# Patient Record
Sex: Female | Born: 1935 | Race: White | Hispanic: No | Smoking: Former smoker
Health system: Southern US, Community
[De-identification: ages and names within clinical notes are randomized; demographics above are authoritative.]

## PROBLEM LIST (undated history)

## (undated) DIAGNOSIS — E559 Vitamin D deficiency, unspecified: Secondary | ICD-10-CM

## (undated) DIAGNOSIS — R5383 Other fatigue: Secondary | ICD-10-CM

## (undated) DIAGNOSIS — R5381 Other malaise: Secondary | ICD-10-CM

## (undated) DIAGNOSIS — F22 Delusional disorders: Principal | ICD-10-CM

## (undated) DIAGNOSIS — M810 Age-related osteoporosis without current pathological fracture: Secondary | ICD-10-CM

## (undated) DIAGNOSIS — K5900 Constipation, unspecified: Secondary | ICD-10-CM

## (undated) DIAGNOSIS — E785 Hyperlipidemia, unspecified: Secondary | ICD-10-CM

## (undated) DIAGNOSIS — R413 Other amnesia: Secondary | ICD-10-CM

## (undated) DIAGNOSIS — M542 Cervicalgia: Secondary | ICD-10-CM

## (undated) DIAGNOSIS — S42209A Unspecified fracture of upper end of unspecified humerus, initial encounter for closed fracture: Secondary | ICD-10-CM

## (undated) DIAGNOSIS — R21 Rash and other nonspecific skin eruption: Secondary | ICD-10-CM

## (undated) DIAGNOSIS — R0602 Shortness of breath: Secondary | ICD-10-CM

## (undated) DIAGNOSIS — E039 Hypothyroidism, unspecified: Secondary | ICD-10-CM

## (undated) DIAGNOSIS — M549 Dorsalgia, unspecified: Secondary | ICD-10-CM

## (undated) DIAGNOSIS — I10 Essential (primary) hypertension: Secondary | ICD-10-CM

## (undated) DIAGNOSIS — H9190 Unspecified hearing loss, unspecified ear: Secondary | ICD-10-CM

## (undated) DIAGNOSIS — B0229 Other postherpetic nervous system involvement: Secondary | ICD-10-CM

## (undated) HISTORY — DX: Delusional disorders: F22

## (undated) HISTORY — DX: Dorsalgia, unspecified: M54.9

## (undated) HISTORY — DX: Constipation, unspecified: K59.00

## (undated) HISTORY — DX: Vitamin D deficiency, unspecified: E55.9

## (undated) HISTORY — DX: Other postherpetic nervous system involvement: B02.29

## (undated) HISTORY — DX: Other amnesia: R41.3

## (undated) HISTORY — PX: CATARACT EXTRACTION, BILATERAL: SHX1313

## (undated) HISTORY — DX: Essential (primary) hypertension: I10

## (undated) HISTORY — DX: Unspecified fracture of upper end of unspecified humerus, initial encounter for closed fracture: S42.209A

## (undated) HISTORY — DX: Other malaise: R53.81

## (undated) HISTORY — DX: Hyperlipidemia, unspecified: E78.5

## (undated) HISTORY — DX: Rash and other nonspecific skin eruption: R21

## (undated) HISTORY — DX: Age-related osteoporosis without current pathological fracture: M81.0

## (undated) HISTORY — DX: Hypothyroidism, unspecified: E03.9

## (undated) HISTORY — DX: Shortness of breath: R06.02

## (undated) HISTORY — DX: Other fatigue: R53.83

## (undated) HISTORY — DX: Cervicalgia: M54.2

## (undated) HISTORY — DX: Unspecified hearing loss, unspecified ear: H91.90

---

## 1957-10-14 HISTORY — PX: KIDNEY STONE SURGERY: SHX686

## 1989-10-14 HISTORY — PX: SPINE SURGERY: SHX786

## 1994-10-14 HISTORY — PX: SPINE SURGERY: SHX786

## 1998-01-12 ENCOUNTER — Encounter: Admission: RE | Admit: 1998-01-12 | Discharge: 1998-04-12 | Payer: Self-pay | Admitting: Orthopaedic Surgery

## 1998-10-14 HISTORY — PX: TOTAL KNEE ARTHROPLASTY: SHX125

## 1999-01-23 ENCOUNTER — Other Ambulatory Visit: Admission: RE | Admit: 1999-01-23 | Discharge: 1999-01-23 | Payer: Self-pay | Admitting: Internal Medicine

## 2000-02-22 ENCOUNTER — Other Ambulatory Visit: Admission: RE | Admit: 2000-02-22 | Discharge: 2000-02-22 | Payer: Self-pay | Admitting: Internal Medicine

## 2000-03-21 ENCOUNTER — Encounter: Payer: Self-pay | Admitting: Internal Medicine

## 2000-03-21 ENCOUNTER — Encounter: Admission: RE | Admit: 2000-03-21 | Discharge: 2000-03-21 | Payer: Self-pay | Admitting: Internal Medicine

## 2001-03-23 ENCOUNTER — Encounter: Admission: RE | Admit: 2001-03-23 | Discharge: 2001-03-23 | Payer: Self-pay | Admitting: Internal Medicine

## 2001-03-23 ENCOUNTER — Encounter: Payer: Self-pay | Admitting: Internal Medicine

## 2001-09-24 ENCOUNTER — Encounter: Admission: RE | Admit: 2001-09-24 | Discharge: 2001-11-13 | Payer: Self-pay | Admitting: Internal Medicine

## 2002-03-24 ENCOUNTER — Encounter: Payer: Self-pay | Admitting: Internal Medicine

## 2002-03-24 ENCOUNTER — Encounter: Admission: RE | Admit: 2002-03-24 | Discharge: 2002-03-24 | Payer: Self-pay | Admitting: Internal Medicine

## 2002-09-08 ENCOUNTER — Encounter: Payer: Self-pay | Admitting: Orthopedic Surgery

## 2002-09-15 ENCOUNTER — Inpatient Hospital Stay (HOSPITAL_COMMUNITY): Admission: RE | Admit: 2002-09-15 | Discharge: 2002-09-20 | Payer: Self-pay | Admitting: Orthopedic Surgery

## 2002-09-15 HISTORY — PX: TOTAL KNEE ARTHROPLASTY: SHX125

## 2003-02-28 ENCOUNTER — Other Ambulatory Visit: Admission: RE | Admit: 2003-02-28 | Discharge: 2003-02-28 | Payer: Self-pay | Admitting: Internal Medicine

## 2003-03-28 ENCOUNTER — Encounter: Admission: RE | Admit: 2003-03-28 | Discharge: 2003-03-28 | Payer: Self-pay | Admitting: Internal Medicine

## 2003-03-28 ENCOUNTER — Encounter: Payer: Self-pay | Admitting: Internal Medicine

## 2004-03-05 ENCOUNTER — Other Ambulatory Visit: Admission: RE | Admit: 2004-03-05 | Discharge: 2004-03-05 | Payer: Self-pay | Admitting: Internal Medicine

## 2004-03-28 ENCOUNTER — Encounter: Admission: RE | Admit: 2004-03-28 | Discharge: 2004-03-28 | Payer: Self-pay | Admitting: Internal Medicine

## 2004-06-02 ENCOUNTER — Emergency Department (HOSPITAL_COMMUNITY): Admission: EM | Admit: 2004-06-02 | Discharge: 2004-06-02 | Payer: Self-pay | Admitting: Emergency Medicine

## 2004-06-21 ENCOUNTER — Encounter: Admission: RE | Admit: 2004-06-21 | Discharge: 2004-06-21 | Payer: Self-pay | Admitting: Specialist

## 2004-10-14 HISTORY — PX: BACK SURGERY: SHX140

## 2005-03-20 ENCOUNTER — Inpatient Hospital Stay (HOSPITAL_COMMUNITY): Admission: RE | Admit: 2005-03-20 | Discharge: 2005-03-26 | Payer: Self-pay | Admitting: Specialist

## 2005-03-20 ENCOUNTER — Ambulatory Visit: Payer: Self-pay | Admitting: Physical Medicine & Rehabilitation

## 2005-03-20 HISTORY — PX: LAMINECTOMY: SHX219

## 2005-06-05 DIAGNOSIS — E039 Hypothyroidism, unspecified: Secondary | ICD-10-CM

## 2005-06-05 HISTORY — DX: Hypothyroidism, unspecified: E03.9

## 2005-06-27 ENCOUNTER — Encounter: Admission: RE | Admit: 2005-06-27 | Discharge: 2005-06-27 | Payer: Self-pay | Admitting: *Deleted

## 2005-09-12 DIAGNOSIS — M542 Cervicalgia: Secondary | ICD-10-CM

## 2005-09-12 HISTORY — DX: Cervicalgia: M54.2

## 2006-07-03 ENCOUNTER — Encounter: Admission: RE | Admit: 2006-07-03 | Discharge: 2006-07-03 | Payer: Self-pay | Admitting: *Deleted

## 2006-07-09 DIAGNOSIS — M810 Age-related osteoporosis without current pathological fracture: Secondary | ICD-10-CM

## 2006-07-09 HISTORY — DX: Age-related osteoporosis without current pathological fracture: M81.0

## 2007-07-07 ENCOUNTER — Encounter: Admission: RE | Admit: 2007-07-07 | Discharge: 2007-07-07 | Payer: Self-pay | Admitting: *Deleted

## 2007-11-02 DIAGNOSIS — R21 Rash and other nonspecific skin eruption: Secondary | ICD-10-CM

## 2007-11-02 DIAGNOSIS — I10 Essential (primary) hypertension: Secondary | ICD-10-CM | POA: Insufficient documentation

## 2007-11-02 DIAGNOSIS — R0602 Shortness of breath: Secondary | ICD-10-CM

## 2007-11-02 HISTORY — DX: Essential (primary) hypertension: I10

## 2007-11-02 HISTORY — DX: Shortness of breath: R06.02

## 2007-11-02 HISTORY — DX: Rash and other nonspecific skin eruption: R21

## 2008-07-07 ENCOUNTER — Encounter: Admission: RE | Admit: 2008-07-07 | Discharge: 2008-07-07 | Payer: Self-pay | Admitting: Internal Medicine

## 2008-08-29 ENCOUNTER — Emergency Department (HOSPITAL_COMMUNITY): Admission: EM | Admit: 2008-08-29 | Discharge: 2008-08-29 | Payer: Self-pay | Admitting: Emergency Medicine

## 2008-09-12 DIAGNOSIS — B0229 Other postherpetic nervous system involvement: Secondary | ICD-10-CM

## 2008-09-12 HISTORY — DX: Other postherpetic nervous system involvement: B02.29

## 2008-12-05 DIAGNOSIS — H9190 Unspecified hearing loss, unspecified ear: Secondary | ICD-10-CM

## 2008-12-05 HISTORY — DX: Unspecified hearing loss, unspecified ear: H91.90

## 2009-04-10 DIAGNOSIS — M549 Dorsalgia, unspecified: Secondary | ICD-10-CM

## 2009-04-10 HISTORY — DX: Dorsalgia, unspecified: M54.9

## 2009-07-10 DIAGNOSIS — E785 Hyperlipidemia, unspecified: Secondary | ICD-10-CM | POA: Insufficient documentation

## 2009-07-10 HISTORY — DX: Hyperlipidemia, unspecified: E78.5

## 2009-10-09 DIAGNOSIS — R5381 Other malaise: Secondary | ICD-10-CM

## 2009-10-09 HISTORY — DX: Other malaise: R53.81

## 2010-07-17 ENCOUNTER — Encounter: Admission: RE | Admit: 2010-07-17 | Discharge: 2010-07-17 | Payer: Self-pay | Admitting: Internal Medicine

## 2010-11-04 ENCOUNTER — Encounter: Payer: Self-pay | Admitting: Specialist

## 2011-03-01 NOTE — H&P (Signed)
NAME:  [name], [name] NO.:  [id number]   MEDICAL RECORD NO.:  [id number]                   PATIENT TYPE:  INP   LOCATION:  0455                                 FACILITY:  [facility]   PHYSICIAN:  Ollen Gross, M.D.                 DATE OF BIRTH:  1927-07-23   DATE OF ADMISSION:  09/15/2002  DATE OF DISCHARGE:                                HISTORY & PHYSICAL   CHIEF COMPLAINT:  Left knee pain.   HISTORY OF PRESENT ILLNESS:  The patient is a 77 year old female who has  been seen by Dr. Lequita Halt for severe left knee pain.  It has been ongoing for  four years now but progressively worse over the past two to three.  She has  undergone Synvisc a couple of times.  It worked during the first trial;  however, the second trial did not help at all.  She has reached a point  where her knee is hurting all the time and starting to interfere with her  daily activities.  It is felt she would benefit from undergoing a total knee  replacement.  The risks and benefits were discussed.  She was subsequently  admitted to the hospital.   ALLERGIES:  Multiple allergies.  She said MANY NARCOTICS cause  disorientation and combativeness.  Also, allergies include PENICILLIN,  MORPHINE, FOSAMAX, TOBRADEX, MIACALCIN, HIGH-DOSE PERCOCET.   CURRENT MEDICATIONS:  1. Levoxyl 88 mcg daily.  2. Actonel 35 mg weekly.  3. Calcium 600 + D daily.  4. Vision formula supplement with lutein and zinc and antioxidants.  5. Vitamin A.  6. Vioxx 1/8 of a tablet daily.   PAST MEDICAL HISTORY:  1. Pneumonia in 1991.  2. Reflux disease.  3. Renal calculi.  4. Hypothyroidism.  5. Osteoporosis.  6. History of metatarsal fractures.   PAST SURGICAL HISTORY:  1. Kidney stone excision in 1959.  2. Spinal fusion September 1990.  3. Wire removal from her fusion September 1991.  4. Spinal fusion in 1997.  5. Right knee replacement in 1999.   SOCIAL HISTORY:  Married.  Nonsmoker.  Two glasses of  wine with dinner.  Three children.  She has a two-story home with 15 steps.   FAMILY HISTORY:  Father and three brothers with heart disease.  Two brothers  who have had open-heart surgery.  Mother and one brother with arthritis.  She has a sister with lung cancer.   REVIEW OF SYSTEMS:  GENERAL:  No fevers, chills, or night sweats.  NEUROLOGIC:  No seizures, syncope, or paralysis.  RESPIRATORY:  No shortness  of breath, productive cough, or hemoptysis.  CARDIOVASCULAR:  No chest pain,  angina, or orthopnea.  GASTROINTESTINAL:  She does have some reflux disease.  No nausea, vomiting, diarrhea, or constipation.  GENITOURINARY:  She has had  some renal calculi, one removed back in 1959.  No dysuria, hematuria, or  discharge.  MUSCULOSKELETAL:  Pertinent to  the left knee found in the  history of present illness.   PHYSICAL EXAMINATION:  VITAL SIGNS:  Pulse 62, respirations 14, blood  pressure 138/78.  GENERAL:  The patient is a 77 year old female.  Well-nourished, well-  developed.  Appears to be in no acute distress.  She is alert and oriented  and cooperative.  HEENT:  Normocephalic, atraumatic.  Pupils are round and reactive.  EOMs are  intact.  NECK:  Supple.  CHEST:  Clear to auscultation anterior and posterior chest wall.  HEART:  Regular rate and rhythm.  No murmurs.  ABDOMEN:  Soft and nontender.  Bowel sounds present.  RECTAL, BREASTS, GENITOURINARY:  Not done.  Not pertinent to present  illness.  EXTREMITIES:  Left lower extremity, left knee range of motion of 10-120  degrees.  Slight varus alignment.  Moderate crepitus.  Positive effusion.   IMPRESSION:  1. Osteoarthritis left knee.  2. History of pneumonia in 1991.  3. Reflux disease.  4. History of renal calculi.  5. Osteoporosis.  6. Hypothyroidism.   PLAN:  The patient will be admitted to [facility] to undergo a  left total knee replacement arthroplasty.  Surgery will be performed by Dr.  Ollen Gross.     Alexzandrew L. Julien Girt, P.A.              Ollen Gross, M.D.    ALP/MEDQ  D:  09/19/2002  T:  09/19/2002  Job:  [id number]

## 2011-03-01 NOTE — Discharge Summary (Signed)
NAME:  [name], [name] NO.:  [id number]   MEDICAL RECORD NO.:  [id number]                   PATIENT TYPE:  INP   LOCATION:  0455                                 FACILITY:  [facility]   PHYSICIAN:  Ollen Gross, M.D.                 DATE OF BIRTH:  21-Dec-1926   DATE OF ADMISSION:  09/15/2002  DATE OF DISCHARGE:  09/20/2002                                 DISCHARGE SUMMARY   ADMISSION DIAGNOSES:  1. Osteoarthritis, left knee.  2. History of pneumonia in 1991.  3. Reflux disease.  4. History of renal calculi.  5. Osteoporosis.  6. Hypothyroidism.   DISCHARGE DIAGNOSES:  1. Osteoarthritis, left knee status post left total knee replacement     arthroplasty.  2. History of pneumonia in 1991.  3. Reflux disease.  4. History of renal calculi.  5. Osteoporosis.  6. Hypothyroidism.   PROCEDURE:  The patient was taken to the OR on September 15, 2002 and  underwent a left total knee replacement arthroplasty. Surgeon, Dr. Homero Fellers  Aluisio. Assistant, Avel Peace, P.A.-C. Surgery under general anesthesia  with minimal blood loss. Hemovac drain x1. Tourniquet time 45 minutes at 300  mmHg.   CONSULTATIONS:  Rehabilitation services.   BRIEF HISTORY:  The patient is a 77 year old female seen by Dr. Lequita Halt for  severe left knee pain that has been ongoing for approximately four years  now; however, it has been the worst over the past 2-3 years. She has  undergone Synvisc trials in the past. First she got a good response with the  first trial, her second Synvisc trial did not help. She has reached a point  now that her knee is hurting all the time and it started interfering with  her daily activities. She is seen in the office and found to have severe  osteoarthritis and it is felt that she would benefit from undergoing knee  replacement. The risks and benefits were discussed and she was subsequently  admitted to the hospital.   LABORATORY DATA:  CBC on admission  showed a hemoglobin of 14.2, hematocrit  of 41.8, white cell count of 6.5, red cell count 4.82. Postop H&H 10.6 and  30.9. Last noted H&H 10.7 and 31.6. Differential on the admission CBC all  within normal limits. PT and PTT on admission were 13.0 and 30 respectively  with an INR of 0.9. Serial pro times followed per Coumadin protocol. Last  noted PT and INR 20.9 and 2.0. Chem panel all within normal limits on  admission. Follow-up BMET showed an increasing glucose from 94 to 139 and  the last BMET within normal limits. Last BMET showed a mild drop in calcium  from 9.6 to 8.3. Urinalysis on admission only showed trace ketones,  otherwise, negative. Blood group type O positive.   EKG dated September 08, 2002, sinus bradycardia, otherwise, normal confirmed  by Dr. Viann Fish. Preop chest x-ray mild bronchitic changes.  HOSPITAL COURSE:  The patient was admitted to [name], taken to  the OR and underwent the above stated procedure without complication. The  patient tolerated the procedure well, later transferred to the recovery room  and then to the orthopedic floor for continued postop care. Placed  weightbearing as tolerated. Initially placed on PCA Dilaudid for pain  control. Also given 24 hours of postop IV antibiotics and placed on Coumadin  for DVT prophylaxis. Hemovac drain placed at the time of surgery and was  pulled on postoperative day one. She also had an inflow lidocaine pain pump  placed at the time of surgery. That inflow catheter was left in until  postoperative day two. She did have some mild positive fluid balance and  underwent some mild diuresis.  I&O's improved following diuresis. Day two  she had the inflow pain pump drain pulled, dressing was changed, incision  was healing well. PCA and Foley was discontinued at that time. PT and OT was  consulted postop to assist with total knee protocol gait training and  ambulation. She did very well with physical  therapy up ambulating greater  than 100 feet by postoperative day two and increased even up to 175 feet by  postoperative day three. She continued to progress very well, she was weaned  over to p.o. medications. By day five, she was doing quite well. She had  been seen in rounds by Dr. Lequita Halt on September 20, 2002, tolerating her  medications and was discharged home.   DISCHARGE PLAN:  1. The patient was discharged home on September 20, 2002.  2. For discharge diagnoses please see above.  3. Discharge medications: Coumadin, Percocet and Robaxin.  4. Diet as tolerated.  5. Activity is weightbearing as tolerated.  6. Home health PT and home health nursing.  7. Follow-up, the patient is to follow-up on Thursdays, September 30, 2002.   DISPOSITION:  Home.   CONDITION ON DISCHARGE:  Improved.     Alexzandrew L. Julien Girt, P.A.              Ollen Gross, M.D.    ALP/MEDQ  D:  10/26/2002  T:  10/26/2002  Job:  [id number]

## 2011-03-01 NOTE — Op Note (Signed)
NAME:  [name], [name] NO.:  [id number]   MEDICAL RECORD NO.:  [id number]          PATIENT TYPE:  INP   LOCATION:  5035                         FACILITY:  [facility]   PHYSICIAN:  Kerrin Champagne, M.D.   DATE OF BIRTH:  1927-07-29   DATE OF PROCEDURE:  03/20/2005  DATE OF DISCHARGE:                                 OPERATIVE REPORT   PREOPERATIVE DIAGNOSES:  1.  Lumbar spinal stenosis, L3-4, with bilateral lateral recess stenosis and      foraminal entrapment involving the L3 and L4 nerve roots with a grade 1      degenerative spondylolisthesis above a previous L4-5 posterolateral      fusion with a Isola rods and pedicle screw instrumentation.  2.  Probable left L5-S1 herniated nucleus pulposus.   POSTOPERATIVE DIAGNOSES:  1.  Lumbar spinal stenosis, L3-4, with bilateral lateral recess stenosis and      foraminal entrapment involving the L3 and L4 nerve roots with a grade 1      degenerative spondylolisthesis above a previous L4-5 posterolateral      fusion with a Isola rods and pedicle screw instrumentation.  2.  The patient was found to have a left-sided L5-S1 herniated nucleus      pulposus causing left S1 nerve root compression.   PROCEDURE:  1.  Central laminectomy at L3-4 with bilateral L3 and L4 nerve root      decompression.  2.  Removal of Isola rods and pedicle screws at the L4-5 level.  3.  Left L3-4 transverse lumbar interbody fusion using a DePuy 8-mm Leopard      cage with local and Symphony bone graft.  4.  Posterolateral fusion, L3 to L4, with combination of local and Symphony      bone graft material.  5.  Removal of Isola rods and pedicle screws at the L4-5 level, then      instrumentation posteriorly segmentally from L3 to L5 using Monarch      pedicle screws and rods.  6.  Left-sided L5-S1 microdiskectomy.   SURGEON:  Kerrin Champagne, M.D.   ASSISTANT:  Maud Deed, P.A.-C.   ANESTHESIA:  GOT.   ANESTHESIOLOGIST:  Burna Forts,  M.D.   ESTIMATED BLOOD LOSS:  250 mL; Cell Saver was used, but no blood was  returned.   DRAINS:  Foley to straight drain; Hemovac, right lower lumbar.   BRIEF CLINICAL HISTORY:  This patient is a 77 year old female who I have  treated previously for a lumbar spondylolisthesis at the L4-5 level with  decompression and fusion without instrumentation, later revised the fusion  with instrumentation.  She did well for many years and now has returned with  increasing back pain with radiation to the left leg, pain with both standing  and walking, pain with sitting, bending or stooping, sciatic tension signs  as well as claudication signs.  Her x-rays have demonstrated a  spondylolisthesis above the previous fusion site at the L3-4 level, above  the previous L4-5 level fusion site.  Her studies have shown a finding of  entrapment of the L3 nerve roots secondary to  spondylolisthesis as well as  lateral recess stenosis, though severe at the L3-4 level above the previous  fusion site, L4-5.  On reviewing her myelogram/post myelogram CT scan, it is  apparent that she may very well have a disk herniation at the L5-S1 level on  the left side.  Her pain pattern is an S1 distribution when she is sitting  or when her leg is raised on the left side.  She has diminished left ankle  jerk, but then neurogenic claudication with standing and ambulating,  indicating that she also has problems relative to the spondylolisthesis  above her previous fusion site.   INTRAOPERATIVE FINDINGS:  Left L5-S1 HNP with left S1 nerve root  compression.  Spondylolisthesis with bilateral L3 nerve root entrapment of  bilateral L4 nerve root entrapment at the L3-4 level.  Solid fusion, L4-5.   DESCRIPTION OF PROCEDURE:  After adequate general anesthesia, the patient in  a prone position, a Jackson frame was used.  Standard preoperative  antibiotics, all pressure points well-padded, chest rolls were used and  Foley catheter  was placed prior to the patient being returned to a prone  position.  TED hose to prevent DVT.  The patient with previous total knee  arthroplasties and for this reason, also to restore lordosis of the lumbar  spine, the Andrews frame was not used.  Cell Saver was used during the  procedure; the patient had sterile withdrawal of blood, 60 mL, for the  Symphony bone graft procedure by the anesthesiologist.  Standard prep with  DuraPrep solution from lower dorsal spine to the mid-sacral levels, draped  in the usual manner; iodine Vi Drape was used.  Incision made ellipsing the  old incision scar through the skin and subcutaneous layers down to the L2-L3  spinous processes.  Also, incision made down to the spinous process of L5  and S1.  Cobbs were used to elevate the paralumbar muscles off of the  posterior aspect the elements of L3 and L2, and care was taken not to enter  the midline defect at the L4-5 level, finding the lateral aspects of the  posterior elements at the L4-5 level.  Identifying the hardware at the L4-5  level laterally of the Isola pedicle screws and rods.   Attention then turned to removal of hardware initially and this was done by  first loosening the cap screws on each of the Isola closed screw system;  this was done by loosening the screws on the head of each of these closed  screws.  Once this was completed, then the rods were then split through the  opening, leaving the remaining screws.  Each of the screws were then removed  and measured for their size and length.  At the L4 level, the screw length  was 45 mm on both sides with a size of the screw at 6.0 mm.  On the left  side at the L5 level, a 5.5 screw was used and on the right side at the L5  level, a 6.0 screw was used, again, the depth of 40 mm at both levels at the  5 level was measured.  Each of these screws was increased by an additional size for fixation purposes; 6.25 screws were used at the L4 level   bilaterally, 6.25 on the right side and on the left side, a 6.25 screw also  used.  These screws were then replaced with Monarch pedicle screws that had  an open head with variable axis  fastener.  Once these were then placed,  attention was turned to performing a central decompression at the L3-4 level  where the spinous process of L3 was resected, then bone removed and  preserved for later use for the TLIF as well as for the posterolateral  fusion.  The central portions the lamina were then resected using osteotomes  and curettes, again preserving the bone or using it later for local bone  graft as well as for incorporation in Symphony bone graft material.  The  patient had the bilateral lateral recess decompression using osteotomes to  resect the medial portion of the facet at the L3-4 level over about 25%;  this decompressed quite nicely the L4 nerve roots on both sides, a 3-mm  Kerrison used to carefully debride the lateral recess and resect portions of  the pars that were residual as well as lateral recess over the L4 nerve  roots on both sides such that a hockey-stick neural probe could be passed  out the neural foramen of L4, both sides at the L4-5 level and noting its  decompression.  The superior articular process of L4 at the L3-4 level was  resected, decompressing the bilateral foramen and the left side facet was  completely excised in order to allow for a left-sided TLIF to be performed.  Bilateral L3 nerve roots were well-decompressed at the end of the procedure.  The laminotomy was carried up to the L2-3 level where the ligamentum flavum  was debrided and lateral recess was decompressed such that no further  compression was evident, either L2-3 or L3-4.  Attention was then turned to  the L5-S1 level on the left side, where a small portion of the inferior  aspect of the lamina of L5 was resected and a 3-mm Kerrison used to debride  again a further small amount of the inferior  aspect of the left L5 lamina.  The ligament flavum was then resected on the left side and medial facet of  L5-S1 was resected over about 5%, foraminotomy performed over the S1 nerve  root, the operating room microscope draped and brought into the field.  Under direct visualization then, the S1 nerve root identified.  Small  bleeders were cauterized and small veins that were tethering the S1 nerve  root to the protruded disk ventral to the nerve root were carefully freed up  and the nerve root and thecal sac able to be retracted medially.  Disk  protrusion was noted immediately to be present, a 15 blade scalpel used to  incise the posterior longitudinal ligament and posterior annulus on the left  side at L5-S1 and large amount of disk herniation material was then removed  from the subligamentous region and from the disk space posteriorly.  This  was impressing on the ventral aspect of the thecal sac on the left side and the S1 nerve root.  Due to tethering veins, the S1 nerve root was fairly  well-tethered over this disk protrusion, preventing it from moving away.  Once the disk protrusion was resected, irrigation was performed.  The nerve  root appeared to exit without any further root compression.  Posterior  aspect of the disk space demonstrated no further impression on the thecal  sac.  The L5 nerve root appeared to be exiting without difficulty when  probed with a hockey-stick neural probe.  Gelfoam, thrombin-soaked, was  placed in lateral recess here and posteriorly, and attention then returned  to the L3-4 level.  Here, distraction of  the L3-4 disk space was obtained by  initially incising the disk, protecting the thecal sac at the L3-4 level on  the left side as well as the L3 nerve root, incising the disk with a 15  blade scalpel, then using a Penfield 4 to identify the disk space, pituitary  used to debride and light disk material present, the disk space then dilated  with an 8-mm  and 7-mm dilator.  A Kocher clamp placed on the spinous process  of L2 was able to be used to help distract the disk space further.  Because  the patient had a history of osteoporosis, we did not desire to use any type  of the distraction device on pedicle screws or even to use a laminar  spreader.  Lamina spreader could not be used as the lamina were not intact.  With this then, we were able to dilate the disk space and then excise disk  from the disk space using pituitaries, upbiting, straight pituitaries as  well as pituitaries provide on the DePuy Monarch pedicle screws and Leopard  set.  Curettage was performed of the endplates, removing any cartilaginous  endplates with the ring curettes.  This completed and the patient had a  trial implant placed within the TLIF area, first a 7-mm which provided good  fit, but it was felt that an 8-mm, only a single millimeter more, could also  be placed within the disk space as determined by the C-arm fluoro.  The C-  arm fluoro was used throughout the case and indeed, it was used with the  insertion of the new pedicle screws, but also it was used with the placement  of the TLIF.  The 8-mm TLIF trial was performed and this was done without  difficulty and provided better distraction of the disk space and better fit  within the disk space.  With this, it was determined the use an 8-mm Leopard  cage.  Bone graft material that was a combination of Symphony and local bone  graft material was placed within the intervertebral disk space and this was  impacted using the kicker impactors used normally to turn the Leopard cage,  but also the trial 7-mm Leopard cage was used to impact the bone graft  within the disk space.  This tended t o open the disk space further to allow  for the 8-mm cage to be placed with less difficulty and injury.  Again,  using the Kocher clamp at the spinous process of L2, retracting the thecal sac and the L3 nerve root, the Leopard  cage filled with local bone graft  material was then impacted into place; it was subset beneath the posterior  aspect of the disk space about 3-4 mm and carefully rotated using the kicker  impactors into a transverse position.  This completed placement of the  Leopard cage and C-arm fluoro was used to ascertain its position and  alignment and was felt to be in good position and alignment.  Carefully, the  transverse processes were exposed at the L3 level bilaterally and soft  tissue removed off this, the area of the intersection of the superior  articular process of L3, and the pedicle of the transverse process was  carefully identified.  An awl was used to make an entry port in the  midportion of the transverse process at its intersection with the lateral  aspect of pedicle and the superior articular process of L3 and then a  gearshift pedicle finder  probe was then inserted; using C-arm fluoro, this  was carefully guided down the pedicle on the left side.  A ball-tipped probe  used to probe the pedicle and probed freely without any evidence of  penetration of the pedicle, medial or lateral.  A screw measuring 5.5 was  chosen, as the patient had a quite small pedicle on both CT scan and MRI.   With this in mind then, tapping was performed using a 4.75 tap and then a  5.5 screw by 45 mm in length inserted and screwed into place; this was done  following decortication of the transverse process and application of  Symphony bone graft material along the transverse process.  Decortication of  the pars region of the L3 vertebral as well as the bone graft bed over the  superior aspect of the L4-5 posterolateral fusion.  This was done using a  high-speed bur.  Bone graft was then inserted and the screw placed without  difficulty.  Attention turned to the right side, where similarly the L3  pedicle screw was placed, again exposing the transverse process,  decorticating, then using an awl to enter  into the internal aspect of the  pedicle at the intersection of the midportion of the transverse process with  the superior articular process of L3.  Then using a pedicle probe to probe  the pedicle, probed to 45 mm.  Again a 5.5 screw was to be used with 45-mm  length.  Tapping with the 4.75 tap and then placing the screw following  decortication of both the lateral aspect of the pars region, the superior  portion of the previous bone graft bed at the L4-5 level posterolaterally  and superiorly, and over the transverse process, and Symphony bone graft was  then placed out here and the screw placed into the pedicle.  This completed  placement of the screws at the L3, L4 and L5 levels.  These were measured  for soft tissue resistance using the intraoperative monitoring system, which  was used throughout the case for EMG purposes as well as for testing the  pedicle screw insertion.  With this, the pedicle screws were tested at the L3 level and they tested 47 on the right, 49 on left, at the L4 level on the  left side, 27, and on the right side, a 39, and at the L5 level on the left  side, it tested at 39 and on the right side, 21.  All the screws were felt  to be in excellent position and alignment, based on their soft tissue  resistance readings as well as intraoperative C-arm fluoro.  Eighty-five-  millimeter curved rods were then placed into the fasteners of the screw head  after they were first loosened using the fastener breakers.  These rods were  then carefully placed into the fasteners and on the left side, the fastener  caps were easily placed freehand.  On the right side, the central cap  required the use of the persuader in order to apply the pressure to allow  for the cap to be placed and the rod to seat within the fastener the screw.  This completed then, the lowest screws at L5 and L4 were then tightened to  80 foot pounds.  At the L3 level, because the anti-torque handle  prevented  compression across the fastener heads, as they were so close together, the  anti-torque handle was not used, as the patient had 2 screws inferiorly that  I  had tightened and would prevent any torsional changes in the configuration  of the rod.  With this then, compression was obtained between the fastener  head of the screw at L4 and that of L3 on the left side first and then the  fastener cap tightened to 80 foot pounds.  Similarly, this was done on the  right side.  Intraoperative C-arm fluoro demonstrated the screws in  excellent position and alignment with the TLIF at the L3-4 level.  Restoration of the lordotic curve of the lumbar spine was complete.  Gelfoam  was removed at the L5-S1 level on the left side.  No further Gelfoam was  placed.  Inspection of the nerve roots at L3 and L4 with a hockey-stick  neural probe demonstrated that they were freely exiting the neural foramen  at the L3 level and at the L4 level following the compression posteriorly at  this segment and the use of the TLIF.  This completed the surgical  procedure. Irrigation was performed.  Note that with removal of the pedicle  screw that was an Isola screw on the right side at L5, this required  subcutaneous dissection superficial to the fascial layer of the lower lumbar  area on the right side.  A stab incision was made with a 10 blade scalpel  and with finger dissection, the area probed.  Because of the degree of  convergence of L5 pedicle screw on the right, this required the use of the  removal device to be inserted through this percutaneous root.  Additionally,  the Monarch pedicle screw was inserted also through this percutaneous-type  approach in the subcutaneous plane.  A Hemovac drain was placed in the depth  of the incision, exiting over the right lower lumbar area where she had had  a bone graft from previous surgeries.  The patient then had Gelfoam placed  over the laminotomy defect at the  posterior aspect of L3-4.  The patient's paralumbar muscles were carefully approximated in the midline with  interrupted #1 Vicryl sutures after application of platelet-poor plasma to  the incision to obtain further hemostasis.  The lumbodorsal fascia then was  reattached to the spinous processes of the sacrum and the upper lumbar area  with interrupted #1 Vicryl sutures and also reattached to itself in the  midline with interrupted #1 Vicryl sutures, deep subcu layers approximated  with interrupted 0 and 2-0 Vicryl sutures and the skin closed with a running  subcu stitch of 4-0 Vicryl, tincture of Benzoin and Steri-Strips applied, 4  x 4's and ABD pad affixed to the skin with Hypafix tape.  The patient was  then returned to a supine position, reactivated, extubated and returned to  the recovery room in satisfactory condition.  All instrument and sponge  counts were correct.       JEN/MEDQ  D:  03/22/2005  T:  03/23/2005  Job:  [id number]

## 2011-03-01 NOTE — Discharge Summary (Signed)
NAME:  [name], [name] NO.:  [id number]   MEDICAL RECORD NO.:  [id number]          PATIENT TYPE:  INP   LOCATION:  5035                         FACILITY:  [facility]   PHYSICIAN:  Kerrin Champagne, M.D.   DATE OF BIRTH:  Aug 11, 1927   DATE OF ADMISSION:  03/20/2005  DATE OF DISCHARGE:  03/26/2005                                 DISCHARGE SUMMARY   ADMISSION DIAGNOSIS:  1.  Lumbar spinal stenosis L3-4 with bilateral lateral recess stenosis and      foraminal entrapment involving the L3 and L4 nerve roots with a grade 1      degenerative spondylolisthesis above a previous L4-5 posterolateral      fusion with an Isola rod and pedicle screw instrumentation.  2.  Left L5-S1 herniated nucleus pulposus causing left S1 nerve root      compression.  3.  Hypothyroidism.  4.  Gastroesophageal reflux disease.  5.  Chronic constipation.  6.  Osteoporosis.  7.  Status post bilateral total knee arthroplasties.  8.  History of nephrolithiasis.   DISCHARGE DIAGNOSIS:  1.  Lumbar spinal stenosis L3-4 with bilateral lateral recess stenosis and      foraminal entrapment involving the L3 and L4 nerve roots with a grade 1      degenerative spondylolisthesis above a previous L4-5 posterolateral      fusion with an Isola rod and pedicle screw instrumentation.  2.  Left L5-S1 herniated nucleus pulposus causing left S1 nerve root      compression.  3.  Hypothyroidism.  4.  Gastroesophageal reflux disease.  5.  Chronic constipation.  6.  Osteoporosis.  7.  Status post bilateral total knee arthroplasties.  8.  History of nephrolithiasis.  9.  Posthemorrhagic anemia.  10. Hypokalemia treated with supplementation.   PROCEDURE:  On March 20, 2005 the patient underwent:  1.  Central laminectomy L3-4 with bilateral L3 and L4 nerve root      decompression.  2.  Removal of Isola rods and pedicle screws at the L4-5 level.  3.  Left L3-4 transverse lumbar interbody fusion using a Dupuy 8 mm  Leopard      cage with local and Symphony bone graft.  4.  Posterolateral fusion L3-L4 with combination of local and Symphony bone      graft material.  5.  Instrumentation posterolaterally segmentally from L3-L5 using Monarch      pedicle screws and rods.  6.  Left-sided L5-S1 microdiskectomy performed by Dr. Otelia Sergeant, assisted by      Maud Deed PA-C under general anesthesia.   CONSULTATIONS:  [facility] Dr. Thomasena Edis.   BRIEF HISTORY:  The patient 77 year old female with previous lumbar  surgeries including decompression and fusion without instrumentation for L4-  5 spondylolisthesis. She has done well for several years but now has  complaints of pain with standing and walking as well as sitting, bending and  stooping. She has signs of claudication and positive sciatic tension signs  on examination. X-rays have demonstrated spondylolisthesis above the  previous fusion site of the L3-4 level. Other studies have shown signs of  entrapment of the L3 nerve root secondary to the spondylolisthesis at the L3-  4 level. Myelogram and post myelogram CT also are suggestive of an L5-S1  herniated nucleus pulposus. On examination she does have S1 distribution  symptoms on the left. It was felt she would require surgical intervention  and was admitted for the procedure as stated above.   BRIEF HOSPITAL COURSE:  The patient tolerated the procedure under general  anesthesia without complications. Postoperatively the patient had  neurovascular motor function of the lower extremities intact but continued  to complain of left leg pain in S1 distribution. Physical therapy was  initiated for ambulation and gait training. She did have difficulty with  initiating physical therapy secondary to pain and complaints of dizziness. A  rehab consult was obtained as she was felt to possibly need extensive  inpatient rehabilitation. She was monitored throughout the remainder of  the  hospital stay and eventually did better with her activity level. She  eventually was not deemed a suitable candidate as she was walking in the  hallway and tolerating physical therapy much better. Her Hemovac drain was  discontinued on the first postoperative day. Dressing changes were done  daily thereafter without signs of infection noted at the wound. The patient  had abdominal distension and a probable early ileus. She was known to have  significant chronic constipation. Multiple agents were used and the patient  eventually was able to have a bowel movement after use of magnesium citrate  and placed back on her usual regimen with stool softeners. The patient had  difficulty with fitting of her brace. [employer] was consulted to assist with  her brace. She complained of stiffness of her neck and physical therapy  assisted by helping her with stretching exercises. Heat packs and ice packs  were also utilized. The patient had mild hypokalemia treated with oral  supplementation. Eventually her Foley catheter was discontinued and she was  able to void without difficulty. She also was able to have regular bowel  movements prior to discharge. The patient received 2 units of packed red  blood cells postoperatively for her posthemorrhagic anemia.   LABORATORY DATA:  Pertinent laboratory values include admission CBC with WBC  10.5, hemoglobin 12.6 and hematocrit 38.0. The patient's hemoglobin dropped  to 9.5 with hematocrit 28.0. She was symptomatic with mild tachycardia and  decreased blood pressure as well as some subjective complaints of dizziness.  Transfusion was indicated. Post transfusion the patient's hemoglobin  stabilized to 11.1 with hematocrit 33.2. Coagulation studies on admission  were within normal limits. Chemistry studies on admission were all within  normal limits. BMETs were monitored daily and potassium dropped to 3.3. Treatment with supplementation brought it back to  normal value. Urinalysis  on admission was negative for urinary tract infection.   The EKG on admission with sinus bradycardia, otherwise normal EKG. No  significant change since last tracing confirmed by Dr. Peter [place].   Chest x-ray on March 20, 2005 showed right central line tip is directed  laterally at the level of lower superior vena cava, cardiomegaly and  congestive heart failure. No repeat studies noted in this chart. Of note is  that the patient did not have any difficulty with her central line and it  was removed postoperatively without difficulty.   PLAN:  The patient was discharged to home. Arrangements were made for her to  have home health physical therapy as well as occupational therapy. This was  provided by [place]. Durable  medical equipment made available as needed. She  was instructed to use ice to her back and to avoid bending or twisting. No  lifting over 2 pounds. The patient was instructed on changing her dressing  daily. She will be allowed to shower as long as there is no drainage from  her wound.   INSTRUCTIONS:  For bracing when walking or sitting was given to the patient.  She was advised she was able to remove the brace when she was lying down.  Donning and doffing the brace seated at bedside was felt to be appropriate.   DISCHARGE MEDICATIONS:  1.  The patient was given prescriptions for Vicodin one to two every 4 to 6      hours as needed for pain  2.  Robaxin 500 milligrams one every 8 hours as needed for spasm.  3.  She was instructed to resume all home medications and use over-the-      counter stool softener at least twice daily and laxatives as needed.   The patient was given specific instructions to avoid any ibuprofen or  aspirin products. She will follow up with Dr. Otelia Sergeant 2 weeks from the date of  her surgery and was given instructions to call the office to arrange the  time.   CONDITION ON DISCHARGE:  Stable      Wende Neighbors,  P.A.      Kerrin Champagne, M.D.  Electronically Signed    SMV/MEDQ  D:  06/05/2005  T:  06/05/2005  Job:  [id number]

## 2011-03-01 NOTE — Op Note (Signed)
NAME:  [name] NO.:  [id number]   MEDICAL RECORD NO.:  [id number]                   PATIENT TYPE:  INP   LOCATION:  0003                                 FACILITY:  [facility]   PHYSICIAN:  Ollen Gross, M.D.                 DATE OF BIRTH:  [birth year]   DATE OF PROCEDURE:  09/15/2002  DATE OF DISCHARGE:                                 OPERATIVE REPORT   PREOPERATIVE DIAGNOSIS:  Osteoarthritis, left knee.   POSTOPERATIVE DIAGNOSIS:  Osteoarthritis, left knee.   PROCEDURE:  Left total knee arthroplasty.   SURGEON:  Ollen Gross, M.D.   ASSISTANT:  Avel Peace, P.A.-C.   ANESTHESIA:  General.   ESTIMATED BLOOD LOSS:  Minimal.   DRAINS:  Hemovac x1.   COMPLICATIONS:  None.   TOURNIQUET TIME:  45 minutes at 300 mmHg.   CONDITION:  Stable to recovery.   BRIEF CLINICAL NOTE:  [name] is a 77 year old female who has severe  osteoarthritis of the left knee with pain refractory to nonoperative  management. She presents now for left total knee arthroplasty.   DESCRIPTION OF PROCEDURE:  After successful administration of general  anesthetic, a tourniquet was placed high on the left thigh and left lower  extremity prepped and draped in the usual sterile fashion. Extremity was  wrapped in Esmarch, knee flexed, tourniquet inflated to 300 mmHg. A midline  incision was made with a 10 blade through subcutaneous tissue to the level  of the extensor mechanism. A fresh blade was used to make a medial  parapatellar arthrotomy and the soft tissue over the proximal medial tibia  subperiosteally elevated to the joint line with a knife and into the  semimembranosus bursa with a curved osteotome. The soft tissue over the  proximal lateral tibia was also elevated with attention being paid to  avoiding the patellar tendon on the tibial tubercle. The patella was then  everted, knee flexed to 90 degrees and ACL and PCL removed.   A drill was used to  create a starting hole in the distal femur and the canal  was irrigated. A 5 degree left valgus alignment guide placed and referencing  off the posterior condyles, rotations marked and block pinned to remove 9 mm  off the distal femur. Distal femoral resection is made with an oscillating  saw. The sizing block is placed and she is in between size 3 and 4. From a  medial lateral standpoint, she was a much better three but from an AP  standpoint she was closer to a 4. We decided to mark the block with the size  4 holes. The rotations marked with the epicondylar axis. We then used a size  3 cutting block to make the anterior and posterior cuts. Those cuts are  subsequently made. The tibia has been subluxed forward and the menisci are  removed. The extramedullary tibial alignment guide is placed referencing  proximally at  the medial aspect of the tibial tubercle and distally along  the second metatarsal axis and tibial crest. Block is pinned to remove 10 mm  off the nondeficient lateral side. I put less posterior slope than usual  given that we had to take more posterior condyle given with the size 3  instead of size 4 on the femoral side. The tibia resection was made with an  oscillating saw and the size 3 trial placed. We had excellent fit. We went  ahead and prepared the proximal tibia with the modular drill and keel punch.  The flexion extension gaps measured equally after the tibial cut. The trial  was then placed. On the femoral side, we then placed the block for the  chamfer and intercondylar cuts. Those cuts subsequently made and the size 3  posterior stabilized femoral trials placed. A size 3 mobile bearing tibial  tray trial with a 10 mm posterior stabilized rotating platform insert. Full  extension was achieved with excellent varus and valgus balance throughout.  The patella was then everted, thickness measured to be 22 mm, free hand  resection taken to 12 mm, 35 template placed. The  lug holes were drilled,  trial patella placed and tracks normally.   The osteophytes were then removed off the posterior femur with the trial in  place. All trials were then removed and the cut bone surfaces prepared with  pulsatile lavage. Cement was mixed and once ready for implantation, the size  3 mobile bearing tibial tray, the size 3 posterior stabilized femoral  component and 35 patella were all cemented into place and patella was held  with a clamp. Trial 10 mm insert was placed and knee held in full extension,  all extruded cement removed. Once the cement was hardened, the permanent 10  mm posterior stabilized rotating platform insert is placed into the tibial  tray. She had excellent varus and valgus stability throughout full range of  motion. The wound was copiously irrigated and the extensor mechanism closed  over a Hemovac drain with interrupted #1 PDS. Flexion against gravity was  135 degrees. The tourniquet was then released with a total time of 45  minutes and minor bleeding stopped with cautery. The subcu was closed with  interrupted 2-0 Vicryl. The catheter for the Stryker pain pump is then  placed into the subcutaneous tissues. We then did a subcuticular closure  with a running 4-0 Monocryl. The incision was cleaned and dried and Steri-  Strips and bulky sterile dressing applied. The pump was set up to deliver  the 0.25% Marcaine per the pump parameters. The bulky sterile dressing was  applied and then the knee immobilizer placed. The patient awakened and  transported to recovery in stable condition.                                                Ollen Gross, M.D.    FA/MEDQ  D:  09/15/2002  T:  09/15/2002  Job:  [id number]

## 2011-06-10 ENCOUNTER — Other Ambulatory Visit: Payer: Self-pay | Admitting: Internal Medicine

## 2011-06-10 DIAGNOSIS — Z1231 Encounter for screening mammogram for malignant neoplasm of breast: Secondary | ICD-10-CM

## 2011-07-16 LAB — POCT CARDIAC MARKERS
CKMB, poc: 1
Myoglobin, poc: 61
Troponin i, poc: <0.05

## 2011-07-16 LAB — SEDIMENTATION RATE: Sed Rate: 18

## 2011-07-23 ENCOUNTER — Ambulatory Visit
Admission: RE | Admit: 2011-07-23 | Discharge: 2011-07-23 | Disposition: A | Discharge status: Home / Self Care | Payer: Medicare Other | Source: Ambulatory Visit | Attending: Internal Medicine | Admitting: Internal Medicine

## 2011-07-23 DIAGNOSIS — Z1231 Encounter for screening mammogram for malignant neoplasm of breast: Secondary | ICD-10-CM

## 2011-09-09 ENCOUNTER — Emergency Department (HOSPITAL_COMMUNITY): Payer: Medicare Other

## 2011-09-09 ENCOUNTER — Emergency Department (HOSPITAL_COMMUNITY)
Admission: EM | Admit: 2011-09-09 | Discharge: 2011-09-10 | Disposition: A | Discharge status: Home / Self Care | Payer: Medicare Other | Attending: Emergency Medicine | Admitting: Emergency Medicine

## 2011-09-09 DIAGNOSIS — S62309A Unspecified fracture of unspecified metacarpal bone, initial encounter for closed fracture: Secondary | ICD-10-CM | POA: Insufficient documentation

## 2011-09-09 DIAGNOSIS — S0003XA Contusion of scalp, initial encounter: Secondary | ICD-10-CM | POA: Insufficient documentation

## 2011-09-09 DIAGNOSIS — Y9210 Unspecified residential institution as the place of occurrence of the external cause: Secondary | ICD-10-CM | POA: Insufficient documentation

## 2011-09-09 DIAGNOSIS — M25569 Pain in unspecified knee: Secondary | ICD-10-CM | POA: Insufficient documentation

## 2011-09-09 DIAGNOSIS — S62307A Unspecified fracture of fifth metacarpal bone, left hand, initial encounter for closed fracture: Secondary | ICD-10-CM

## 2011-09-09 DIAGNOSIS — S060X9A Concussion with loss of consciousness of unspecified duration, initial encounter: Secondary | ICD-10-CM | POA: Insufficient documentation

## 2011-09-09 DIAGNOSIS — W19XXXA Unspecified fall, initial encounter: Secondary | ICD-10-CM

## 2011-09-09 DIAGNOSIS — S0083XA Contusion of other part of head, initial encounter: Secondary | ICD-10-CM | POA: Insufficient documentation

## 2011-09-09 DIAGNOSIS — R51 Headache: Secondary | ICD-10-CM | POA: Insufficient documentation

## 2011-09-09 DIAGNOSIS — W108XXA Fall (on) (from) other stairs and steps, initial encounter: Secondary | ICD-10-CM | POA: Insufficient documentation

## 2011-09-09 DIAGNOSIS — S060XAA Concussion with loss of consciousness status unknown, initial encounter: Secondary | ICD-10-CM | POA: Insufficient documentation

## 2011-09-09 NOTE — ED Notes (Signed)
Pt fell down concrete steps and injured her left hand and right knee, she also bumped to top of her head on the step, no bleeding

## 2011-09-10 NOTE — ED Provider Notes (Signed)
History     CSN: [id number] Arrival date & time: 09/09/2011  8:30 PM   First MD Initiated Contact with Patient 09/10/11 0136      Chief Complaint  Patient presents with  . Fall  . Knee Pain  . Hand Pain    HPI  History provided by the patient and her daughter. She reports being at her assisted-living home and coming down the main entrance concrete steps during the evening time. She states it was dark outside and the lites have not come on yet. She missed a step and this caused her to fall down several other stairs. Patient denies having a loss of consciousness. She did hit the top of her head and has some soreness and swelling. Patient also reports pain in left hand. Patient denies other symptoms. Pain is worse with palpation and movement of hand. She denies chest pain, shortness of breath, neck pain, abdominal pain, hip pain. She has been ambulatory. Patient has significant history of prior back surgeries.  Past Medical History  Diagnosis Date  . Thyroid disease     Past Surgical History  Procedure Date  . Back surgery   . Joint replacement     History reviewed. No pertinent family history.  History  Substance Use Topics  . Smoking status: Not on file  . Smokeless tobacco: Not on file  . Alcohol Use: No    OB History    Grav Para Term Preterm Abortions TAB SAB Ect Mult Living                  Review of Systems  Respiratory: Negative for shortness of breath.   Cardiovascular: Negative for chest pain.  Gastrointestinal: Negative for abdominal pain.  Neurological: Negative for dizziness, speech difficulty, weakness, numbness and headaches.  All other systems reviewed and are negative.    Allergies  Codeine; Darvocet; Penicillins; Percocet; Sulfa antibiotics; Sulfites; and Tobradex  Home Medications   Current Outpatient Rx  Name Route Sig Dispense Refill  . LEVOTHYROXINE SODIUM 88 MCG PO TABS Oral Take 88 mcg by mouth daily.        There were no vitals  taken for this visit.  Physical Exam  Nursing note and vitals reviewed. Constitutional: She is oriented to person, place, and time. She appears well-developed and well-nourished.  HENT:  Head: Normocephalic.  Right Ear: External ear normal.  Left Ear: External ear normal.       No battle sign or raccoon eyes.  Small hematoma to the top of the head. No abrasion or bleeding. No step-offs  Eyes: Conjunctivae and EOM are normal. Pupils are equal, round, and reactive to light.  Neck: Normal range of motion. Neck supple. No tracheal deviation present.  Cardiovascular: Normal rate and regular rhythm.   Pulmonary/Chest: Effort normal and breath sounds normal. No stridor. She has no wheezes. She has no rales. She exhibits no tenderness.  Abdominal: Soft. Bowel sounds are normal. She exhibits no distension. There is no tenderness. There is no rebound and no guarding.  Musculoskeletal: Normal range of motion.       Cervical back: Normal. She exhibits no bony tenderness.       Thoracic back: Normal. She exhibits no bony tenderness.       Lumbar back: Normal. She exhibits no bony tenderness.       Tenderness to palpation over left fifth metacarpal with mild swelling and ecchymoses. Normal sensation and cap refill in fingers. Range of motion of fingers and wrists.  Neurological: She is alert and oriented to person, place, and time. No cranial nerve deficit.       Normal gait  Skin: Skin is warm. No rash noted.  Psychiatric: She has a normal mood and affect. Her behavior is normal.    ED Course  Procedures (including critical care time)  Labs Reviewed - No data to display Ct Head Wo Contrast  09/09/2011  *RADIOLOGY REPORT*  Clinical Data: Status post fall; head pain.  CT HEAD WITHOUT CONTRAST  Technique:  Contiguous axial images were obtained from the base of the skull through the vertex without contrast.  Comparison: None.  Findings: There is no evidence of acute infarction, mass lesion, or intra-  or extra-axial hemorrhage on CT.  Prominence of the ventricles and sulci reflects mild cortical volume loss.  Mild cerebellar atrophy is noted.  Slight asymmetric prominence of the left lateral ventricle likely remains within normal limits.  The brainstem and fourth ventricle are within normal limits.  The basal ganglia are unremarkable in appearance.  The cerebral hemispheres are symmetric in appearance, with normal gray-white differentiation.  No mass effect or midline shift is seen.  There is no evidence of fracture; visualized osseous structures are unremarkable in appearance.  The orbits are within normal limits. The paranasal sinuses and mastoid air cells are well-aerated.  No significant soft tissue abnormalities are seen.  IMPRESSION:  1.  No evidence of traumatic intracranial injury or fracture. 2.  Mild cortical volume loss noted.  Original Report Authenticated By: Tonia Ghent, M.D.   Dg Knee Complete 4 Views Right  09/09/2011  *RADIOLOGY REPORT*  Clinical Data: Fall.  RIGHT KNEE - COMPLETE 4+ VIEW  Comparison: None.  Findings: A right knee arthroplasty shows anatomic alignment. There is no evidence of fracture or dislocation.  No evidence of significant joint effusion.  IMPRESSION: No acute fracture.  Normal alignment of right knee arthroplasty.  Original Report Authenticated By: Reola Calkins, M.D.   Dg Hand Complete Left  09/09/2011  *RADIOLOGY REPORT*  Clinical Data: Fall with left hand injury.  LEFT HAND - COMPLETE 3+ VIEW  Comparison: None.  Findings: There is a nondisplaced fracture involving the distal fifth metacarpal.  No other acute injuries identified.  Diffuse advanced osteoarthritis noted throughout the hand and wrist.  Soft tissues are unremarkable.  IMPRESSION: Nondisplaced fracture of the distal fifth metacarpal.  Original Report Authenticated By: Reola Calkins, M.D.     1. Fall   2. Hematoma of scalp   3. Concussion   4. Fracture of fifth metacarpal bone of left  hand       MDM  1:30 AM patient seen and evaluated. Patient in no acute distress.        Phill Mutter [place], [place] 09/11/11 [phone]

## 2011-09-11 NOTE — ED Provider Notes (Signed)
Medical screening examination/treatment/procedure(s) were performed by non-physician practitioner and as supervising physician I was immediately available for consultation/collaboration.   Lyanne Co, MD 09/11/11 [phone]

## 2012-02-01 IMAGING — CR DG KNEE COMPLETE 4+V*R*
4 series · 4 of 4 positions shown · non-contrast
Comparison: None.

CLINICAL DATA: Fall.

RIGHT KNEE - COMPLETE 4+ VIEW

[t knee ap right]
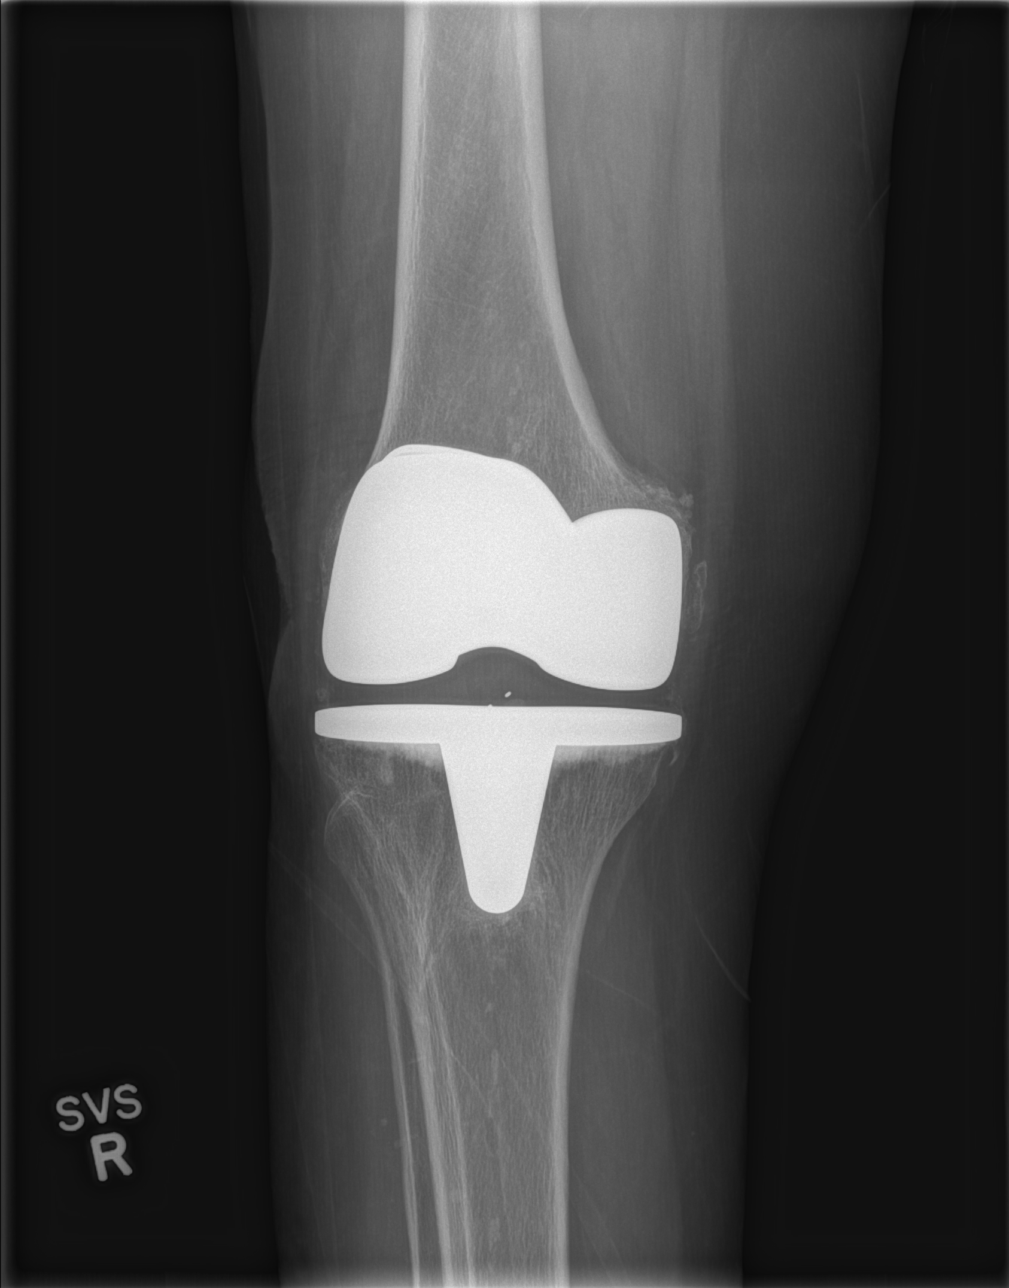

[t knee obl right (1 of 2)]
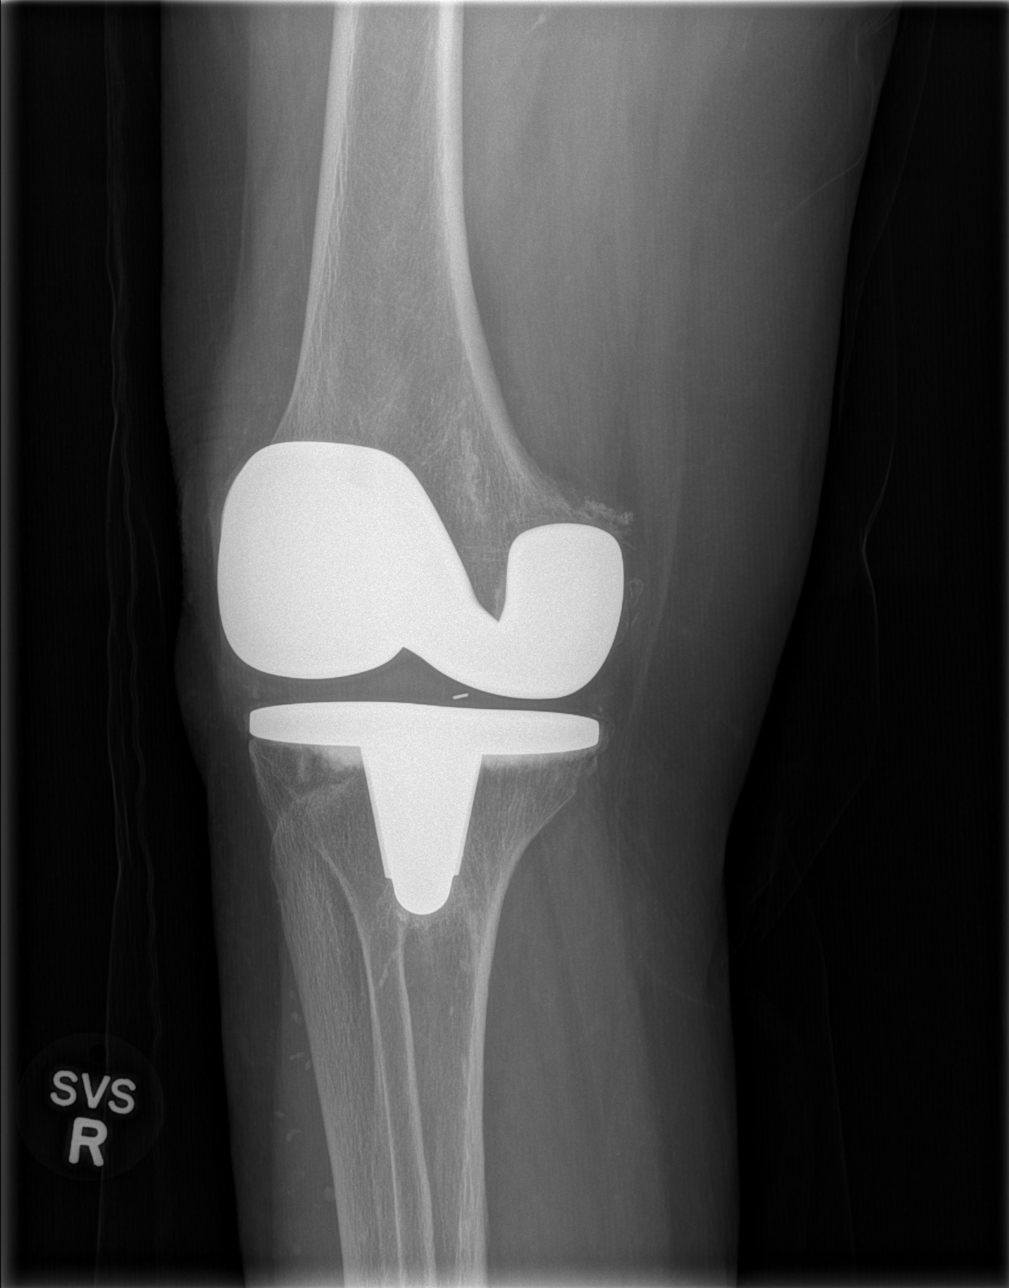

[t knee obl right (2 of 2)]
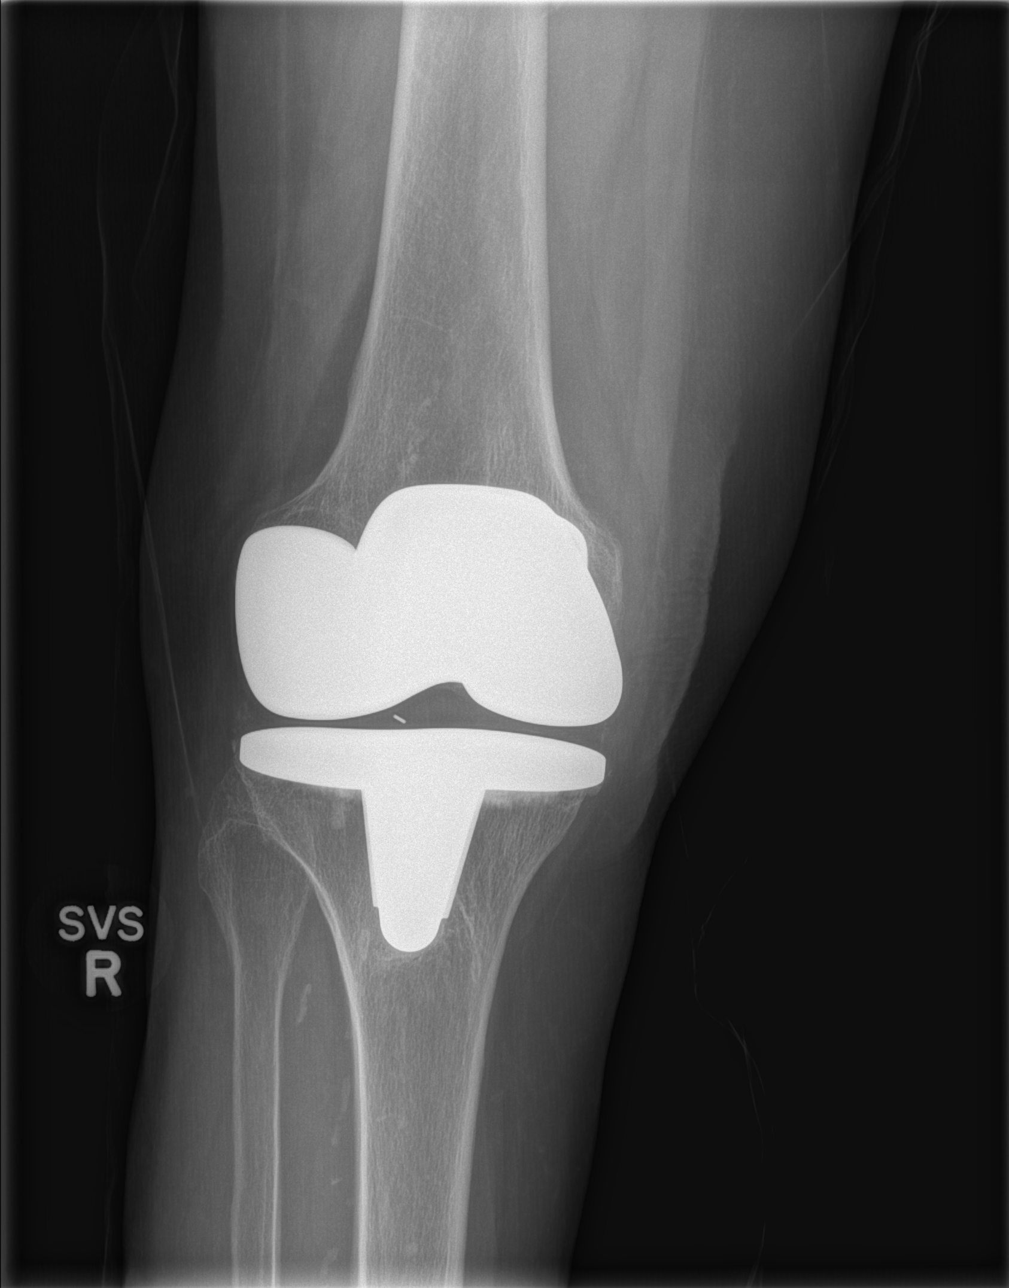

[x knee lat right]
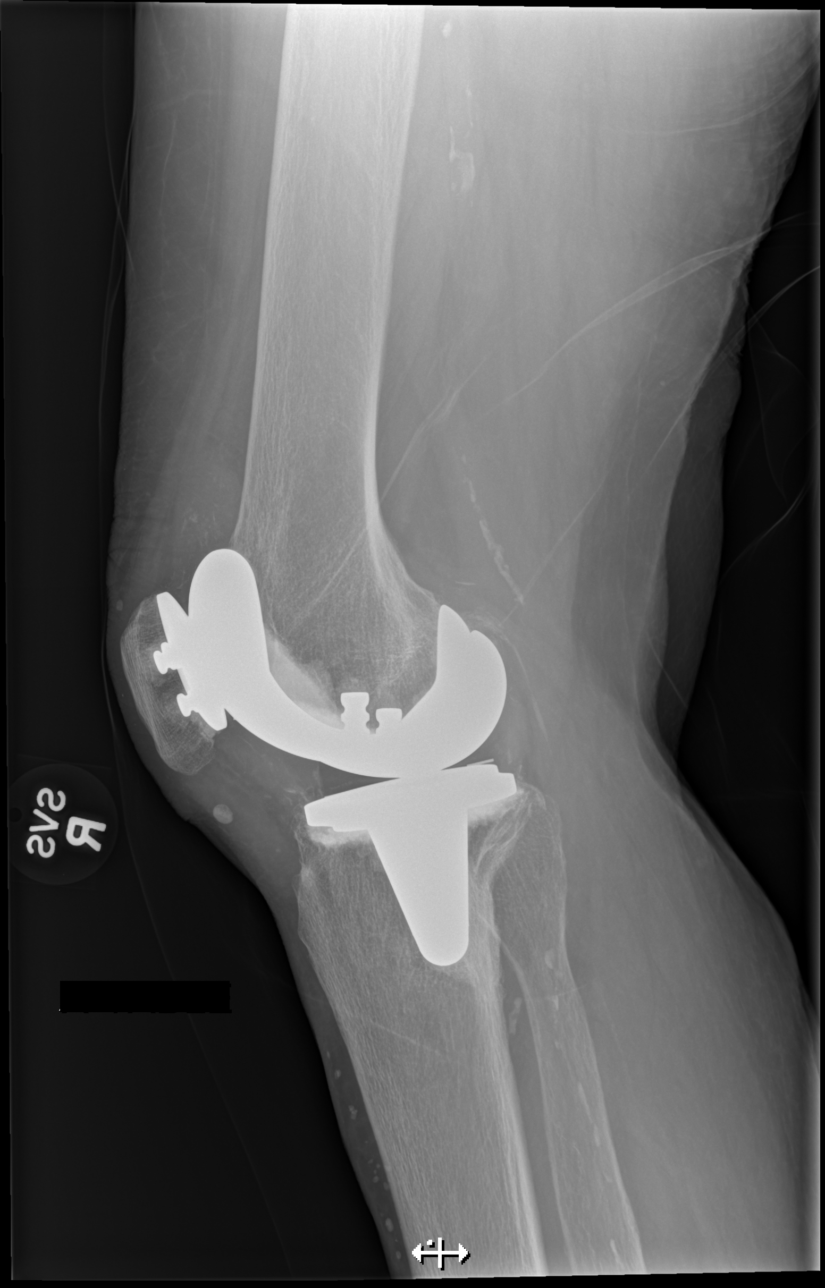

[4 of 4 positions shown; findings below may reference images not displayed]

FINDINGS: A right knee arthroplasty shows anatomic alignment.
There is no evidence of fracture or dislocation.  No evidence of
significant joint effusion.
IMPRESSION: No acute fracture.  Normal alignment of right knee arthroplasty.

## 2012-02-03 DIAGNOSIS — E559 Vitamin D deficiency, unspecified: Secondary | ICD-10-CM | POA: Insufficient documentation

## 2012-02-03 HISTORY — DX: Vitamin D deficiency, unspecified: E55.9

## 2012-06-19 ENCOUNTER — Other Ambulatory Visit: Payer: Self-pay | Admitting: Internal Medicine

## 2012-06-19 DIAGNOSIS — Z1231 Encounter for screening mammogram for malignant neoplasm of breast: Secondary | ICD-10-CM

## 2012-07-23 ENCOUNTER — Ambulatory Visit
Admission: RE | Admit: 2012-07-23 | Discharge: 2012-07-23 | Disposition: A | Discharge status: Home / Self Care | Payer: Medicare Other | Source: Ambulatory Visit | Attending: Internal Medicine | Admitting: Internal Medicine

## 2012-07-23 DIAGNOSIS — Z1231 Encounter for screening mammogram for malignant neoplasm of breast: Secondary | ICD-10-CM

## 2013-02-22 ENCOUNTER — Non-Acute Institutional Stay: Payer: Medicare PPO | Admitting: Internal Medicine

## 2013-02-22 VITALS — BP 104/62 | HR 60 | Ht 60.5 in | Wt 146.0 lb

## 2013-02-22 DIAGNOSIS — E559 Vitamin D deficiency, unspecified: Secondary | ICD-10-CM

## 2013-02-22 DIAGNOSIS — E785 Hyperlipidemia, unspecified: Secondary | ICD-10-CM

## 2013-02-22 DIAGNOSIS — I10 Essential (primary) hypertension: Secondary | ICD-10-CM

## 2013-02-22 DIAGNOSIS — E039 Hypothyroidism, unspecified: Secondary | ICD-10-CM

## 2013-02-22 DIAGNOSIS — H9190 Unspecified hearing loss, unspecified ear: Secondary | ICD-10-CM

## 2013-02-22 DIAGNOSIS — M810 Age-related osteoporosis without current pathological fracture: Secondary | ICD-10-CM

## 2013-02-22 DIAGNOSIS — M542 Cervicalgia: Secondary | ICD-10-CM

## 2013-02-22 NOTE — Progress Notes (Signed)
  Subjective:    Patient ID: [name], female    DOB: [birth year], 79 y.o.   MRN: [id number]  HPI In general, the patient is feeling well.  Previous problems with nummular eczema seemed to be improving. She says the kitchen he is no longer cooking with wine and this is helping her. Previously she thought the wine sulfites could be acting on her skin adversely.  Patient stopped taking her levothyroxine for no apparent reason. Upon return of the lab tests last week, she was contacted about this. She says that she has resumed taking it will not stop it again.  Previous problems with her neck and improved.  She has senile osteoporosis will be due at Prolia injection 04/13/2013.  Labs as previously showed low vitamin D level. She is not supplementing reliably with extra vitamin D.   Review of Systems  Constitutional: Negative.   HENT: Negative.   Eyes: Negative.   Respiratory: Negative.   Cardiovascular: Negative.   Gastrointestinal: Negative.   Endocrine: Negative.   Genitourinary: Negative.   Musculoskeletal: Negative.   Skin:       Diffuse small patches on skin previously diagnosed as nummular eczema by dermatologist.  Allergic/Immunologic: Negative.   Neurological:       Memory loss. Anxiety.  Hematological: Negative.   Psychiatric/Behavioral: Negative.        Objective:BP 104/62  Pulse 60  Ht 5' 0.5" (1.537 m)  Wt 146 lb (66.225 kg)  BMI 28.03 kg/m2    Physical Exam   General Appearance:    Alert, cooperative, no distress, appears stated age. Frail.   Head:    Normocephalic, without obvious abnormality, atraumatic  Eyes:    PERRL, conjunctiva/corneas clear, EOM's intact, fundi    benign, both eyes  Ears:    Normal TM's and external ear canals, both ears. Mildly diminished hearing bilaterally   Nose:   Nares normal, septum midline, mucosa normal, no drainage    or sinus tenderness  Throat:   Lips, mucosa, and tongue normal; teeth and gums normal  Neck:    Supple, symmetrical, trachea midline, no adenopathy;    thyroid:  no enlargement/tenderness/nodules; no carotid   bruit or JVD  Back:     Symmetric, no curvature, ROM normal, no CVA tenderness  Lungs:     Clear to auscultation bilaterally, respirations unlabored  Chest Wall:    No tenderness or deformity   Heart:    Regular rate and rhythm, S1 and S2 normal, no murmur, rub   or gallop  Abdomen:     Soft, non-tender, bowel sounds active all four quadrants,    no masses, no organomegaly  Extremities:   Extremities normal, atraumatic, no cyanosis or edema  Pulses:   2+ and symmetric all extremities  Skin:   patient has patches on all 4 extremities as well as the trunk. There is no bleeding.  Lymph nodes:   Cervical, supraclavicular, and axillary nodes normal  Neurologic:   CNII-XII intact, normal strength, sensation and reflexes    throughout          Assessment & Plan:  Unspecified hypothyroidism  Patient strongly advised to stay on her levothyroxin. She gets hypothyroid shortly after stopping Unspecified vitamin D deficiency  Recheck in future visit Other and unspecified hyperlipidemia  Recheck in future visit Unspecified hearing loss  Chronic. Unchanged Unspecified essential hypertension  Continue current medications Senile osteoporosis  Scheduled for early 04/13/13 Cervicalgia  Improved. Asymptomatic.

## 2013-03-12 ENCOUNTER — Encounter: Payer: Self-pay | Admitting: Internal Medicine

## 2013-04-12 ENCOUNTER — Ambulatory Visit (INDEPENDENT_AMBULATORY_CARE_PROVIDER_SITE_OTHER): Payer: Medicare Other | Admitting: Internal Medicine

## 2013-04-12 DIAGNOSIS — M810 Age-related osteoporosis without current pathological fracture: Secondary | ICD-10-CM

## 2013-04-12 MED ORDER — DENOSUMAB 60 MG/ML ~~LOC~~ SOLN
60.0000 mg | Freq: Once | SUBCUTANEOUS | Status: AC
  Administered 2013-04-12: 60 mg via SUBCUTANEOUS

## 2013-04-13 ENCOUNTER — Encounter: Payer: Self-pay | Admitting: Internal Medicine

## 2013-04-13 NOTE — Progress Notes (Signed)
Patient ID: [name], female   DOB: Sep 30, 1927, 79 y.o.   MRN: [id number] Pt has h/o senile osteoporosis.  She received her q 6 monthly prolia injection 04/12/13.

## 2013-04-13 NOTE — Assessment & Plan Note (Signed)
Given prolia injection 04/12/13.  Next due October 12, 2013.

## 2013-04-26 ENCOUNTER — Non-Acute Institutional Stay: Payer: Medicare Other | Admitting: Internal Medicine

## 2013-04-26 ENCOUNTER — Encounter: Payer: Self-pay | Admitting: Internal Medicine

## 2013-04-26 VITALS — BP 126/70 | HR 60 | Temp 96.0°F | Ht 60.5 in | Wt 139.0 lb

## 2013-04-26 DIAGNOSIS — I10 Essential (primary) hypertension: Secondary | ICD-10-CM

## 2013-04-26 DIAGNOSIS — E039 Hypothyroidism, unspecified: Secondary | ICD-10-CM

## 2013-04-26 DIAGNOSIS — R5383 Other fatigue: Secondary | ICD-10-CM

## 2013-04-26 DIAGNOSIS — R5381 Other malaise: Secondary | ICD-10-CM

## 2013-04-26 DIAGNOSIS — R413 Other amnesia: Secondary | ICD-10-CM

## 2013-04-26 NOTE — Patient Instructions (Signed)
Continue current medications. 

## 2013-04-26 NOTE — Progress Notes (Signed)
Subjective:    Patient ID: [name], female    DOB: 1927-01-10, 79 y.o.   MRN: [id number]  HPI Not feeling well. No fever, but she feels hot. Has a pain in the front of her head. Sweaty at times. Feels like she needs to go to bed and stay there. Repeated states she does not feel well.  Memory loss: progressing per her daughter. Repetitious. Nervous a lot.  Unspecified essential hypertension: stable  Unspecified hypothyroidism: needs reck of lab  Other malaise and fatigue: as above  Current Outpatient Prescriptions on File Prior to Visit  Medication Sig Dispense Refill  . Biotin 1000 MCG tablet Take 1,000 mcg by mouth. Take one twice daily to help skin, hair and nails      . Calcium-Vitamin D-Vitamin K (VIACTIV) 500-500-40 MG-UNT-MCG CHEW Chew 1 each by mouth. Chew and swallow one twice daily for calcium supplement      . Cholecalciferol (VITAMIN D) 2000 UNITS tablet Take 2,000 Units by mouth daily.      Marland Kitchen denosumab (PROLIA) 60 MG/ML SOLN injection Inject 60 mg into the skin every 6 (six) months. Administer in upper arm, thigh, or abdomen      . Ginkgo Biloba 40 MG TABS Take by mouth. One twice daily to help memory      . levothyroxine (SYNTHROID, LEVOTHROID) 75 MCG tablet Take 75 mcg by mouth daily before breakfast. For thyroid supplement      . Specialty Vitamins Products (ICAPS LUTEIN-ZEAXANTHIN PO) Take by mouth. Take one twice daily for eyes            Review of Systems  Constitutional: Positive for diaphoresis and fatigue. Negative for activity change and appetite change.  HENT: Negative.   Eyes: Negative.   Respiratory: Negative.   Cardiovascular: Negative.   Gastrointestinal: Negative.   Endocrine: Negative.   Genitourinary: Negative.   Musculoskeletal: Negative.   Skin:       Diffuse small patches on skin previously diagnosed as nummular eczema by dermatologist.  Allergic/Immunologic: Negative.   Neurological:       Memory loss. Anxiety.  Hematological:  Negative.   Psychiatric/Behavioral: Negative.        Objective:BP 126/70  Pulse 60  Temp(Src) 96 F (35.6 C) (Oral)  Ht 5' 0.5" (1.537 m)  Wt 139 lb (63.05 kg)  BMI 26.69 kg/m2    Physical Exam General Appearance:     Alert, cooperative, no distress, appears stated age. Frail.    Head:     Normocephalic, without obvious abnormality, atraumatic   Eyes:     PERRL, conjunctiva/corneas clear, EOM's intact, fundi     benign, both eyes . Corrective lenses  Ears:     Normal TM's and external ear canals, both ears. Mildly diminished hearing bilaterally    Nose:    Nares normal, septum midline, mucosa normal, no drainage     or sinus tenderness   Throat:    Lips, mucosa, and tongue normal; teeth and gums normal   Neck:    Supple, symmetrical, trachea midline, no adenopathy;     thyroid:  no enlargement/tenderness/nodules; no carotid   bruit or JVD   Back:      Symmetric, no curvature, ROM normal, no CVA tenderness   Lungs:      Clear to auscultation bilaterally, respirations unlabored   Chest Wall:     No tenderness or deformity    Heart:     Regular rate and rhythm, S1 and S2 normal, no murmur,  rub   or gallop   Abdomen:      Soft, non-tender, bowel sounds active all four quadrants,     no masses, no organomegaly   Extremities:    Extremities normal, atraumatic, no cyanosis or edema   Pulses:    2+ and symmetric all extremities   Skin:    patient has patches on all 4 extremities as well as the trunk. There is no bleeding.   Lymph nodes:    Cervical, supraclavicular, and axillary nodes normal   Neurologic:    CNII-XII intact, normal strength, sensation and reflexes     throughout       Assessment & Plan:  Memory loss:: worsening  Unspecified essential hypertension: stable  Unspecified hypothyroidism: recheck lab  Other malaise and fatigue: check lab

## 2013-05-03 ENCOUNTER — Encounter: Payer: Self-pay | Admitting: Internal Medicine

## 2013-05-03 ENCOUNTER — Non-Acute Institutional Stay: Payer: Medicare PPO | Admitting: Internal Medicine

## 2013-05-03 VITALS — BP 116/64 | HR 56 | Ht 60.5 in | Wt 138.0 lb

## 2013-05-03 DIAGNOSIS — I10 Essential (primary) hypertension: Secondary | ICD-10-CM

## 2013-05-03 DIAGNOSIS — R413 Other amnesia: Secondary | ICD-10-CM

## 2013-05-03 DIAGNOSIS — R5381 Other malaise: Secondary | ICD-10-CM

## 2013-05-03 DIAGNOSIS — E039 Hypothyroidism, unspecified: Secondary | ICD-10-CM

## 2013-05-03 DIAGNOSIS — R5383 Other fatigue: Secondary | ICD-10-CM

## 2013-05-03 NOTE — Progress Notes (Signed)
Subjective:    Patient ID: [name], female    DOB: 1927-01-09, 79 y.o.   MRN: [id number]  HPI Feels like her forehead is hot like a fever. Does not have thermometer. Has complained to family of sweaty feelings, but today she says No sweats, but she feels hot. Sometimes she has a headache in the forehead. Complains of headache a lot to her daughter. No nausea, change in stools, near syncope. Appetite is OK.  Feeling tired. Walking in the hall is a pain.  Denies sad feelings or nervous feelings. D[place] is leaving for [place] in a few weeks. Daughter is concerned that some of the current leg symptoms are related to anxiety over her future departure. The patient immediately denies that this is an issue.  Able to sleep, but she wakes early. No change in usual sleep pattern.  Current Outpatient Prescriptions on File Prior to Visit  Medication Sig Dispense Refill  . Biotin 1000 MCG tablet Take 1,000 mcg by mouth. Take one twice daily to help skin, hair and nails      . Calcium-Vitamin D-Vitamin K (VIACTIV) 500-500-40 MG-UNT-MCG CHEW Chew 1 each by mouth. Chew and swallow one twice daily for calcium supplement      . Cholecalciferol (VITAMIN D) 2000 UNITS tablet Take 2,000 Units by mouth daily.      Marland Kitchen denosumab (PROLIA) 60 MG/ML SOLN injection Inject 60 mg into the skin every 6 (six) months. Administer in upper arm, thigh, or abdomen      . Ginkgo Biloba 40 MG TABS Take by mouth. One twice daily to help memory      . levothyroxine (SYNTHROID, LEVOTHROID) 75 MCG tablet Take 75 mcg by mouth daily before breakfast. For thyroid supplement      . Specialty Vitamins Products (ICAPS LUTEIN-ZEAXANTHIN PO) Take by mouth. Take one twice daily for eyes       No current facility-administered medications on file prior to visit.    Review of Systems  Constitutional: Positive for diaphoresis and fatigue. Negative for activity change and appetite change.  HENT: Negative.   Eyes: Negative.    Respiratory: Negative.   Cardiovascular: Negative.   Gastrointestinal: Negative.   Endocrine: Negative.   Genitourinary: Negative.   Musculoskeletal: Negative.   Skin:       Diffuse small patches on skin previously diagnosed as nummular eczema by dermatologist.  Allergic/Immunologic: Negative.   Neurological:       Memory loss. Anxiety.  Hematological: Negative.   Psychiatric/Behavioral: Negative.        Objective:BP 116/64  Pulse 56  Ht 5' 0.5" (1.537 m)  Wt 138 lb (62.596 kg)  BMI 26.5 kg/m2    Physical Exam  Constitutional: She is oriented to person, place, and time. She appears well-nourished. She appears distressed.  HENT:  Head: Atraumatic.  Right Ear: External ear normal.  Left Ear: External ear normal.  Partial hearing loss.  Eyes:  Wears corrective lenses.  Neck: No JVD present. No tracheal deviation present. No thyromegaly present.  Cardiovascular: Normal rate, regular rhythm and intact distal pulses.  Exam reveals no gallop and no friction rub.   Pulmonary/Chest: No respiratory distress. She has no wheezes. She has no rales. She exhibits no tenderness.  Abdominal: Soft. Bowel sounds are normal. She exhibits no distension and no mass. There is no tenderness.  Musculoskeletal: Normal range of motion. She exhibits no edema and no tenderness.  Lymphadenopathy:    She has no cervical adenopathy.  Neurological: She is oriented  to person, place, and time. She has normal reflexes. No cranial nerve deficit. Coordination normal.  Skin: Skin is dry. No erythema. No pallor.  Diffuse scarring from previous rash. Mostly on the arms.  Psychiatric: She has a normal mood and affect. Her behavior is normal. Judgment and thought content normal.    LABS REVIEWED 04/29/2013 CBC: Normal  CMP normal  TSH 1.084  Urinalysis normal; culture no growth      Assessment & Plan:  Unspecified hypothyroidism: Recent TSH normal  Unspecified essential hypertension:  Controlled  Memory loss: Mild progression  Other malaise and fatigue: This is the focus of the patient's current weight. Etiology is not established. It does not seem to be any identifiable organic cause for her malaise. She is not willing to take any additional medications for her nerves at this time.

## 2013-05-09 NOTE — Patient Instructions (Signed)
Continue current medications. Let us know if there is any change in her symptoms.

## 2013-05-11 ENCOUNTER — Telehealth: Payer: Self-pay

## 2013-05-19 ENCOUNTER — Non-Acute Institutional Stay: Payer: Medicare Other | Admitting: Geriatric Medicine

## 2013-05-19 ENCOUNTER — Encounter: Payer: Self-pay | Admitting: Geriatric Medicine

## 2013-05-19 VITALS — BP 100/62 | HR 62 | Temp 96.2°F | Ht 60.5 in | Wt 136.6 lb

## 2013-05-19 DIAGNOSIS — K5900 Constipation, unspecified: Secondary | ICD-10-CM

## 2013-05-19 DIAGNOSIS — R413 Other amnesia: Secondary | ICD-10-CM

## 2013-05-19 NOTE — Assessment & Plan Note (Signed)
Patient was quite anxious and confused yesterday when she had GI issues. Brief evaluation today reveals MN SB declined significantly over the last 2 years in 2011 she scored 25/30 today scored 16/30. Clock drawing was with an accurate representation of 11:10. patient admits to some mild memory loss but denies any problems with functional status. She is a bit defensive about how well she takes care of herself. Recommended further evaluation of cognition and function by ST, patient declined. She will consider discussing this further with Dr. Chilton Si at her next visit.

## 2013-05-19 NOTE — Progress Notes (Signed)
Patient ID: [name], female   DOB: 1927-08-28, 79 y.o.   MRN: [id number] [facility] [phone])  Code Status: Living Will, [facility]  Contact Information   Name Relation Home Work Mobile   Nestor Ramp  [phone]        Chief Complaint  Patient presents with  . Constipation    cleared out last night. Was constipated for one day-yesterday.. Was up sveral times last night to have bowel movements    HPI: This is a 79 y.o. female resident of [facility],  Independent Living  section.  Evaluation is requested today due to nurse's report of increased confusion, STML, constipation.  Clinic nurse so this patient last week with complaints of constipation. A Dulcolax suppository was recommended, patient declined. She returned to rehabilitation section yesterday complaining of constipation and requesting a suppository. She was assisted over to the clinic area. Apparently patient had not had a bowel movement in 2 days, nurse's exam did not reveal anything significant. She did administer the suppository. Pt. called several hours later complaining that she felt poorly and needed a suppository; she did not recall her visit to the clinic earlier that day. Nurse explained the visit, etc.  Patient reports that she had multiple bowel movements overnight, feels very today. Recalls that she was very uncomfortable yesterday, maintains that she only missed a bowel movement one day, doesn't understand why she was so uncomfortable. Admits to some difficulties with her memory but feels she was confused yesterday because she felt so poorly.   Allergies  Allergen Reactions  . Codeine   . Darvocet (Propoxyphene-Acetaminophen)   . Penicillins   . Percocet (Oxycodone-Acetaminophen)   . Sulfa Antibiotics   . Sulfites   . Tobramycin-Dexamethasone    Medications    Medication List       This list is accurate as of: 05/19/13  1:05 PM.  Always use your most recent  med list.               denosumab 60 MG/ML Soln injection  Commonly known as:  PROLIA  Inject 60 mg into the skin every 6 (six) months. Administer in upper arm, thigh, or abdomen     ICAPS LUTEIN-ZEAXANTHIN PO  Take by mouth. Take one twice daily for eyes     levothyroxine 75 MCG tablet  Commonly known as:  SYNTHROID, LEVOTHROID  Take 75 mcg by mouth daily before breakfast. For thyroid supplement        DATA REVIEWED  Radiologic Exams:   Cardiovascular Exams:   Laboratory Studies:  [facility] lab  02/16/2013 TSH 15.24  04/29/2013 WBC 5.6, hemoglobin 13.9, hematocrit 41.1, platelet 290  Glucose 90, BUN 16, creatinine 0.76, sodium 140, potassium 4.3.  Protein/LFTs WNL.  TSH 1.08  Urinalysis WNL      Review of Systems  DATA OBTAINED: from patient, GENERAL: Feels well   No fevers, fatigue, change in appetite or weight SKIN: No itch, rash or open wounds EYES: No eye pain, dryness or itching  No change in vision EARS: No earache, tinnitus, change in hearing NOSE: No congestion, drainage or bleeding MOUTH/THROAT: No mouth or tooth pain  No sore throat No difficulty chewing or swallowing RESPIRATORY: No cough, wheezing, SOB CARDIAC: No chest pain, palpitations  No edema. GI: No abdominal pain  No N/V/D  No heartburn or reflux SEE HPI GU: No dysuria, frequency or urgency  No change in urine volume or character    MUSCULOSKELETAL: No joint pain, swelling or  stiffness  No back pain  No muscle ache, pain, weakness  Gait is steady  No recent falls.  NEUROLOGIC: No dizziness, fainting, headache  No change in mental status.   PSYCHIATRIC: No feelings of anxiety, depression Sleeps well.    Physical Exam Filed Vitals:   05/19/13 1054  BP: 100/62  Pulse: 62  Temp: 96.2 F (35.7 C)  TempSrc: Oral  Height: 5' 0.5" (1.537 m)  Weight: 136 lb 9.6 oz (61.961 kg)   Body mass index is 26.23 kg/(m^2).  GENERAL APPEARANCE: No acute distress, appropriately groomed, normal body  habitus. Alert, pleasant, conversant. SKIN: No diaphoresis, rash, unusual lesions, wounds HEAD: Normocephalic, atraumatic EYES: Conjunctiva/lids clear. Pupils round, reactive.  EARS: Decreased Hearing  NOSE: No deformity or discharge. MOUTH/THROAT: Lips w/o lesions. Oral mucosa, tongue moist, w/o lesion. Oropharynx w/o redness or lesions.  NECK: Supple, full ROM. No thyroid tenderness, enlargement or nodule LYMPHATICS: No head, neck or supraclavicular adenopathy RESPIRATORY: Breathing is even, unlabored. Lung sounds are clear and full.  CARDIOVASCULAR: Heart RRR. No murmur or extra heart sounds  EDEMA: No peripheral or periorbital edema. No ascites GASTROINTESTINAL: Abdomen is soft, non-tender, not distended w/ normal bowel sounds. No hepatic or splenic enlargement.  MUSCULOSKELETAL: Moves all extremities with full ROM, strength and tone. Back is without kyphosis, scoliosis or spinal process tenderness. Gait is steady NEUROLOGIC: Oriented to time, place, person. Speech clear, no tremor.   MMSE 16/30, Failed Clock (misrepresentation of 11:10) PSYCHIATRIC: Mood and affect appropriate to situation, some defensiveness when talking about memory/ functional status  ASSESSMENT/PLAN  Unspecified constipation Very uncomfortable yesterday with constipation, this was relieved with a suppository. Patient advised to continue eating fresh fruits and vegetables adequate fluid intake. She has further problems may require daily laxative.  Memory deficit Patient was quite anxious and confused yesterday when she had GI issues. Brief evaluation today reveals MN SB declined significantly over the last 2 years in 2011 she scored 25/30 today scored 16/30. Clock drawing was with an accurate representation of 11:10. patient admits to some mild memory loss but denies any problems with functional status. She is a bit defensive about how well she takes care of herself. Recommended further evaluation of cognition and  function by ST, patient declined. She will consider discussing this further with Dr. Chilton Si at her next visit.   Time: 40 minutes spent with patient, > 50 % spent counseling/ care coordination  Follow up: AS scheduled w/ Dr.Green  Kayleb Warshaw T.Viktoria Gruetzmacher, NP-C 05/19/2013

## 2013-05-19 NOTE — Assessment & Plan Note (Signed)
Very uncomfortable yesterday with constipation, this was relieved with a suppository. Patient advised to continue eating fresh fruits and vegetables adequate fluid intake. She has further problems may require daily laxative.

## 2013-05-31 ENCOUNTER — Encounter: Payer: Self-pay | Admitting: Internal Medicine

## 2013-05-31 ENCOUNTER — Non-Acute Institutional Stay: Payer: Medicare Other | Admitting: Internal Medicine

## 2013-05-31 VITALS — BP 100/58 | HR 60 | Ht 60.5 in | Wt 133.0 lb

## 2013-05-31 DIAGNOSIS — E559 Vitamin D deficiency, unspecified: Secondary | ICD-10-CM

## 2013-05-31 DIAGNOSIS — R413 Other amnesia: Secondary | ICD-10-CM

## 2013-05-31 DIAGNOSIS — I10 Essential (primary) hypertension: Secondary | ICD-10-CM

## 2013-05-31 DIAGNOSIS — E039 Hypothyroidism, unspecified: Secondary | ICD-10-CM

## 2013-05-31 NOTE — Progress Notes (Signed)
  Subjective:    Patient ID: [name], female    DOB: [birth year], 79 y.o.   MRN: [id number]  HPI Having trouble with her neck. Broke out in a rash a month ago after she drank some horrible tasting coffee. She is not using anything for it at present.  Unspecified essential hypertension:controlled  Unspecified hypothyroidism: controlled on supplement  Unspecified vitamin D deficiency: corrected with supplements  Memory deficit: unchanged  Constipation has improved.   Current Outpatient Prescriptions on File Prior to Visit  Medication Sig Dispense Refill  . denosumab (PROLIA) 60 MG/ML SOLN injection Inject 60 mg into the skin every 6 (six) months. Administer in upper arm, thigh, or abdomen      . levothyroxine (SYNTHROID, LEVOTHROID) 75 MCG tablet Take 75 mcg by mouth daily before breakfast. For thyroid supplement      . Specialty Vitamins Products (ICAPS LUTEIN-ZEAXANTHIN PO) Take by mouth. Take one twice daily for eyes       No current facility-administered medications on file prior to visit.    Review of Systems  Constitutional: Positive for diaphoresis and fatigue. Negative for activity change and appetite change.  HENT: Negative.   Eyes: Negative.   Respiratory: Negative.   Cardiovascular: Negative.   Gastrointestinal: Negative.   Endocrine: Negative.   Genitourinary: Negative.   Musculoskeletal: Negative.   Skin:       Diffuse small patches on skin previously diagnosed as nummular eczema by dermatologist. New rash at the neck. She denies itching.  Allergic/Immunologic: Negative.   Neurological:       Memory loss. Anxiety.  Hematological: Negative.   Psychiatric/Behavioral: Negative.        Objective:BP 100/58  Pulse 60  Ht 5' 0.5" (1.537 m)  Wt 133 lb (60.328 kg)  BMI 25.54 kg/m2    Physical Exam  Constitutional: She is oriented to person, place, and time. She appears well-nourished. She appears distressed.  HENT:  Head: Atraumatic.  Right Ear:  External ear normal.  Left Ear: External ear normal.  Partial hearing loss.  Eyes:  Wears corrective lenses.  Neck: No JVD present. No tracheal deviation present. No thyromegaly present.  Cardiovascular: Normal rate, regular rhythm and intact distal pulses.  Exam reveals no gallop and no friction rub.   Pulmonary/Chest: No respiratory distress. She has no wheezes. She has no rales. She exhibits no tenderness.  Abdominal: Soft. Bowel sounds are normal. She exhibits no distension and no mass. There is no tenderness.  Musculoskeletal: Normal range of motion. She exhibits no edema and no tenderness.  Lymphadenopathy:    She has no cervical adenopathy.  Neurological: She is oriented to person, place, and time. She has normal reflexes. No cranial nerve deficit. Coordination normal.  Skin: Skin is dry. No erythema. No pallor.  Diffuse scarring from previous rash. Mostly on the arms. New erythematous rash at the neck.  Psychiatric: She has a normal mood and affect. Her behavior is normal. Judgment and thought content normal.     05/25/13 TSH 0.712  Vit D 44     Assessment & Plan:  Unspecified essential hypertension: Controlled and actually on the low side today  Unspecified hypothyroidism: Controlled with supplements. Last TSH normal  Unspecified vitamin D deficiency: Last vitamin D normal. Continue supplements.  Memory deficit: Getting worse.

## 2013-06-04 ENCOUNTER — Encounter: Payer: Self-pay | Admitting: Geriatric Medicine

## 2013-06-04 ENCOUNTER — Non-Acute Institutional Stay: Payer: Medicare Other | Admitting: Geriatric Medicine

## 2013-06-04 DIAGNOSIS — F22 Delusional disorders: Secondary | ICD-10-CM

## 2013-06-04 HISTORY — DX: Delusional disorders: F22
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
## 2013-06-07 NOTE — Patient Instructions (Signed)
Continue current medications. 

## 2013-06-16 ENCOUNTER — Non-Acute Institutional Stay: Payer: Medicare Other | Admitting: Geriatric Medicine

## 2013-06-16 ENCOUNTER — Encounter: Payer: Self-pay | Admitting: Geriatric Medicine

## 2013-06-16 VITALS — BP 144/72 | HR 64 | Wt 135.0 lb

## 2013-06-16 DIAGNOSIS — F22 Delusional disorders: Secondary | ICD-10-CM

## 2013-06-16 DIAGNOSIS — R413 Other amnesia: Secondary | ICD-10-CM

## 2013-06-16 NOTE — Progress Notes (Signed)
Failed Clock Drawing test----given by Melina Modena, CMA
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
## 2013-06-17 NOTE — Assessment & Plan Note (Signed)
MMSE shows deficits in short term memory along with attention/ calculation, Clock test shows some deficit in executive function. Patient is minimally aware of deficits "this is just because I am getting older". These cognitive deficits along with paranoia/delusion are suggestive of Lewy Body type dementia; patient may benefit from acetylcholinesterase inhibitor. However as she is unaware of the extent of her deficits, she is unwilling to take any more medication at this time.
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
## 2013-06-22 ENCOUNTER — Encounter: Payer: Self-pay | Admitting: Internal Medicine

## 2013-07-05 ENCOUNTER — Non-Acute Institutional Stay: Payer: Medicare Other | Admitting: Internal Medicine

## 2013-07-05 ENCOUNTER — Encounter: Payer: Self-pay | Admitting: Internal Medicine

## 2013-07-05 VITALS — BP 104/58 | HR 52 | Ht 60.0 in | Wt 139.0 lb

## 2013-07-05 DIAGNOSIS — F22 Delusional disorders: Secondary | ICD-10-CM

## 2013-07-05 DIAGNOSIS — E039 Hypothyroidism, unspecified: Secondary | ICD-10-CM

## 2013-07-05 DIAGNOSIS — R413 Other amnesia: Secondary | ICD-10-CM

## 2013-07-05 DIAGNOSIS — I10 Essential (primary) hypertension: Secondary | ICD-10-CM

## 2013-07-05 NOTE — Patient Instructions (Addendum)
Continue current medications. Plan to stay in Assisted Living.
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
## 2013-07-27 ENCOUNTER — Encounter: Payer: Self-pay | Admitting: Neurology

## 2013-07-27 ENCOUNTER — Ambulatory Visit (INDEPENDENT_AMBULATORY_CARE_PROVIDER_SITE_OTHER): Payer: Medicare Other | Admitting: Neurology

## 2013-07-27 ENCOUNTER — Encounter (INDEPENDENT_AMBULATORY_CARE_PROVIDER_SITE_OTHER): Payer: Self-pay

## 2013-07-27 VITALS — BP 129/77 | HR 51 | Temp 98.0°F | Ht 60.5 in | Wt 141.0 lb

## 2013-07-27 DIAGNOSIS — F22 Delusional disorders: Secondary | ICD-10-CM

## 2013-07-27 DIAGNOSIS — M810 Age-related osteoporosis without current pathological fracture: Secondary | ICD-10-CM

## 2013-07-27 DIAGNOSIS — F0391 Unspecified dementia with behavioral disturbance: Secondary | ICD-10-CM

## 2013-07-27 MED ORDER — DONEPEZIL HCL 5 MG PO TABS: 5.0000 mg | ORAL_TABLET | Freq: Every day | ORAL | Status: DC

## 2013-07-27 MED ORDER — DONEPEZIL HCL 5 MG PO TBDP: 5.0000 mg | ORAL_TABLET | Freq: Every day | ORAL | Status: DC

## 2013-07-27 NOTE — Patient Instructions (Signed)
I think overall you are doing fairly well but I do want to suggest a few things today:  Remember to drink plenty of fluid, eat healthy meals and do not skip any meals. Try to eat protein with a every meal and eat a healthy snack such as fruit or nuts in between meals. Try to keep a regular sleep-wake schedule and try to exercise daily, particularly in the form of walking, 20-30 minutes a day, if you can.   Engage in social activities in your community and with your family and try to keep up with current events by reading the newspaper or watching the news.   As far as your medications are concerned, I would like to suggest a trial of aricept, once we have test results back.  As far as diagnostic testing: MRI brain and blood work  I would like to see you back in 3 months, sooner if we need to. Please call us with any interim questions, concerns, problems, updates or refill requests.  Please also call us for any test results so we can go over those with you on the phone. Brett Canales is my clinical assistant and will answer any of your questions and relay your messages to me and also relay most of my messages to you.  Our phone number is [phone]. We also have an after hours call service for urgent matters and there is a physician on-call for urgent questions. For any emergencies you know to call 911 or go to the nearest emergency room.
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
## 2013-07-28 LAB — HGB A1C W/O EAG: Hgb A1c MFr Bld: 6 % — ABNORMAL HIGH (ref 4.8–5.6)

## 2013-07-28 LAB — SEDIMENTATION RATE: Sed Rate: 10 mm/hr (ref 0–40)

## 2013-07-28 LAB — RPR: RPR: NONREACTIVE

## 2013-07-30 NOTE — Progress Notes (Signed)
Quick Note:  Please advise daughter [name] that her mother's blood work showed negative inflammatory and infectious markers, but her hemoglobin A1c was elevated at 6. This indicates an increased risk for diabetes but typically does not mean there is frank diabetes. Huston Foley, MD, PhD [facility] ([facility]  ______

## 2013-08-02 ENCOUNTER — Encounter: Payer: Self-pay | Admitting: Internal Medicine

## 2013-08-02 ENCOUNTER — Non-Acute Institutional Stay: Payer: Medicare Other | Admitting: Internal Medicine

## 2013-08-02 VITALS — BP 116/68 | HR 60 | Wt 140.0 lb

## 2013-08-02 DIAGNOSIS — F22 Delusional disorders: Secondary | ICD-10-CM

## 2013-08-02 DIAGNOSIS — I10 Essential (primary) hypertension: Secondary | ICD-10-CM

## 2013-08-02 DIAGNOSIS — R413 Other amnesia: Secondary | ICD-10-CM
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
## 2013-08-03 ENCOUNTER — Ambulatory Visit
Admission: RE | Admit: 2013-08-03 | Discharge: 2013-08-03 | Disposition: A | Discharge status: Home / Self Care | Payer: Medicare PPO | Source: Ambulatory Visit | Attending: Neurology | Admitting: Neurology

## 2013-08-03 ENCOUNTER — Encounter: Payer: Self-pay | Admitting: *Deleted

## 2013-08-03 DIAGNOSIS — F0391 Unspecified dementia with behavioral disturbance: Secondary | ICD-10-CM

## 2013-08-03 DIAGNOSIS — M810 Age-related osteoporosis without current pathological fracture: Secondary | ICD-10-CM

## 2013-08-03 DIAGNOSIS — F22 Delusional disorders: Secondary | ICD-10-CM

## 2013-08-03 NOTE — Progress Notes (Signed)
Quick Note:  I called and gave the results of labs to pt and also Olegario Messier, daughter. Mailed copy to her at [facility]. (Dr. Nila Nephew) pcp. ______

## 2013-08-05 NOTE — Progress Notes (Signed)
Quick Note:  Please call patient regarding the recent brain MRI: The brain scan showed a normal structure of the brain and mild volume loss which we call atrophy.  There were changes in the deeper structures of the brain, which we call white matter changes or microvascular changes. There were reported as mild in her case. These are tiny white spots, that occur with time and are seen in a variety of conditions, including with normal aging, chronic hypertension, chronic headaches, especially migraine HAs, chronic diabetes, chronic hyperlipidemia. These are not strokes and no mass or lesion or contrast enhancement was seen which is reassuring. Again, there were no acute findings, such as a stroke, or mass or blood products. No further action is required on this test at this time, other than re-enforcing the importance of good blood pressure control, good cholesterol control, good blood sugar control, and weight management. Please remind patient to keep any upcoming appointments or tests and to call us with any interim questions, concerns, problems or updates. Thanks,  Huston Foley, MD, PhD    ______

## 2013-08-15 NOTE — Patient Instructions (Signed)
Continue current medications. 

## 2013-08-16 ENCOUNTER — Encounter: Payer: Self-pay | Admitting: *Deleted

## 2013-08-17 NOTE — Progress Notes (Signed)
Quick Note:  [name], daughter called back and I relayed the results of MRI to her. S he verbalized understanding. Placed copy in mail. ______

## 2013-09-24 ENCOUNTER — Emergency Department (HOSPITAL_COMMUNITY)
Admission: EM | Admit: 2013-09-24 | Discharge: 2013-09-24 | Disposition: A | Discharge status: Home / Self Care | Payer: 59 | Attending: Emergency Medicine | Admitting: Emergency Medicine

## 2013-09-24 ENCOUNTER — Encounter (HOSPITAL_COMMUNITY): Payer: Self-pay | Admitting: Emergency Medicine

## 2013-09-24 ENCOUNTER — Emergency Department (HOSPITAL_COMMUNITY): Payer: 59

## 2013-09-24 ENCOUNTER — Other Ambulatory Visit: Payer: Self-pay | Admitting: Geriatric Medicine

## 2013-09-24 DIAGNOSIS — R296 Repeated falls: Secondary | ICD-10-CM | POA: Insufficient documentation

## 2013-09-24 DIAGNOSIS — E785 Hyperlipidemia, unspecified: Secondary | ICD-10-CM | POA: Insufficient documentation

## 2013-09-24 DIAGNOSIS — R001 Bradycardia, unspecified: Secondary | ICD-10-CM

## 2013-09-24 DIAGNOSIS — E039 Hypothyroidism, unspecified: Secondary | ICD-10-CM | POA: Insufficient documentation

## 2013-09-24 DIAGNOSIS — F22 Delusional disorders: Secondary | ICD-10-CM | POA: Insufficient documentation

## 2013-09-24 DIAGNOSIS — Y92009 Unspecified place in unspecified non-institutional (private) residence as the place of occurrence of the external cause: Secondary | ICD-10-CM | POA: Insufficient documentation

## 2013-09-24 DIAGNOSIS — S42209A Unspecified fracture of upper end of unspecified humerus, initial encounter for closed fracture: Secondary | ICD-10-CM | POA: Insufficient documentation

## 2013-09-24 DIAGNOSIS — Z87891 Personal history of nicotine dependence: Secondary | ICD-10-CM | POA: Insufficient documentation

## 2013-09-24 DIAGNOSIS — Z880 Allergy status to penicillin: Secondary | ICD-10-CM | POA: Insufficient documentation

## 2013-09-24 DIAGNOSIS — M810 Age-related osteoporosis without current pathological fracture: Secondary | ICD-10-CM | POA: Insufficient documentation

## 2013-09-24 DIAGNOSIS — I10 Essential (primary) hypertension: Secondary | ICD-10-CM | POA: Insufficient documentation

## 2013-09-24 DIAGNOSIS — Z79899 Other long term (current) drug therapy: Secondary | ICD-10-CM | POA: Insufficient documentation

## 2013-09-24 DIAGNOSIS — S42301A Unspecified fracture of shaft of humerus, right arm, initial encounter for closed fracture: Secondary | ICD-10-CM

## 2013-09-24 DIAGNOSIS — Y9389 Activity, other specified: Secondary | ICD-10-CM | POA: Insufficient documentation

## 2013-09-24 DIAGNOSIS — I498 Other specified cardiac arrhythmias: Secondary | ICD-10-CM | POA: Insufficient documentation

## 2013-09-24 LAB — COMPREHENSIVE METABOLIC PANEL
ALT: 20 U/L (ref 0–35)
Alkaline Phosphatase: 81 U/L (ref 39–117)
BUN: 25 mg/dL — ABNORMAL HIGH (ref 6–23)
CO2: 22 mEq/L (ref 19–32)
Calcium: 9.1 mg/dL (ref 8.4–10.5)
GFR calc Af Amer: 60 mL/min — ABNORMAL LOW (ref >90)
GFR calc non Af Amer: 52 mL/min — ABNORMAL LOW (ref >90)
Glucose, Bld: 103 mg/dL — ABNORMAL HIGH (ref 70–99)
Potassium: 4.2 mEq/L (ref 3.5–5.1)
Sodium: 134 mEq/L — ABNORMAL LOW (ref 135–145)

## 2013-09-24 LAB — URINALYSIS, ROUTINE W REFLEX MICROSCOPIC
Bilirubin Urine: NEGATIVE
Hgb urine dipstick: NEGATIVE
Ketones, ur: NEGATIVE mg/dL
Nitrite: NEGATIVE
Urobilinogen, UA: 0.2 mg/dL (ref 0.0–1.0)

## 2013-09-24 LAB — CBC WITH DIFFERENTIAL/PLATELET
Basophils Relative: 1 % (ref 0–1)
Eosinophils Relative: 2 % (ref 0–5)
HCT: 40.4 % (ref 36.0–46.0)
Hemoglobin: 13.6 g/dL (ref 12.0–15.0)
Lymphocytes Relative: 15 % (ref 12–46)
Lymphs Abs: 1.5 10*3/uL (ref 0.7–4.0)
MCH: 29.1 pg (ref 26.0–34.0)
MCV: 86.5 fL (ref 78.0–100.0)
Monocytes Absolute: 0.7 10*3/uL (ref 0.1–1.0)
Monocytes Relative: 8 % (ref 3–12)
Platelets: 215 10*3/uL (ref 150–400)
RBC: 4.67 MIL/uL (ref 3.87–5.11)
RDW: 16.5 % — ABNORMAL HIGH (ref 11.5–15.5)
WBC: 9.9 10*3/uL (ref 4.0–10.5)

## 2013-09-24 LAB — URINE MICROSCOPIC-ADD ON

## 2013-09-24 MED ORDER — HYDROCODONE-ACETAMINOPHEN 5-325 MG PO TABS: 1.0000 | ORAL_TABLET | Freq: Once | ORAL | Status: DC

## 2013-09-24 MED ORDER — TRAMADOL HCL 50 MG PO TABS: 50.0000 mg | ORAL_TABLET | Freq: Four times a day (QID) | ORAL | Status: DC | PRN

## 2013-09-24 MED ORDER — TRAMADOL HCL 50 MG PO TABS
50.0000 mg | ORAL_TABLET | Freq: Once | ORAL | Status: AC
  Administered 2013-09-24: 50 mg via ORAL
  Filled 2013-09-24: qty 1

## 2013-09-24 NOTE — ED Provider Notes (Signed)
CSN: [id number]     Arrival date & time 09/24/13  1437 History   First MD Initiated Contact with Patient 09/24/13 1457     Chief Complaint  Patient presents with  . Shoulder Injury   (Consider location/radiation/quality/duration/timing/severity/associated sxs/prior Treatment) HPI Comments: Patient presents to the ER by ambulance after a fall. Patient reports that she was walking back from the bathroom and suddenly found her self on the ground. She is not sure what caused the fall. She does not think there was any loss of consciousness. She is complaining of severe right shoulder pain. Pain is constant, worsened with movement. It did not improve with fentanyl 25 mcg IV by EMS. Patient denies any headache, neck pain, back pain. She denies hip and lower extremity injury. She has not been experiencing any chest pain, palpitations, shortness of breath.  Patient is a 79 y.o. female presenting with shoulder injury.  Shoulder Injury    Past Medical History  Diagnosis Date  . Unspecified hypothyroidism 06/05/2005  . Unspecified vitamin D deficiency 02/03/2012  . Other malaise and fatigue 10/09/2009  . Other and unspecified hyperlipidemia 07/10/2009  . Backache, unspecified 04/10/2009  . Unspecified hearing loss 12/05/2008  . Herpes zoster with other nervous system complications(053.19) 09/12/2008  . Unspecified essential hypertension 11/02/2007  . Rash and other nonspecific skin eruption 11/02/2007  . Shortness of breath 11/02/2007  . Senile osteoporosis 07/09/2006  . Cervicalgia 09/12/2005  . Memory deficit   . Unspecified constipation   . Delusion 06/04/2013   Past Surgical History  Procedure Laterality Date  . Back surgery  2006    Dr. Lequita Halt  . Total knee arthroplasty Left 09/15/2002  . Laminectomy  03/20/2005    Centtal with Bil L3 & L4 nerve root decompression  Dr. Otelia Sergeant  . Total knee arthroplasty  2000    Dr. Jerl Santos  . Spine surgery  1991    Dr. Otelia Sergeant  . Spine surgery  1996    Dr.  Salli Real  . Kidney stone surgery  1959    Dr. Elige Radon  . Cataract extraction, bilateral     Family History  Problem Relation Age of Onset  . Heart disease Father    History  Substance Use Topics  . Smoking status: Former Smoker    Quit date: 02/22/1989  . Smokeless tobacco: Never Used  . Alcohol Use: No   OB History   Grav Para Term Preterm Abortions TAB SAB Ect Mult Living                 Review of Systems  Musculoskeletal: Positive for arthralgias.  All other systems reviewed and are negative.    Allergies  Codeine; Darvocet; Penicillins; Percocet; Sulfa antibiotics; Sulfites; and Tobramycin-dexamethasone  Home Medications   Current Outpatient Rx  Name  Route  Sig  Dispense  Refill  . HYDROcodone-acetaminophen (NORCO/VICODIN) 5-325 MG per tablet   Oral   Take 1 tablet by mouth once.           1 dose given for shoulder pain   . levothyroxine (SYNTHROID, LEVOTHROID) 50 MCG tablet   Oral   Take 50 mcg by mouth daily before breakfast.         . Multiple Vitamins-Minerals (CENTRUM SILVER PO)      Take one tablet once daily         . QUEtiapine (SEROQUEL) 50 MG tablet      2 (two) times daily.          . sertraline (  ZOLOFT) 100 MG tablet   Oral   Take 100 mg by mouth daily.          BP 95/44  Pulse 40  Temp(Src) 98 F (36.7 C)  Resp 16  Wt 140 lb (63.504 kg)  SpO2 93% Physical Exam  Constitutional: She is oriented to person, place, and time. She appears well-developed and well-nourished. No distress.  HENT:  Head: Normocephalic and atraumatic.  Right Ear: Hearing normal.  Left Ear: Hearing normal.  Nose: Nose normal.  Mouth/Throat: Oropharynx is clear and moist and mucous membranes are normal.  Eyes: Conjunctivae and EOM are normal. Pupils are equal, round, and reactive to light.  Neck: Normal range of motion. Neck supple.  Cardiovascular: Regular rhythm, S1 normal and S2 normal.  Exam reveals no gallop and no friction rub.   No murmur  heard. Pulmonary/Chest: Effort normal and breath sounds normal. No respiratory distress. She exhibits no tenderness.  Abdominal: Soft. Normal appearance and bowel sounds are normal. There is no hepatosplenomegaly. There is no tenderness. There is no rebound, no guarding, no tenderness at McBurney's point and negative Murphy's sign. No hernia.  Musculoskeletal:       Right shoulder: She exhibits decreased range of motion, tenderness and bony tenderness.  Neurological: She is alert and oriented to person, place, and time. She has normal strength. No cranial nerve deficit or sensory deficit. Coordination normal. GCS eye subscore is 4. GCS verbal subscore is 5. GCS motor subscore is 6.  Skin: Skin is warm, dry and intact. No rash noted. No cyanosis.  Psychiatric: She has a normal mood and affect. Her speech is normal and behavior is normal. Thought content normal.    ED Course  Procedures (including critical care time) Labs Review Labs Reviewed  CBC WITH DIFFERENTIAL - Abnormal; Notable for the following:    RDW 16.5 (*)    All other components within normal limits  COMPREHENSIVE METABOLIC PANEL - Abnormal; Notable for the following:    Sodium 134 (*)    Glucose, Bld 103 (*)    BUN 25 (*)    GFR calc non Af Amer 52 (*)    GFR calc Af Amer 60 (*)    All other components within normal limits  TROPONIN I  URINALYSIS, ROUTINE W REFLEX MICROSCOPIC   Imaging Review Dg Shoulder Right  09/24/2013   CLINICAL DATA:  Shoulder pain status post fall  EXAM: RIGHT SHOULDER - 2+ VIEW  COMPARISON:  None.  FINDINGS: A comminuted impacted humeral head fracture with intra-articular extension and lateral angulation is appreciated. Degenerative changes appreciated on acromioclavicular joint.  IMPRESSION: Humeral head fracture.   Electronically Signed   By: Salome Holmes M.D.   On: 09/24/2013 15:08    EKG Interpretation    Date/Time:  Friday September 24 2013 15:10:58 EST Ventricular Rate:  37 PR  Interval:  148 QRS Duration: 100 QT Interval:  507 QTC Calculation: 398 R Axis:   25 Text Interpretation:  Sinnus Bradycardia Otherwise normal ECG bradycardia is more pronounced than previous Confirmed by Ronnette Rump  MD, Bryton Waight ([id number]) on 09/24/2013 3:17:42 PM            MDM  Diagnosis: Right proximal humerus fracture  The patient presents to the ER for evaluation after a fall. She is not sure caused the fall. She does not, however, think that she passed out or had loss of consciousness after the fall. There is no headache or signs of trauma to the head. She appears directly  onto her right arm, complaining only of right shoulder pain. X-ray shows a proximal humerus fracture.  Patient was seen to be bradycardic on arrival. Reviewing her records, however, reveals that her heart rate is bradycardic. Daughter was aware of this. She did not have any significant change in her rhythm during this period of observation here in the ER. Workup was otherwise unremarkable. Patient has ambulated here in the ER without difficulty. Some of her vital sign changes are likely secondary to the fentanyl given during transport. Patient has multiple medication allergies/sensitivity. She was given Ultram here when necessary for pain will continue at home. Followup with her orthopedic surgeon.    Gilda Crease, MD 09/24/13 [phone]

## 2013-09-24 NOTE — ED Notes (Signed)
Pt from [facility] with c/o fall. Pt tripped and fell. Right shoulder with obvious protrusion. Pt given Fentanyl IV in route. No LOC. HR in 40's, BP 98 systolic.

## 2013-09-24 NOTE — Progress Notes (Signed)
Call to patient's room after a fall. Patient reports walking out of the bathroom and suddenly found herself flying through the air and falling on her right shoulder. Patient describes immediate pain in her shoulder, called for help. She is unsure how long it was before help arrived.   Patient denied headache, chest pian, SOB, nausea, hip or knee pain. She was able to move left upper extremity, and both lower extremities fully without pain. Pain localized to right shoulder, extreme pain with any movement. Ice applied to shoulder, Given hydrocodone/acetaminophen 5/325mg  while awaiting arrival of EMS.

## 2013-09-24 NOTE — Discharge Instructions (Signed)
Bradycardia Bradycardia is a term for a heart rate (pulse) that, in adults, is slower than 60 beats per minute. A normal rate is 60 to 100 beats per minute. A heart rate below 60 beats per minute may be normal for some adults with healthy hearts. If the rate is too slow, the heart may have trouble pumping the volume of blood the body needs. If the heart rate gets too low, blood flow to the brain may be decreased and may make you feel lightheaded, dizzy, or faint. The heart has a natural pacemaker in the top of the heart called the SA node (sinoatrial or sinus node). This pacemaker sends out regular electrical signals to the muscle of the heart, telling the heart muscle when to beat (contract). The electrical signal travels from the upper parts of the heart (atria) through the AV node (atrioventricular node), to the lower chambers of the heart (ventricles). The ventricles squeeze, pumping the blood from your heart to your lungs and to the rest of your body. CAUSES   Problem with the heart's electrical system.  Problem with the heart's natural pacemaker.  Heart disease, damage, or infection.  Medications.  Problems with minerals and salts (electrolytes). SYMPTOMS   Fainting (syncope).  Fatigue and weakness.  Shortness of breath (dyspnea).  Chest pain (angina).  Drowsiness.  Confusion. DIAGNOSIS   An electrocardiogram (ECG) can help your caregiver determine the type of slow heart rate you have.  If the cause is not seen on an ECG, you may need to wear a heart monitor that records your heart rhythm for several hours or days.  Blood tests. TREATMENT   Electrolyte supplements.  Medications.  Withholding medication which is causing a slow heart rate.  Pacemaker placement. SEEK IMMEDIATE MEDICAL CARE IF:   You feel lightheaded or faint.  You develop an irregular heart rate.  You feel chest pain or have trouble breathing. MAKE SURE YOU:   Understand these  instructions.  Will watch your condition.  Will get help right away if you are not doing well or get worse. Document Released: 06/22/2002 Document Revised: 12/23/2011 Document Reviewed: 05/18/2008 [facility] Patient Information 2014 [place], [place].  Humerus Fracture, Treated with Immobilization The humerus is the large bone in your upper arm. You have a broken (fractured) humerus. These fractures are easily diagnosed with X-rays. TREATMENT  Simple fractures which will heal without disability are treated with simple immobilization. Immobilization means you will wear a cast, splint, or sling. You have a fracture which will do well with immobilization. The fracture will heal well simply by being held in a good position until it is stable enough to begin range of motion exercises. Do not take part in activities which would further injure your arm.  HOME CARE INSTRUCTIONS   Put ice on the injured area.  Put ice in a plastic bag.  Place a towel between your skin and the bag.  Leave the ice on for 15-20 minutes, 03-04 times a day.  If you have a cast:  Do not scratch the skin under the cast using sharp or pointed objects.  Check the skin around the cast every day. You may put lotion on any red or sore areas.  Keep your cast dry and clean.  If you have a splint:  Wear the splint as directed.  Keep your splint dry and clean.  You may loosen the elastic around the splint if your fingers become numb, tingle, or turn cold or blue.  If you have  a sling:  Wear the sling as directed.  Do not put pressure on any part of your cast or splint until it is fully hardened.  Your cast or splint can be protected during bathing with a plastic bag. Do not lower the cast or splint into water.  Only take over-the-counter or prescription medicines for pain, discomfort, or fever as directed by your caregiver.  Do range of motion exercises as instructed by your caregiver.  Follow up as directed by  your caregiver. This is very important in order to avoid permanent injury or disability and chronic pain. SEEK IMMEDIATE MEDICAL CARE IF:   Your skin or nails in the injured arm turn blue or gray.  Your arm feels cold or numb.  You develop severe pain in the injured arm.  You are having problems with the medicines you were given. MAKE SURE YOU:   Understand these instructions.  Will watch your condition.  Will get help right away if you are not doing well or get worse. Document Released: 01/06/2001 Document Revised: 12/23/2011 Document Reviewed: 11/14/2010 [facility] Patient Information 2014 [place], [place].

## 2013-09-24 NOTE — ED Notes (Signed)
Patient transported to X-ray 

## 2013-09-24 NOTE — ED Notes (Signed)
Bed: WA02 Expected date:  Expected time:  Means of arrival:  Comments: EMS-dislocation

## 2013-09-27 ENCOUNTER — Encounter: Payer: Medicare Other | Admitting: Internal Medicine

## 2013-09-28 ENCOUNTER — Encounter: Payer: Self-pay | Admitting: Geriatric Medicine

## 2013-09-28 ENCOUNTER — Non-Acute Institutional Stay (SKILLED_NURSING_FACILITY): Payer: Medicare Other | Admitting: Geriatric Medicine

## 2013-09-28 DIAGNOSIS — E039 Hypothyroidism, unspecified: Secondary | ICD-10-CM

## 2013-09-28 DIAGNOSIS — K5900 Constipation, unspecified: Secondary | ICD-10-CM

## 2013-09-28 DIAGNOSIS — S42209A Unspecified fracture of upper end of unspecified humerus, initial encounter for closed fracture: Secondary | ICD-10-CM

## 2013-09-28 HISTORY — DX: Unspecified fracture of upper end of unspecified humerus, initial encounter for closed fracture: S42.209A

## 2013-09-28 LAB — CBC AND DIFFERENTIAL
HCT: 37 % (ref 36–46)
Hemoglobin: 12.4 g/dL (ref 12.0–16.0)
Platelets: 265 10*3/uL (ref 150–399)
WBC: 10.5 10^3/mL

## 2013-09-28 LAB — BASIC METABOLIC PANEL
BUN: 19 mg/dL (ref 4–21)
Glucose: 82 mg/dL
Sodium: 132 mmol/L — AB (ref 137–147)

## 2013-09-28 LAB — TSH: TSH: 15.58 u[IU]/mL — AB (ref 0.41–5.90)

## 2013-09-28 MED ORDER — POLYETHYLENE GLYCOL 3350 17 GM/SCOOP PO POWD: 17.0000 g | Freq: Every day | ORAL | Status: DC

## 2013-09-28 MED ORDER — LEVOTHYROXINE SODIUM 75 MCG PO TABS: 75.0000 ug | ORAL_TABLET | Freq: Every day | ORAL | Status: DC

## 2013-09-28 NOTE — Progress Notes (Signed)
Patient ID: [name], female   DOB: Mar 17, 1927, 79 y.o.   MRN: [id number]   [name] [phone])  Code Status: Living Will, Full Code Contact Information   Name Relation Home Work [place]  [phone]  [phone]      Chief Complaint  Patient presents with  . Humerus fracture  . Bradycardia  . Hypothyroidism    HPI: This is a 79 y.o. female resident of [name]  Independent Living  Section, currently residing in the rehabilitation section. This patient had a fall in her assisted-living apartment on Friday, December 12, resulting in a right humerus fracture. She was transferred to the emergency department where this fracture was confirmed. Treatment has been immobilization in a sling, she is to followup with orthopedics later today. Patient is requiring assistance with all ADLs due to mobilization of her right arm. Pain is being managed with Ultram 100 mg p.r.n.; Patient has received this medication twice a day for the last few days, the pain is adequately controlled unless she moves her arm.  The patient's heart rate has been noted to be lower than usual. Review of record shows patient's heart rate in the 60s for the past several months, since December 12 heart rate is noted to be 45-55. EKG yesterday confirms sinus bradycardia. Aricept was stopped as bradycardia and sometimes a side effect of this medication. Lab was performed earlier this morning, result was just returned with a TSH elevated at 15.5. Review of patient's record shows she has been receiving her Synthroid supplement 50 mcg daily.   Allergies  Allergen Reactions  . Codeine   . Darvocet [Propoxyphene-Acetaminophen]   . Penicillins   . Percocet [Oxycodone-Acetaminophen]   . Sulfa Antibiotics   . Sulfites   . Tobramycin-Dexamethasone    Medications    Medication List       This list is accurate as of: 09/28/13 12:49 PM.  Always  use your most recent med list.               CENTRUM SILVER PO  Take one tablet once daily     levothyroxine 75 MCG tablet  Commonly known as:  SYNTHROID, LEVOTHROID  Take 1 tablet (75 mcg total) by mouth daily.     polyethylene glycol powder powder  Commonly known as:  GLYCOLAX/MIRALAX  Take 17 g by mouth daily.     QUEtiapine 50 MG tablet  Commonly known as:  SEROQUEL  2 (two) times daily.     sertraline 100 MG tablet  Commonly known as:  ZOLOFT  Take 100 mg by mouth daily.     traMADol 50 MG tablet  Commonly known as:  ULTRAM  Take 100 mg by mouth every 4 (four) hours as needed.        DATA REVIEWED  Radiologic Exams 09/24/2013 RIGHT SHOULDER - 2+ VIEW  COMPARISON:  None.  FINDINGS: A comminuted impacted humeral head fracture with intra-articular extension and lateral angulation is appreciated. Degenerative changes appreciated on acromioclavicular joint.   Cardiovascular Exams:   Laboratory Studies  [facility] 02/16/2013 TSH 15.24  04/29/2013 WBC 5.6, hemoglobin 13.9, hematocrit 41.1, platelet 290   Glucose 90, BUN 16, creatinine 0.76, sodium 140, potassium 4.3. Protein/LFTs WNL.   TSH 1.08   Urinalysis WNL  05/25/2013 TSH 0.712   Vitamin D 44  Lab Results- Hospital  Component Value   WBC 9.9   HGB 13.6   HCT 40.4   PLT [facility]  GLUCOSE 103*   ALT 20   AST 24   NA 134*   K 4.2   CL 99   CREATININE 0.97   BUN 25*   CO2 22   Lab Results  Component Value Date   WBC 10.5 09/28/2013   HGB 12.4 09/28/2013   HCT 37 09/28/2013   PLT 265 09/28/2013        GLUCOSE 82 09/24/2013   NA 132* 09/28/2013   K 5.2 09/28/2013   CREATININE 0.7 09/28/2013   BUN 19 09/28/2013        TSH 15.58* 09/28/2013   Past Medical History  Diagnosis Date  . Unspecified hypothyroidism 06/05/2005  . Unspecified vitamin D deficiency 02/03/2012  . Other malaise and fatigue 10/09/2009  . Other and unspecified hyperlipidemia 07/10/2009  . Backache, unspecified  04/10/2009  . Unspecified hearing loss 12/05/2008  . Herpes zoster with other nervous system complications(053.19) 09/12/2008  . Unspecified essential hypertension 11/02/2007  . Rash and other nonspecific skin eruption 11/02/2007  . Shortness of breath 11/02/2007  . Senile osteoporosis 07/09/2006  . Cervicalgia 09/12/2005  . Memory deficit   . Unspecified constipation   . Delusion 06/04/2013   Past Surgical History  Procedure Laterality Date  . Back surgery  2006    Dr. Lequita Halt  . Total knee arthroplasty Left 09/15/2002  . Laminectomy  03/20/2005    Centtal with Bil L3 & L4 nerve root decompression  Dr. Otelia Sergeant  . Total knee arthroplasty  2000    Dr. Jerl Santos  . Spine surgery  1991    Dr. Otelia Sergeant  . Spine surgery  1996    Dr. Salli Real  . Kidney stone surgery  1959    Dr. Elige Radon  . Cataract extraction, bilateral     Family Status  Relation Status Death Age  . Daughter Alive   . Son Alive   . Daughter Alive   . Mother Deceased     old age  . Father Deceased 34  . Sister Deceased   . Brother Deceased    History   Social History Narrative   Patient is Widowed, homemaker.  [facility] at [name] since 05/2013, and previously lived in independent living apartment in [facility] since 2007    Stopped smoking in 1990s, no significant alcohol history    Patient has  Advanced planning documents: Living Will         Review of Systems  DATA OBTAINED: from patient, nurse, medical record GENERAL: Feels well   No fevers, fatigue, change in appetite or weight SKIN: No itch, rash or open wounds EYES: No eye pain, dryness or itching  No change in vision EARS: No earache, tinnitus, change in hearing NOSE: No congestion, drainage or bleeding MOUTH/THROAT: No mouth or tooth pain   No sore throat    No difficulty chewing or swallowing RESPIRATORY: No cough, wheezing, SOB CARDIAC: No chest pain, palpitations  No edema. GI: No abdominal pain  No N/V/D  Constipation , last BM yesterday after milk of magnesia and suppository No heartburn or reflux  GU: No dysuria, frequency or urgency  No change in urine volume or character  MUSCULOSKELETAL:  Right shoulder pain with any movement.  No back pain  No muscle ache, pain, weakness  Gait is unsteady,  ambulating with assistance   NEUROLOGIC: No dizziness, fainting, headache,No change in mental status (mild dementia).  PSYCHIATRIC: No feelings of anxiety, depression Sleeps well.  No behavior issue.    Physical  Exam Filed Vitals:   09/28/13 1205  BP: 100/49  Pulse: 48  Temp: 98.2 F (36.8 C)  Resp: 15  Weight: 148 lb 3.2 oz (67.223 kg)  SpO2: 94%   Body mass index is 28.46 kg/(m^2). GENERAL APPEARANCE: No acute distress, appropriately groomed, normal body habitus. Alert, pleasant, conversant. SKIN: No diaphoresis, rash, unusual lesions, wounds HEAD: Normocephalic, atraumatic EYES: Conjunctiva/lids clear. Pupils round, reactive.  EARS: External exam WNL,. Hearing grossly normal. NOSE: No deformity or discharge. MOUTH/THROAT: Lips w/o lesions. Oral mucosa, tongue moist, w/o lesion. Oropharynx w/o redness or lesions.  NECK: Supple, full ROM. No thyroid tenderness, enlargement or nodule LYMPHATICS: No head, neck or supraclavicular adenopathy RESPIRATORY: Breathing is even, unlabored. Lung sounds are clear and full.  CARDIOVASCULAR: Heart RRR, bradycardic 48 beats per minute. No murmur or extra heart sounds  ARTERIAL: No carotid bruit. Right radial 2+.  EDEMA: No peripheral or periorbital edema. No ascites GASTROINTESTINAL: Abdomen is soft, non-tender, not distended w/ decreased bowel sounds. MUSCULOSKELETAL:  right upper extremity immobilized in a sling, hand grip is strong, patient with significant pain with any movement of this arm Moves all other extremities with full ROM, strength and tone. Back is without kyphosis, scoliosis or spinal process tenderness.  NEUROLOGIC: Not oriented to time,  is oriented to place, person. Cranial nerves 2-12 grossly intact, speech clear, no tremor.  PSYCHIATRIC: Mood and affect appropriate to situation  ASSESSMENT/PLAN  Unspecified constipation A daily laxative due to the medication and decreased mobility  Unspecified hypothyroidism TSH with significant elevation, this is a large big change since last measure a few months ago. Patient has been receiving her daily Synthroid supplement. With the medication changes to account for this change. Decreased thyroid activity can account for patient's bradycardia. Will ask lab to add on T3 and T4 to today's results. Also increase Synthroid to 75 mcg daily  Closed fracture of unspecified part of upper end of humerus Right proximal humerus fracture 09/24/2013 after a mechanical fall. The patient's arm is currently immobilized in a sling, right hand grip is strong and with adequate neurovascular perfusion. Patient is for orthopedic evaluation this afternoon with Dr. Otelia Sergeant. Pain medication is currently Ultram 100 mg every 4 hours as needed. Patient is being assessed frequently for pain as she does not request medication.     Follow up: AS needed  Jozie Wulf T.Marcy Sookdeo, NP-C 09/28/2013

## 2013-09-28 NOTE — Assessment & Plan Note (Signed)
TSH with significant elevation, this is a large big change since last measure a few months ago. Patient has been receiving her daily Synthroid supplement. With the medication changes to account for this change. Decreased thyroid activity can account for patient's bradycardia. Will ask lab to add on T3 and T4 to today's results. Also increase Synthroid to 75 mcg daily

## 2013-09-28 NOTE — Assessment & Plan Note (Addendum)
Right proximal humerus fracture 09/24/2013 after a mechanical fall. The patient's arm is currently immobilized in a sling, right hand grip is strong and with adequate neurovascular perfusion. Patient is for orthopedic evaluation this afternoon with Dr. Otelia Sergeant. Pain medication is currently Ultram 100 mg every 4 hours as needed. Patient is being assessed frequently for pain as she does not request medication.

## 2013-09-28 NOTE — Assessment & Plan Note (Signed)
A daily laxative due to the medication and decreased mobility

## 2013-10-05 ENCOUNTER — Other Ambulatory Visit: Payer: Self-pay | Admitting: *Deleted

## 2013-10-05 ENCOUNTER — Non-Acute Institutional Stay (SKILLED_NURSING_FACILITY): Payer: Medicare Other | Admitting: Geriatric Medicine

## 2013-10-05 ENCOUNTER — Encounter: Payer: Self-pay | Admitting: Geriatric Medicine

## 2013-10-05 DIAGNOSIS — E039 Hypothyroidism, unspecified: Secondary | ICD-10-CM

## 2013-10-05 DIAGNOSIS — S42209A Unspecified fracture of upper end of unspecified humerus, initial encounter for closed fracture: Secondary | ICD-10-CM

## 2013-10-05 MED ORDER — TRAMADOL HCL 50 MG PO TABS: ORAL_TABLET | ORAL | Status: DC

## 2013-10-05 NOTE — Progress Notes (Signed)
Patient ID: [name], female   DOB: 01-02-1927, 79 y.o.   MRN: [id number]   [facility] [phone])  Code Status: Living Will, Full Code     Contact Information   Name Relation Home Work [place]  [phone]  [phone]      Chief Complaint  Patient presents with  . Rt. humerus fracture  . Altered Mental Status    HPI: This is a 79 y.o. female resident of [facility], Assisted Living  Independent Living  Section, currently residing in the rehabilitation section. This patient had a fall in her assisted-living apartment on Friday, December 12, resulting in a right humerus fracture. She was transferred to the emergency department where this fracture was confirmed. Treatment has been immobilization in a sling, she is to followup with orthopedics later today. Patient is requiring assistance with all ADLs due to mobilization of her right arm. Pain is being managed with Ultram 100 mg p.r.n. Last visit:  Unspecified constipation A daily laxative due to the medication and decreased mobility  Unspecified hypothyroidism TSH with significant elevation, this is a large big change since last measure a few months ago. Patient has been receiving her daily Synthroid supplement. With the medication changes to account for this change. Decreased thyroid activity can account for patient's bradycardia. Will ask lab to add on T3 and T4 to today's results. Also increase Synthroid to 75 mcg daily  Closed fracture of unspecified part of upper end of humerus Right proximal humerus fracture 09/24/2013 after a mechanical fall. The patient's arm is currently immobilized in a sling, right hand grip is strong and with adequate neurovascular perfusion. Patient is for orthopedic evaluation this afternoon with Dr. Otelia Sergeant. Pain medication is currently Ultram 100 mg every 4 hours as needed. Patient is being assessed frequently for pain as she does not  request medication.  Allergies  Allergen Reactions  . Codeine   . Darvocet [Propoxyphene N-Acetaminophen]   . Penicillins   . Percocet [Oxycodone-Acetaminophen]   . Sulfa Antibiotics   . Sulfites   . Tobramycin-Dexamethasone    Medications reviewed   DATA REVIEWED  Radiologic Exams 09/24/2013 RIGHT SHOULDER - 2+ VIEW  COMPARISON:  None.  FINDINGS: A comminuted impacted humeral head fracture with intra-articular extension and lateral angulation is appreciated. Degenerative changes appreciated on acromioclavicular joint.   Cardiovascular Exams:   Laboratory Studies  [facility] 02/16/2013 TSH 15.24  04/29/2013 WBC 5.6, hemoglobin 13.9, hematocrit 41.1, platelet 290   Glucose 90, BUN 16, creatinine 0.76, sodium 140, potassium 4.3. Protein/LFTs WNL.   TSH 1.08   Urinalysis WNL  05/25/2013 TSH 0.712   Vitamin D 44  Lab Results- Hospital  Component Value   WBC 9.9   HGB 13.6   HCT 40.4   PLT 215       GLUCOSE 103*   ALT 20   AST 24   NA 134*   K 4.2   CL 99   CREATININE 0.97   BUN 25*   CO2 22   Lab Results- [facility] 09/28/2013  Component Value   WBC 10.5   HGB 12.4   HCT 37   PLT 265       GLUCOSE 82   NA 132*   K 5.2   CREATININE 0.7   BUN 19       TSH 15.58*   Lab results- [facility] 10/05/2013 T4, total 6.3 T3, total 47.4 TSH 19.99   Review of Systems  DATA OBTAINED:  from patient, nurse, medical record GENERAL: Feels well   No fevers, fatigue, change in appetite or weight SKIN: No itch, rash or open wounds EYES: No eye pain, dryness or itching  No change in vision EARS: No earache, tinnitus, change in hearing NOSE: No congestion, drainage or bleeding MOUTH/THROAT: No mouth or tooth pain   No sore throat    No difficulty chewing or swallowing RESPIRATORY: No cough, wheezing, SOB CARDIAC: No chest pain, palpitations  No edema. GI: No abdominal pain  No N/V/D Constipation better, last BM yesterday  No heartburn or reflux  GU: No dysuria,  frequency or urgency  No change in urine volume or character  MUSCULOSKELETAL:  Right shoulder pain with any movement.  No back pain  No muscle ache, pain, weakness  Gait is unsteady,  ambulating with assistance   NEUROLOGIC: No dizziness, fainting, headache,No change in mental status (mild dementia).  PSYCHIATRIC: No feelings of anxiety, depression Sleeps well.  No behavior issue.    Physical Exam Filed Vitals:   10/05/13 1223  BP: 112/57  Pulse: 53  Temp: 98.6 F (37 C)  Resp: 16  Weight: 145 lb 3.2 oz (65.862 kg)   Body mass index is 27.88 kg/(m^2). GENERAL APPEARANCE: No acute distress, appropriately groomed, normal body habitus. Alert, pleasant, conversant. SKIN: No diaphoresis, rash, unusual lesions, wounds HEAD: Normocephalic, atraumatic EYES: Conjunctiva/lids clear. Pupils round, reactive.  EARS: External exam WNL,. Hearing grossly normal. NOSE: No deformity or discharge. MOUTH/THROAT: Lips w/o lesions. Oral mucosa, tongue moist, w/o lesion. Oropharynx w/o redness or lesions.  NECK: Supple, full ROM. No thyroid tenderness, enlargement or nodule LYMPHATICS: No head, neck or supraclavicular adenopathy RESPIRATORY: Breathing is even, unlabored. Lung sounds are clear and full.  CARDIOVASCULAR: Heart RRR, bradycardic 48 beats per minute. No murmur or extra heart sounds  ARTERIAL: No carotid bruit. Right radial 2+.  EDEMA: No peripheral or periorbital edema. No ascites GASTROINTESTINAL: Abdomen is soft, non-tender, not distended w/ decreased bowel sounds. MUSCULOSKELETAL:  right upper extremity immobilized in a sling, hand grip is strong, patient with significant pain with any movement of this arm Moves all other extremities with full ROM, strength and tone. Back is without kyphosis, scoliosis or spinal process tenderness.  NEUROLOGIC: Not oriented to time, is oriented to place, person. Cranial nerves 2-12 grossly intact, speech clear, no tremor.  PSYCHIATRIC: Mood and affect  appropriate to situation  ASSESSMENT/PLAN  No problem-specific assessment & plan notes found for this encounter.    Follow up: As needed  Hiawatha Merriott T.Mariesa Grieder, NP-C 10/05/2013  This encounter was created in error - please disregard.

## 2013-10-08 ENCOUNTER — Encounter: Payer: Self-pay | Admitting: Geriatric Medicine

## 2013-10-08 NOTE — Progress Notes (Signed)
Patient ID: [name], female   DOB: Sep 27, 1927, 79 y.o.   MRN: [id number]  [facility] [phone])  Code Status: Living Will, Full Code     Contact Information   Name Relation Home Work [place]  [phone]  [phone]      Chief Complaint  Patient presents with  . Humerus fracture    HPI: This is a 79 y.o. female resident of [facility], Assisted Living  Independent Living  Section, currently residing in the rehabilitation section due to a right humerus fracture.  Last visit:  Unspecified constipation A daily laxative due to the medication and decreased mobility  Unspecified hypothyroidism TSH with significant elevation, this is a big change since last measure a few months ago. Patient has been receiving her daily Synthroid supplement. With the medication changes to account for this change. Decreased thyroid activity can account for patient's bradycardia. Will ask lab to add on T3 and T4 to today's results. Also increase Synthroid to 75 mcg daily  Closed fracture of unspecified part of upper end of humerus Right proximal humerus fracture 09/24/2013 after a mechanical fall. The patient's arm is currently immobilized in a sling, right hand grip is strong and with adequate neurovascular perfusion. Patient is for orthopedic evaluation this afternoon with Dr. Otelia Sergeant. Pain medication is currently Ultram 100 mg every 4 hours as needed. Patient is being assessed frequently for pain as she does not request medication.  Since last visit, patient was evaluated by Dr. Otelia Sergeant, she is to continue rt. Arm immobilization, return for repeat x-ray 2-3 weeks. Continues receiving tramadol for pain management. Nurses report increased confusion, disorientation, significant anxiety/ agitation over the weekend. Patient reports she doesn't feel "quite right". Lab returned with low normal T4, low T3. Synthroid dose was increased last week.    Patient's HR range 53-83, mildly improved.    Allergies  Allergen Reactions  . Codeine   . Darvocet [Propoxyphene N-Acetaminophen]   . Penicillins   . Percocet [Oxycodone-Acetaminophen]   . Sulfa Antibiotics   . Sulfites   . Tobramycin-Dexamethasone    Medications Reviewed   DATA REVIEWED  Radiologic Exams 09/24/2013 RIGHT SHOULDER - 2+ VIEW  COMPARISON:  None.  FINDINGS: A comminuted impacted humeral head fracture with intra-articular extension and lateral angulation is appreciated. Degenerative changes appreciated on acromioclavicular joint.   Cardiovascular Exams:   Laboratory Studies  [facility] 02/16/2013 TSH 15.24  04/29/2013 WBC 5.6, hemoglobin 13.9, hematocrit 41.1, platelet 290   Glucose 90, BUN 16, creatinine 0.76, sodium 140, potassium 4.3. Protein/LFTs WNL.   TSH 1.08   Urinalysis WNL  05/25/2013 TSH 0.712   Vitamin D 44  Lab Results- Hospital  Component Value   WBC 9.9   HGB 13.6   HCT 40.4   PLT 215       GLUCOSE 103*   ALT 20   AST 24   NA 134*   K 4.2   CL 99   CREATININE 0.97   BUN 25*   CO2 22   Lab Results- [facility] 09/28/2013  Component Value   WBC 10.5   HGB 12.4   HCT 37   PLT 265       GLUCOSE 82   NA 132*   K 5.2   CREATININE 0.7   BUN 19       TSH 15.58*   T3 6.3   T4 47.4            Review  of Systems  DATA OBTAINED: from patient, nurse, medical record GENERAL: Feels well   No fevers, fatigue, change in appetite or weight SKIN: No itch, rash or open wounds EYES: No eye pain, dryness or itching  No change in vision EARS: No earache, tinnitus, change in hearing NOSE: No congestion, drainage or bleeding MOUTH/THROAT: No mouth or tooth pain   No sore throat    No difficulty chewing or swallowing RESPIRATORY: No cough, wheezing, SOB CARDIAC: No chest pain, palpitations  No edema. GI: No abdominal pain  No N/V/D Constipation improved,  No heartburn or reflux  GU: No dysuria, frequency or urgency  No change in  urine volume or character  MUSCULOSKELETAL:  Right shoulder pain with any movement.  No back pain  No muscle ache, pain, weakness  Gait is unsteady,  ambulating with assistance   NEUROLOGIC: No dizziness, fainting, headache,No change in mental status (mild dementia).  PSYCHIATRIC: Increased anxiety, agitation Sleeping OK.      Physical Exam Filed Vitals:   10/05/13 1939  BP: 112/57  Pulse: 53  Temp: 98.6 F (37 C)  Resp: 16  Weight: 145 lb 3.2 oz (65.862 kg)   Body mass index is 27.88 kg/(m^2).  GENERAL APPEARANCE: No acute distress, appropriately groomed, normal body habitus. Alert, pleasant, conversant. SKIN: No diaphoresis, rash, unusual lesions, wounds HEAD: Normocephalic, atraumatic EYES: Conjunctiva/lids clear. Pupils round, reactive.  EARS: External exam WNL,. Hearing grossly normal. NOSE: No deformity or discharge. MOUTH/THROAT: Lips w/o lesions. Oral mucosa, tongue moist, w/o lesion. Oropharynx w/o redness or lesions.  NECK: Supple, full ROM. No thyroid tenderness, enlargement or nodule LYMPHATICS: No head, neck or supraclavicular adenopathy RESPIRATORY: Breathing is even, unlabored. Lung sounds are clear and full.  CARDIOVASCULAR: Heart RRR, No murmur or extra heart sounds  ARTERIAL: No carotid bruit. Right radial 2+.  EDEMA: No peripheral or periorbital edema. No ascites GASTROINTESTINAL: Abdomen is soft, non-tender, not distended w/ decreased bowel sounds. MUSCULOSKELETAL:  right upper extremity immobilized in a sling, hand grip is strong, patient with significant pain with any movement of this arm Moves all other extremities with full ROM, strength and tone. Back is without kyphosis, scoliosis or spinal process tenderness.  NEUROLOGIC: Not oriented to time, is oriented to place, person. Cranial nerves 2-12 grossly intact, speech clear, no tremor.  PSYCHIATRIC: Mood and affect appropriate to situation  ASSESSMENT/PLAN  Unspecified hypothyroidism T3, T4 confirm  hypothyroidism, continue Synthroid, recheck TSH 6 weeks  Closed fracture of unspecified part of upper end of humerus Patient with increased confusion, disorientation, anxiety, agitation- may be related to tramadol, reduce dose, schedule Tylenol. Continue arm immobilization, follow with Dr.Nitka as scheduled    Follow up: AS needed  Nickol Collister T.Shauntea Lok, NP-C 10/08/2013

## 2013-10-08 NOTE — Assessment & Plan Note (Addendum)
Patient with increased confusion, disorientation, anxiety, agitation- may be related to tramadol, reduce dose, schedule Tylenol. Continue arm immobilization, follow with Dr.Nitka as scheduled

## 2013-10-08 NOTE — Assessment & Plan Note (Signed)
T3, T4 confirm hypothyroidism, continue Synthroid, recheck TSH 6 weeks

## 2013-10-19 ENCOUNTER — Encounter: Payer: Self-pay | Admitting: Geriatric Medicine

## 2013-10-19 ENCOUNTER — Non-Acute Institutional Stay (SKILLED_NURSING_FACILITY): Payer: Medicare Other | Admitting: Geriatric Medicine

## 2013-10-19 DIAGNOSIS — S42209A Unspecified fracture of upper end of unspecified humerus, initial encounter for closed fracture: Secondary | ICD-10-CM

## 2013-10-19 DIAGNOSIS — R413 Other amnesia: Secondary | ICD-10-CM

## 2013-10-19 NOTE — Assessment & Plan Note (Signed)
Patient short-term memory deficit continues to play a part in her care. She will benefit from return to her familiar environment.

## 2013-10-19 NOTE — Assessment & Plan Note (Signed)
Pain is well managed, patient has regained nearly independent function with ADLs. She is ambulating safely. Plan discharge back to assisted living apartment tomorrow, assisted living staff can assist with upper body dressing. Continue arm in sling until re-evaluated by orthopedics

## 2013-10-19 NOTE — Progress Notes (Signed)
Patient ID: [name], female   DOB: 08/21/1927, 80 y.o.   MRN: [id number]  [facility] [phone])  Code Status: Living Will, Full Code Contact Information   Name Relation Home Work [place]  [phone]  [phone]      Chief Complaint  Patient presents with  . Humerus fracture  . Dementia    HPI: This is a 80 y.o. female resident of [facility], Assisted Living  Independent Living  Section, currently residing in the rehabilitation section due to a right humerus fracture.  Last visit:  Unspecified hypothyroidism T3, T4 confirm hypothyroidism, continue Synthroid, recheck TSH 6 weeks  Closed fracture of unspecified part of upper end of humerus Patient with increased confusion, disorientation, anxiety, agitation- may be related to tramadol, reduce dose, schedule Tylenol. Continue arm immobilization, follow with Dr.Nitka as scheduled  Since last visit patient has experienced less discomfort in her left arm, she continueS to keep it immobilized in a sling at all times. Therapy has been working with her in terms of ambulation and ADLs. She is nearly independent with her ADLs; does require assistance with upper body dressing and some assistance with toileting. Her balance is improved significantly; she's been cleared by PT to date ambulating independently. Patient has had some nighttime confusion, at times very agitated. This patient does have a memory deficit with history of significant agitation and paranoia. Since she is now able to ambulate independently bed and chair alarms will be removed.   Allergies  Allergen Reactions  . Codeine   . Darvocet [Propoxyphene N-Acetaminophen]   . Penicillins   . Percocet [Oxycodone-Acetaminophen]   . Sulfa Antibiotics   . Sulfites   . Tobramycin-Dexamethasone    MEDICATIONS - Reviewed   DATA REVIEWED  Radiologic Exams 09/24/2013 RIGHT SHOULDER - 2+ VIEW  COMPARISON:   None.  FINDINGS: A comminuted impacted humeral head fracture with intra-articular extension and lateral angulation is appreciated. Degenerative changes appreciated on acromioclavicular joint.   Cardiovascular Exams:   Laboratory Studies  [facility] 02/16/2013 TSH 15.24  04/29/2013 WBC 5.6, hemoglobin 13.9, hematocrit 41.1, platelet 290   Glucose 90, BUN 16, creatinine 0.76, sodium 140, potassium 4.3. Protein/LFTs WNL.   TSH 1.08   Urinalysis WNL  05/25/2013 TSH 0.712   Vitamin D 44  Lab Results- Hospital  Component Value   WBC 9.9   HGB 13.6   HCT 40.4   PLT 215       GLUCOSE 103*   ALT 20   AST 24   NA 134*   K 4.2   CL 99   CREATININE 0.97   BUN 25*   CO2 22   Lab Results- [facility] 09/28/2013  Component Value   WBC 10.5   HGB 12.4   HCT 37   PLT 265       GLUCOSE 82   NA 132*   K 5.2   CREATININE 0.7   BUN 19       TSH 15.58*   T3 6.3   T4 47.4            REVIEW OF SYSTEMS  DATA OBTAINED: from patient, nurse, medical record GENERAL: Feels well   No fevers, fatigue, change in appetite or weight SKIN: No itch, rash or open wounds EYES: No eye pain, dryness or itching  No change in vision EARS: No earache, tinnitus, change in hearing NOSE: No congestion, drainage or bleeding MOUTH/THROAT: No mouth or tooth pain  No sore throat    No difficulty chewing or swallowing RESPIRATORY: No cough, wheezing, SOB CARDIAC: No chest pain, palpitations  No edema. GI: No abdominal pain  No N/V/D Constipation improved,  No heartburn or reflux  GU: No dysuria, frequency or urgency  No change in urine volume or character  MUSCULOSKELETAL:  Less Right shoulder pain.  No back pain  No muscle ache, pain, weakness  Gait is steady, ambulating independently   NEUROLOGIC: No dizziness, fainting, headache,No change in mental status (mild dementia).  PSYCHIATRIC: Intermittent increased anxiety, agitation Sleeping OK.      PHYSICAL EXAM  Filed Vitals:   10/19/13 1734    BP: 130/54  Pulse: 62  Weight: 142 lb 3.2 oz (64.501 kg)  SpO2: 93%   Body mass index is 27.3 kg/(m^2).  GENERAL APPEARANCE: No acute distress, appropriately groomed, normal body habitus. Alert, pleasant, conversant. SKIN: No diaphoresis, rash, unusual lesions, wounds HEAD: Normocephalic, atraumatic EYES: Conjunctiva/lids clear. Pupils round, reactive.  EARS: External exam WNL,. Hearing grossly normal. NOSE: No deformity or discharge. MOUTH/THROAT: Lips w/o lesions. Oral mucosa, tongue moist, w/o lesion. Oropharynx w/o redness or lesions.  NECK: Supple, full ROM. No thyroid tenderness, enlargement or nodule LYMPHATICS: No head, neck or supraclavicular adenopathy RESPIRATORY: Breathing is even, unlabored. Lung sounds are clear and full.  CARDIOVASCULAR: Heart RRR, No murmur or extra heart sounds  ARTERIAL: No carotid bruit. Right radial 2+.  EDEMA: No peripheral or periorbital edema. No ascites GASTROINTESTINAL: Abdomen is soft, non-tender, not distended w/ decreased bowel sounds. MUSCULOSKELETAL:  right upper extremity immobilized in a sling, hand grip is strong,remains with some discomfort with general of this arm Moves all other extremities with full ROM, strength and tone. Back is without kyphosis, scoliosis or spinal process tenderness.  NEUROLOGIC: Not oriented to time, is oriented to place, person. Cranial nerves 2-12 grossly intact, speech clear, no tremor.  PSYCHIATRIC: Mood and affect appropriate to situation  ASSESSMENT/PLAN  Closed fracture of unspecified part of upper end of humerus Pain is well managed, patient has regained nearly independent function with ADLs. She is ambulating safely. Plan discharge back to assisted living apartment tomorrow, assisted living staff can assist with upper body dressing. Continue arm in sling until re-evaluated by orthopedics  Memory deficit Patient short-term memory deficit continues to play a part in her care. She will benefit from return  to her familiar environment.    Follow up: 2-3 weeks in [facility]  Gwin Eagon T.Enid Maultsby, NP-C 10/19/2013

## 2013-11-01 NOTE — Telephone Encounter (Signed)
Refill

## 2013-11-10 ENCOUNTER — Non-Acute Institutional Stay: Payer: Medicare Other | Admitting: Geriatric Medicine

## 2013-11-10 VITALS — BP 140/62 | HR 56 | Wt 144.0 lb

## 2013-11-10 DIAGNOSIS — E039 Hypothyroidism, unspecified: Secondary | ICD-10-CM

## 2013-11-10 DIAGNOSIS — S42209A Unspecified fracture of upper end of unspecified humerus, initial encounter for closed fracture: Secondary | ICD-10-CM

## 2013-11-10 DIAGNOSIS — R413 Other amnesia: Secondary | ICD-10-CM

## 2013-11-10 DIAGNOSIS — I10 Essential (primary) hypertension: Secondary | ICD-10-CM

## 2013-11-10 NOTE — Progress Notes (Signed)
Patient ID: [name], female   DOB: 08/22/1927, 80 y.o.   MRN: [id number]  [facility] [phone](12)  Code Status: Living Will, Full Code Contact Information   Name Relation Home Work [place]   [name]  [phone]  [phone]      Chief Complaint  Patient presents with  . Medical Managment of Chronic Issues    follow-up from Rehab for right humerus fracture    HPI: This is a 80 y.o. female resident of [facility]   Section, discharged from rehabilitation section after recovering from a right humerus fracture.  Last visit:  Closed fracture of unspecified part of upper end of humerus Pain is well managed, patient has regained nearly independent function with ADLs. She is ambulating safely. Plan discharge back to assisted living apartment tomorrow, assisted living staff can assist with upper body dressing. Continue arm in sling until re-evaluated by orthopedics  Memory deficit Patient short-term memory deficit continues to play a part in her care. She will benefit from return to her familiar environment.  Since last visit patient returned to her assisted living apartment. She continues to have some pain in her right upper arm, due to her memory issues often thinks she broke it again. Patient returned to Dr. Elroy ChannelNicdao January 16 he recommends he recommended nonweightbearing for one more month, no active range of motion and to keep the arm in the sling as a reminder for limited activity with the arm.  Most recent TSH was very elevated, Synthroid dose was increased. Followup labs scheduled 11/16/13. Patient's cognitive and functional status is essentially unchanged. She continues to be very mobile, independent with ADLs within the limitations of her right arm injury. Short-term memory is an issue, she is most often redirected easily. No recurrence of suspiciousness or paranoia. Review of facility record shows patient's vital  signs stable, including weight. She is eating adequately and sleeps well.   Allergies  Allergen Reactions  . Codeine   . Darvocet [Propoxyphene N-Acetaminophen]   . Penicillins   . Percocet [Oxycodone-Acetaminophen]   . Sulfa Antibiotics   . Sulfites   . Tobramycin-Dexamethasone    MEDICATIONS - Reviewed   DATA REVIEWED  Radiologic Exams 09/24/2013 RIGHT SHOULDER - 2+ VIEW  COMPARISON:  None.  FINDINGS: A comminuted impacted humeral head fracture with intra-articular extension and lateral angulation is appreciated. Degenerative changes appreciated on acromioclavicular joint.   Cardiovascular Exams:   Laboratory Studies  [facility] 02/16/2013 TSH 15.24  04/29/2013 WBC 5.6, hemoglobin 13.9, hematocrit 41.1, platelet 290   Glucose 90, BUN 16, creatinine 0.76, sodium 140, potassium 4.3. Protein/LFTs WNL.   TSH 1.08   Urinalysis WNL  05/25/2013 TSH 0.712   Vitamin D 44  Lab Results- Hospital  Component Value   WBC 9.9   HGB 13.6   HCT 40.4   PLT 215       GLUCOSE 103*   ALT 20   AST 24   NA 134*   K 4.2   CL 99   CREATININE 0.97   BUN 25*   CO2 22   Lab Results- [facility] 09/28/2013  Component Value   WBC 10.5   HGB 12.4   HCT 37   PLT 265       GLUCOSE 82   NA 132*   K 5.2   CREATININE 0.7   BUN 19       TSH 15.58*   T3 6.3   T4 47.4  REVIEW OF SYSTEMS  DATA OBTAINED: from patient, medical record GENERAL: Feels well   No fevers, fatigue, change in appetite or weight SKIN: No itch, rash or open wounds EYES: No eye pain, dryness or itching  No change in vision EARS: No earache, tinnitus, change in hearing NOSE: No congestion, drainage or bleeding MOUTH/THROAT: No mouth or tooth pain   No sore throat    No difficulty chewing or swallowing RESPIRATORY: No cough, wheezing, SOB CARDIAC: No chest pain, palpitations  No edema. GI: No abdominal pain  No N/V/D Constipation improved,  No heartburn or reflux  GU: No dysuria, frequency or  urgency  No change in urine volume or character  MUSCULOSKELETAL:  Intermittent Right shoulder pain.  No back pain  No muscle ache, pain, weakness  Gait is steady, ambulating independently   NEUROLOGIC: No dizziness, fainting, headache,No change in mental status (mild dementia).  PSYCHIATRIC: Intermittent  anxiety, agitation - responds well to redirection  Sleeping OK.      PHYSICAL EXAM  Filed Vitals:   11/10/13 1545  BP: 140/62  Pulse: 56  Weight: 144 lb (65.318 kg)   Body mass index is 27.65 kg/(m^2).  GENERAL APPEARANCE: No acute distress, appropriately groomed, normal body habitus. Alert, pleasant, conversant. SKIN: No diaphoresis, rash, unusual lesions, wounds HEAD: Normocephalic, atraumatic EYES: Conjunctiva/lids clear. Pupils round, reactive.  EARS: External exam WNL,. Hearing grossly normal. NOSE: No deformity or discharge. MOUTH/THROAT: Lips w/o lesions. Oral mucosa, tongue moist, w/o lesion. Oropharynx w/o redness or lesions.  NECK: Supple, full ROM. No thyroid tenderness, enlargement or nodule LYMPHATICS: No head, neck or supraclavicular adenopathy RESPIRATORY: Breathing is even, unlabored. Lung sounds are clear and full.  CARDIOVASCULAR: Heart RRR, No murmur or extra heart sounds  ARTERIAL: No carotid bruit. Right radial 2+.  EDEMA: No peripheral or periorbital edema. No ascites GASTROINTESTINAL: Abdomen is soft, non-tender, not distended w/normal bowel sounds.  MUSCULOSKELETAL:  right upper extremity immobilized in a sling, hand grip is strong,remains with some discomfort upper arm.  Moves all other extremities with full ROM, strength and tone. Back is without kyphosis, scoliosis or spinal process tenderness.  NEUROLOGIC: Not oriented to time, is oriented to place, person. Cranial nerves 2-12 grossly intact, speech clear, no tremor.  PSYCHIATRIC: Mood and affect appropriate to situation  ASSESSMENT/PLAN  Unspecified essential hypertension Blood pressure range is  stable, no antihypertensive medication currently.  Unspecified hypothyroidism Estrace and labs confirm hypothyroidism, Synthroid dose was increased. No significant symptomology. Next TSH February 3  Closed fracture of unspecified part of upper end of humerus Right proximal humerus fracture is healing per Dr. Weston Brass does report. He is still recommended nonweightbearing status on his arm, patient continued to wear the sling to remind her not to use the arm. She does continue to have some discomfort. OT continued interventions for passive range of motion exercises.   Memory deficit Patient is very happy to return to her assisted-living apartment. The staff reports short-term memory problems continue to be an issue though patient has been easily redirected. No signs of recurrent paranoia.    Follow up:  Return in about 5 weeks (around 12/15/2013).  11/16/13 TSH  Lizzette Carbonell T.Dlisa Barnwell, NP-C [facility] [phone]  11/10/2013

## 2013-11-16 LAB — TSH: TSH: 17.37 u[IU]/mL — AB (ref 0.41–5.90)

## 2013-11-19 ENCOUNTER — Encounter: Payer: Self-pay | Admitting: Geriatric Medicine

## 2013-11-19 NOTE — Assessment & Plan Note (Signed)
Patient is very happy to return to her assisted-living apartment. The staff reports short-term memory problems continue to be an issue though patient has been easily redirected. No signs of recurrent paranoia.

## 2013-11-19 NOTE — Assessment & Plan Note (Signed)
Estrace and labs confirm hypothyroidism, Synthroid dose was increased. No significant symptomology. Next TSH February 3

## 2013-11-19 NOTE — Assessment & Plan Note (Signed)
Blood pressure range is stable, no antihypertensive medication currently.

## 2013-11-19 NOTE — Assessment & Plan Note (Signed)
Right proximal humerus fracture is healing per Dr. Weston BrassNick does report. He is still recommended nonweightbearing status on his arm, patient continued to wear the sling to remind her not to use the arm. She does continue to have some discomfort. OT continued interventions for passive range of motion exercises.

## 2013-11-29 ENCOUNTER — Ambulatory Visit: Payer: Medicare Other | Admitting: Neurology

## 2013-12-13 ENCOUNTER — Non-Acute Institutional Stay: Payer: Medicare Other | Admitting: Internal Medicine

## 2013-12-13 VITALS — BP 110/64 | HR 60 | Wt 147.0 lb

## 2013-12-13 DIAGNOSIS — R413 Other amnesia: Secondary | ICD-10-CM

## 2013-12-13 DIAGNOSIS — E039 Hypothyroidism, unspecified: Secondary | ICD-10-CM

## 2013-12-13 DIAGNOSIS — I10 Essential (primary) hypertension: Secondary | ICD-10-CM

## 2013-12-13 DIAGNOSIS — F22 Delusional disorders: Secondary | ICD-10-CM
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
## 2013-12-14 LAB — TSH: TSH: 6.17 u[IU]/mL — AB (ref 0.41–5.90)

## 2013-12-20 ENCOUNTER — Ambulatory Visit: Payer: Medicare Other

## 2013-12-20 DIAGNOSIS — M810 Age-related osteoporosis without current pathological fracture: Secondary | ICD-10-CM

## 2013-12-20 MED ORDER — DENOSUMAB 60 MG/ML ~~LOC~~ SOLN
60.0000 mg | Freq: Once | SUBCUTANEOUS | Status: AC
  Administered 2013-12-20: 60 mg via SUBCUTANEOUS

## 2013-12-20 MED ORDER — DENOSUMAB 60 MG/ML ~~LOC~~ SOLN: 60.0000 mg | Freq: Once | SUBCUTANEOUS | Status: DC

## 2013-12-28 ENCOUNTER — Encounter: Payer: Self-pay | Admitting: Internal Medicine

## 2013-12-28 LAB — TSH: TSH: 3.02 u[IU]/mL (ref 0.41–5.90)

## 2014-02-08 ENCOUNTER — Encounter: Payer: Self-pay | Admitting: Internal Medicine

## 2014-02-16 IMAGING — CR DG SHOULDER 2+V*R*
2 series · 2 of 2 positions shown · non-contrast
Comparison: None.

CLINICAL DATA: Shoulder pain status post fall

EXAM:
RIGHT SHOULDER - 2+ VIEW

[x shoulder ap right (1 of 2)]
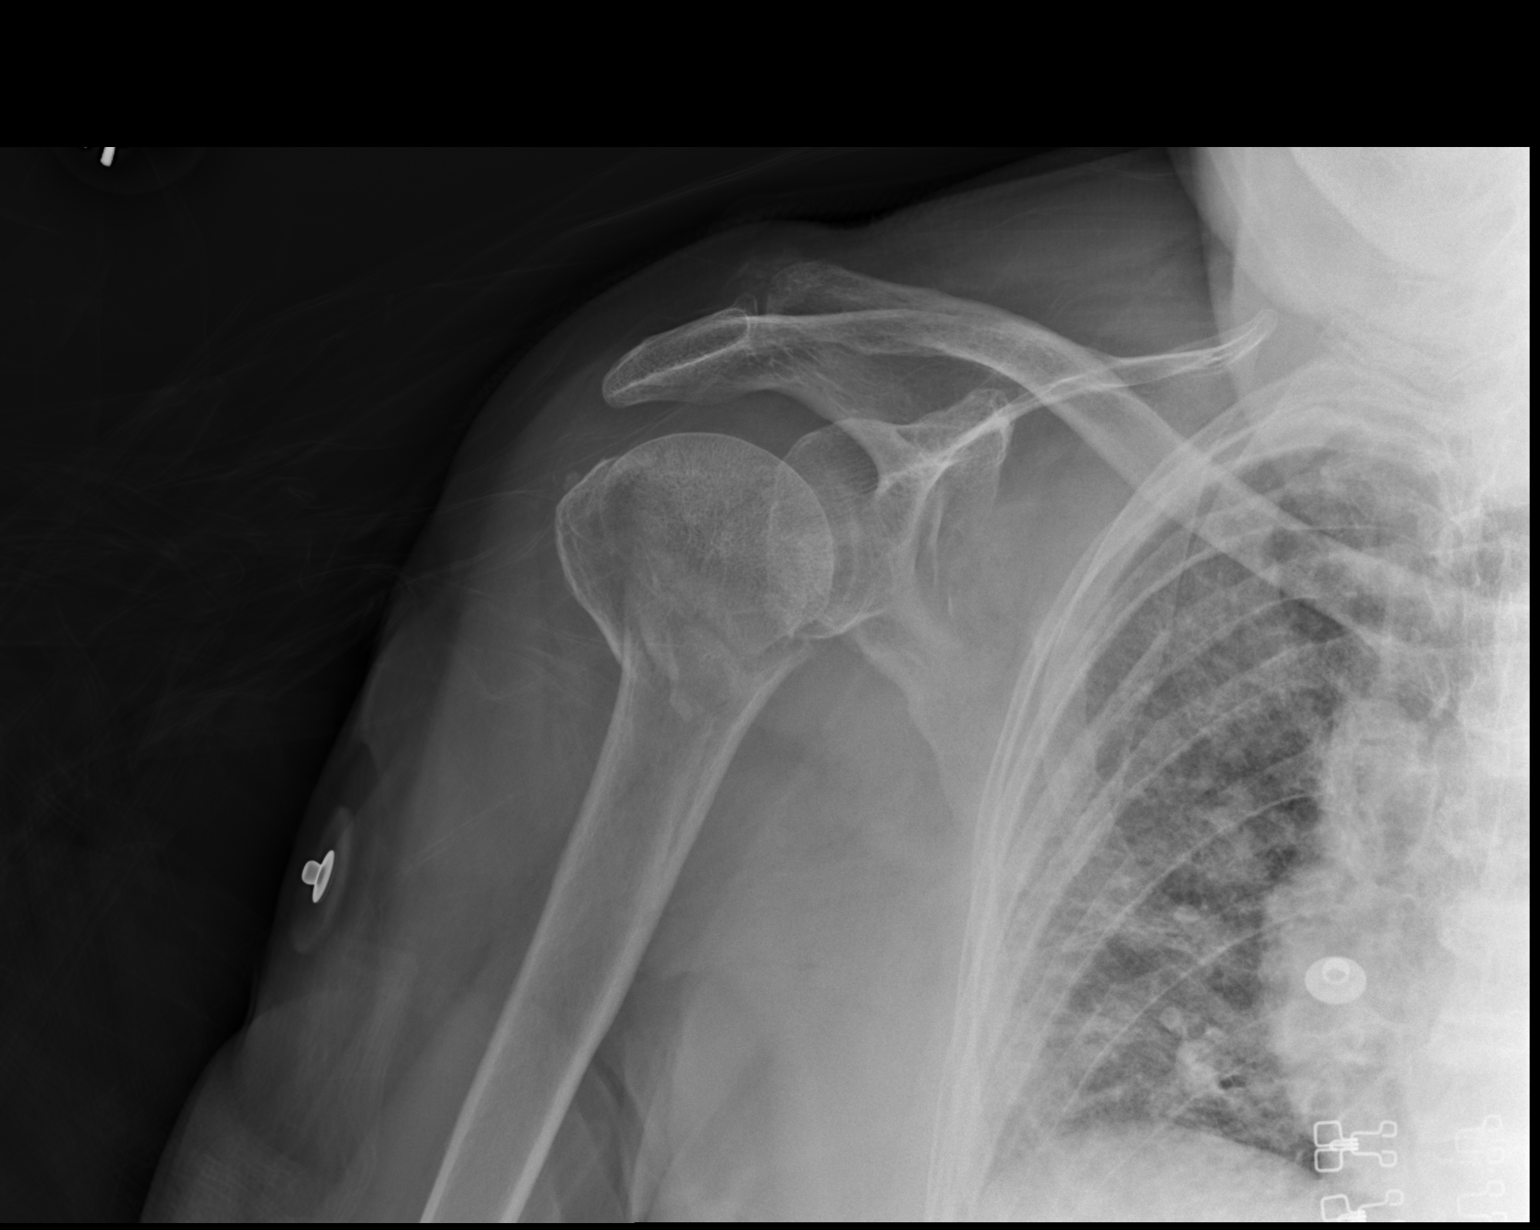

[x shoulder ap right (2 of 2)]
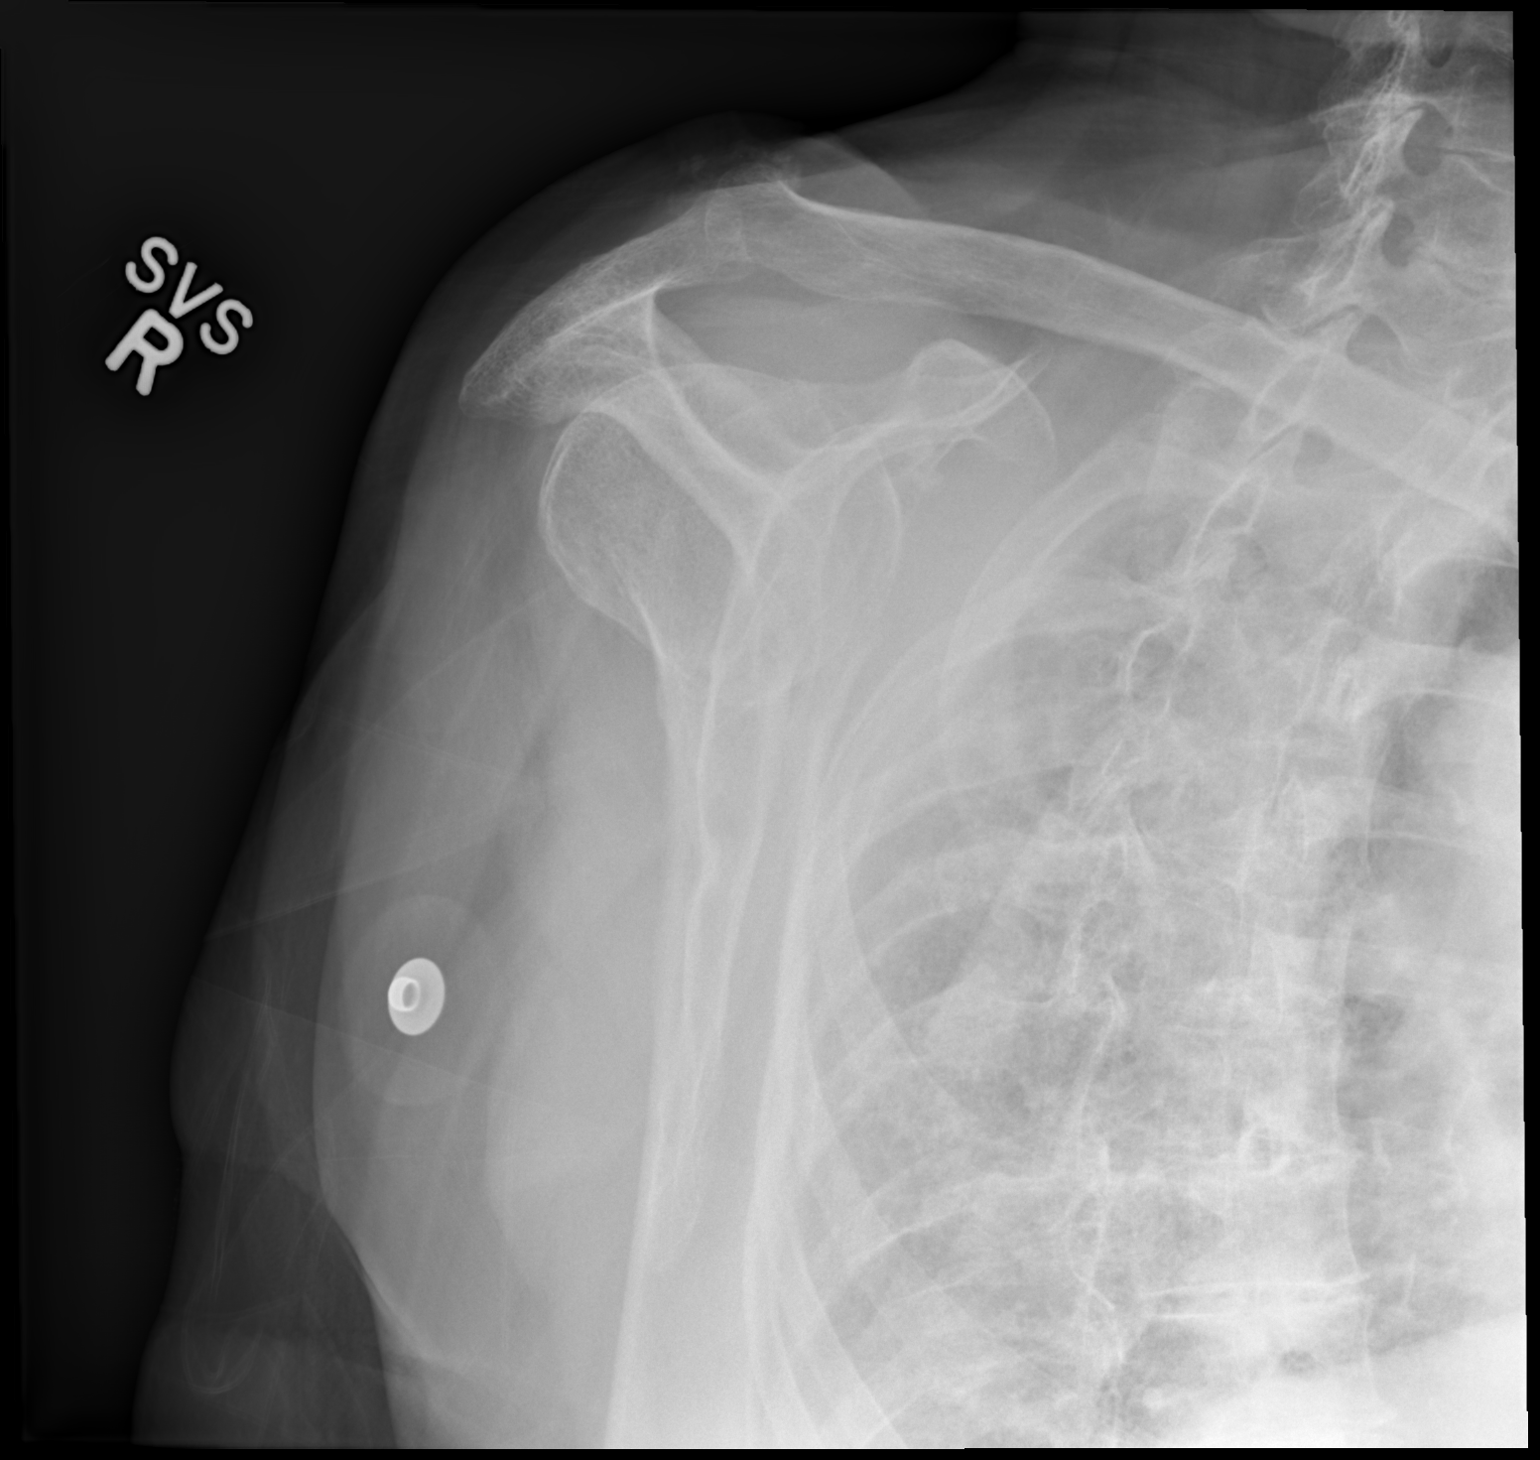

[2 of 2 positions shown; findings below may reference images not displayed]

FINDINGS: A comminuted impacted humeral head fracture with intra-articular
extension and lateral angulation is appreciated. Degenerative
changes appreciated on acromioclavicular joint.
IMPRESSION: Humeral head fracture.

## 2014-03-09 ENCOUNTER — Encounter: Payer: Self-pay | Admitting: Geriatric Medicine

## 2014-03-09 ENCOUNTER — Non-Acute Institutional Stay: Payer: Medicare Other | Admitting: Geriatric Medicine

## 2014-03-09 VITALS — BP 142/72 | HR 60 | Wt 151.0 lb

## 2014-03-09 DIAGNOSIS — F22 Delusional disorders: Secondary | ICD-10-CM

## 2014-03-09 MED ORDER — QUETIAPINE FUMARATE 50 MG PO TABS: 50.0000 mg | ORAL_TABLET | ORAL | Status: DC

## 2014-03-09 NOTE — Assessment & Plan Note (Addendum)
Patient has been taking Seroquel since August 2014 for treatment of delusion/ paranoia.Symptoms were quite severe at that time. Patient is followed at intervals by Dr.Plovsky. Dose reduction was attempted in March 2015 that was unsuccessful. Her daughter is expressing significant concern regarding the side effects of Seroquel particularly increased risk for falls and the black box warning of increased death among dementia patients who take Seroquel. Daughter also notes patient's general demeanor is "dull" as compared to her previously, she sometimes sleeps during the day.  The patient has been generally doing very well in assisted living setting. No recent signs of paranoia or delusions. She does continue to have significant short-term memory loss. Will attempt dose reduction again, nursing staff will monitor for any behavior changes. Will also check laboratory studies. Dr. Donell Beers is due to see this patient again in June 2015.

## 2014-03-09 NOTE — Progress Notes (Signed)
Patient ID: [name], female   DOB: November 02, 1926, 80 y.o.   MRN: [id number]   [facility] [phone](12)  Code Status:  Contact Information   Name Relation Home Work [place]   [name],[name]  [phone]  [phone]       Chief Complaint  Patient presents with  . Medical Management of Chronic Issues    per daughter [name],  request wants to discuss medication and effects of Seroquel.     HPI: This is a 80 y.o. female resident of [facility]isted [facility] section.  Evaluation is requested today for medication review.   Patient's daughter has concerns regarding side effects ofSeroquel. She is particularly concerned about risk of fall, the black box warning regarding increased incidence of death recurrent lung dementia patients who take Seroquel. She also expresses concern about the patient's lack of activity and "dull" affect. Review of facility record shows no recent documentation regarding mood or behavior; no report of paranoia or delusions. Vital signs have been stable, patient has had a 8 pound weight gain since January.    Allergies  Allergen Reactions  . Codeine   . Darvocet [Propoxyphene N-Acetaminophen]   . Penicillins   . Percocet [Oxycodone-Acetaminophen]   . Sulfa Antibiotics   . Sulfites   . Tobramycin-Dexamethasone     MEDICATIONS -  Reviewed  DATA REVIEWED  Radiologic Exams  Cardiovascular Exams  Laboratory Studies Lab Results  Component Value Date   WBC 10.5 09/28/2013   HGB 12.4 09/28/2013   HCT 37 09/28/2013   MCV 86.5 09/24/2013   PLT 265 09/28/2013   Lab Results  Component Value Date   NA 132* 09/28/2013   K 5.2 09/28/2013   GLU 82 09/28/2013   BUN 19 09/28/2013   CREATININE 0.7 09/28/2013   Lab Results  Component Value Date   CALCIUM 9.1 09/24/2013   ALBUMIN 3.5 09/24/2013   AST 24 09/24/2013   ALT 20 09/24/2013   ALKPHOS 81 09/24/2013   BILITOT 0.3 09/24/2013   GFRNONAA 52*  09/24/2013   GFRAA 60* 09/24/2013   Lab Results  Component Value Date   TSH 3.02 12/28/2013   TSH 6.17* 12/14/2013   TSH 17.37* 11/16/2013     REVIEW OF SYSTEMS  DATA OBTAINED: from patient, nurse, medical record, caregiver, family member GENERAL: Feels well "I'm  healthy as a horse" No recent fever, fatigue, change in activity status, appetite, or weight  RESPIRATORY: No cough, wheezing, SOB CARDIAC: No chest pain, palpitations. No edema GI: No abdominal pain  No Nausea,vomiting,diarrhea or constipation  No heartburn or reflux  MUSCULOSKELETAL: OT interventions continue with rt. Shoulder     No joint pain, swelling or stiffness   No back pain    No muscle ache, pain, weakness    Gait is steady    Last fall 01/2014  NEUROLOGIC: No dizziness, fainting, headache,  No change in mental status (STML) PSYCHIATRIC: No feelings of anxiety, depression  Sleeps well   No behavior issue   PHYSICAL EXAM Filed Vitals:   03/09/14 0903  BP: 142/72  Pulse: 60  Weight: 151 lb (68.493 kg)   Body mass index is 28.99 kg/(m^2).  GENERAL APPEARANCE: No acute distress, appropriately groomed, normal body habitus. Alert, pleasant, conversant. SKIN: No diaphoresis, rash, unusual lesions, wounds HEAD: Normocephalic, atraumatic EYES: Conjunctiva/lids clear.  EARS:  Hearing deficit present. NOSE: No deformity or discharge. MOUTH/THROAT: Lips w/o lesions. Oral mucosa, tongue moist, w/o lesion. Oropharynx w/o redness or lesions.  NECK: Supple,  full ROM. No thyroid tenderness, enlargement or nodule LYMPHATICS: No head, neck or supraclavicular adenopathy RESPIRATORY: Breathing is even, unlabored. Lung sounds are clear and full.  CARDIOVASCULAR: Heart RRR. No murmur or extra heart sounds  Standing BP 130/68 MUSCULOSKELETAL: Moves all extremities with full ROM, strength and tone. Back is without kyphosis, scoliosis or spinal process tenderness. Gait is steady NEUROLOGIC: Oriented to time, place, person. Speech  clear, no tremor.  STML evident PSYCHIATRIC: Mood and affect appropriate to situation   ASSESSMENT/PLAN  Delusion Patient has been taking Seroquel since August 2014 for treatment of delusion/ paranoia.Symptoms were quite severe at that time. Patient is followed at intervals by Dr.Plovsky. Dose reduction was attempted in March 2015 that was unsuccessful. Her daughter is expressing significant concern regarding the side effects of Seroquel particularly increased risk for falls and the black box warning of increased death among dementia patients who take Seroquel. Daughter also notes patient's general demeanor is "dull" as compared to her previously, she sometimes sleeps during the day.  The patient has been generally doing very well in assisted living setting. No recent signs of paranoia or delusions. She does continue to have significant short-term memory loss. Will attempt dose reduction again, nursing staff will monitor for any behavior changes. Will also check laboratory studies. Dr. Donell Beers is due to see this patient again in June 2015.    Family/ staff Communication:  Daughter present for the visit today, discussed planned for dose reduction, she is aware Dr. Donell Beers will be making a visit next week  Time: 40 minutes, >50% spent counseling/or care coordination  Labs/tests ordered: 5/28: CBC, CMP, TSH   Follow up: Return for As scheduled.  Toribio Harbour, NP-C [facility] [phone]  03/09/2014

## 2014-03-10 LAB — CBC AND DIFFERENTIAL
HCT: 43 % (ref 36–46)
Hemoglobin: 14.1 g/dL (ref 12.0–16.0)
Platelets: 316 10*3/uL (ref 150–399)
WBC: 6.8 10^3/mL

## 2014-03-10 LAB — HEPATIC FUNCTION PANEL
ALT: 13 U/L (ref 7–35)
AST: 18 U/L (ref 13–35)
Alkaline Phosphatase: 67 U/L (ref 25–125)
Bilirubin, Total: 0.6 mg/dL

## 2014-03-10 LAB — BASIC METABOLIC PANEL
BUN: 23 mg/dL — AB (ref 4–21)
Creatinine: 0.7 mg/dL (ref 0.5–1.1)
GLUCOSE: 78 mg/dL
Potassium: 4.5 mmol/L (ref 3.4–5.3)
SODIUM: 140 mmol/L (ref 137–147)

## 2014-03-10 LAB — TSH: TSH: 0.91 u[IU]/mL (ref 0.41–5.90)

## 2014-04-25 ENCOUNTER — Encounter: Payer: Self-pay | Admitting: Neurology

## 2014-04-25 ENCOUNTER — Ambulatory Visit (INDEPENDENT_AMBULATORY_CARE_PROVIDER_SITE_OTHER): Payer: Medicare Other | Admitting: Neurology

## 2014-04-25 ENCOUNTER — Encounter (INDEPENDENT_AMBULATORY_CARE_PROVIDER_SITE_OTHER): Payer: Self-pay

## 2014-04-25 VITALS — BP 150/76 | HR 61 | Temp 97.6°F | Ht 60.5 in | Wt 154.0 lb

## 2014-04-25 DIAGNOSIS — F03918 Unspecified dementia, unspecified severity, with other behavioral disturbance: Secondary | ICD-10-CM

## 2014-04-25 DIAGNOSIS — M810 Age-related osteoporosis without current pathological fracture: Secondary | ICD-10-CM

## 2014-04-25 DIAGNOSIS — F0391 Unspecified dementia with behavioral disturbance: Secondary | ICD-10-CM

## 2014-04-25 DIAGNOSIS — F22 Delusional disorders: Secondary | ICD-10-CM

## 2014-04-25 MED ORDER — DONEPEZIL HCL 5 MG PO TBDP: 5.0000 mg | ORAL_TABLET | Freq: Every day | ORAL | Status: DC

## 2014-04-25 NOTE — Progress Notes (Signed)
Subjective:    Patient ID: [name] is a 80 y.o. female.  HPI    Interim history:   [name] is a very pleasant 80 year old right-handed woman with an underlying medical history of hypertension, hypothyroidism, osteoporosis with numerous back surgeries for compression fractures and status post 2 knee replacement surgeries, who presents for followup consultation of her memory loss. She is accompanied by her daughter today. I first met her on 07/27/2013, at which time she reported memory loss for the past years, worse in the last 6 months, when she started having trouble with her finances. At the time of her first visit with me I suggested further workup in the form of blood work which included ESR, RPR, hemoglobin A1c, and brain MRI without contrast. I suggested pursuing formal memory testing in the future. I started her on a trial of Aricept 5 mg strength and suggested that she reduce Seroquel to 25 mg each night. Her brain MRI without contrast on 08/03/2013 showed: Abnormal MRI brain (without) demonstrating: 1. Mild diffuse atrophy. 2. Mild periventricular and subcortical foci of non-specific gliosis. 3. No acute findings. I personally reviewed the images through the PACS system. We called her daughter with the test results. In the interim, on 09/24/2013 she fell and injured her right shoulder. Right shoulder x-ray on 09/24/2013 showed a humeral head fracture. Treatment was conservative and she was in SNF for about 2 months. She has PT at [facility] currently and after that will have a Physiological scientist.  Therefore, she missed an appointment with me in 12/14. She is off of Aricept since 12/14.   Today, her daughter states, that the felt, her mother had been tolerating the Aricept at the time. The patient reports no new complaints. Her daughter reports another fall.   She did not have to deal with the financial aspects of her life until her husband died in 2004-12-15. She moved into  Independent living in 12-15-06. In August, she started having delusions about somebody stealing from her. She has a safe in her closet and while most of the reported missing items have been found in various hidden places in her apartment, one box of rings has not yet been found. She has always been somewhat of a "hider". She states, that "they were there and they went through my safe and they left". She states, she was asleep and the "girls came and went through my stuff. They were taking care of another lady and decided to come to my apartment to see what they could find". She has been on Seroquel at low dose at night which has helped her agitation but has not changed her solution. She is currently on Synthroid, multivitamin, and Seroquel 50 mg each bedtime. She resides at [facility] in the [place] where she moved to in 8/14 from the [place] area d/t agitation.  There is no FHx of AD or dementia, her brother died at age 39 with mesothelioma. She now is the only surviving sibling of 5.  She has no prior Hx of psychiatric illness.  She had been on an antidepressant, namely Zoloft.  The patient denies prior TIA or stroke symptoms, such as sudden onset of one sided weakness, numbness, tingling, slurring of speech or droopy face, hearing loss, tinnitus, diplopia or visual field cut or monocular loss of vision, and denies recurrent headaches.  Of note, the patient does not snore, and there is no report of witnessed apneas or choking sensations while asleep. There  is no associated EDS. She denies morning HAs. She has not been driving since 4/68.   Her Past Medical History Is Significant For: Past Medical History  Diagnosis Date  . Unspecified hypothyroidism 06/05/2005  . Unspecified vitamin D deficiency 02/03/2012  . Other malaise and fatigue 10/09/2009  . Other and unspecified hyperlipidemia 07/10/2009  . Backache, unspecified 04/10/2009  . Unspecified hearing loss 12/05/2008  . Herpes  zoster with other nervous system complications(053.19) 01/02/2247  . Unspecified essential hypertension 11/02/2007  . Rash and other nonspecific skin eruption 11/02/2007  . Shortness of breath 11/02/2007  . Senile osteoporosis 07/09/2006  . Cervicalgia 09/12/2005  . Memory deficit   . Unspecified constipation   . Delusion 06/04/2013  . Closed fracture of unspecified part of upper end of humerus 09/28/2013    Right proximal humerus fracture 09/24/2013    Her Past Surgical History Is Significant For: Past Surgical History  Procedure Laterality Date  . Back surgery  2006    Dr. Wynelle Link  . Total knee arthroplasty Left 09/15/2002  . Laminectomy  03/20/2005    Centtal with Bil L3 & L4 nerve root decompression  Dr. Louanne Skye  . Total knee arthroplasty  2000    Dr. Rhona Raider  . Spine surgery  1991    Dr. Louanne Skye  . Spine surgery  1996    Dr. Kirstie Mirza  . Kidney stone surgery  1959    Dr. Leory Plowman  . Cataract extraction, bilateral      Her Family History Is Significant For: Family History  Problem Relation Age of Onset  . Heart disease Father     Her Social History Is Significant For: History   Social History  . Marital Status: Widowed    Spouse Name: N/A    Number of Children: 3  . Years of Education: college   Occupational History  . retired    Social History Main Topics  . Smoking status: Former Smoker    Quit date: 02/22/1989  . Smokeless tobacco: Never Used  . Alcohol Use: No  . Drug Use: No  . Sexual Activity: No   Other Topics Concern  . None   Social History Narrative   Patient is Widowed, homemaker.  [facility], and previously lived in independent living apartment in same community since 2007    Stopped smoking in 1990s, no significant alcohol history    Patient has  Advanced planning documents: Living Will          Her Allergies Are:  Allergies  Allergen Reactions  . Codeine   . Darvocet [Propoxyphene  N-Acetaminophen]   . Penicillins   . Percocet [Oxycodone-Acetaminophen]   . Sulfa Antibiotics   . Sulfites   . Tobramycin-Dexamethasone   :   Her Current Medications Are:  Outpatient Encounter Prescriptions as of 04/25/2014  Medication Sig  . acetaminophen (TYLENOL) 325 MG tablet Take 650 mg by mouth 3 (three) times daily.   Marland Kitchen denosumab (PROLIA) 60 MG/ML SOLN injection Inject 60 mg into the skin once. Administer in upper arm, thigh, or abdomen  . desvenlafaxine (PRISTIQ) 50 MG 24 hr tablet Take 50 mg by mouth. Take one tablet daily for depression  . Dimethicone (AVEENO DAILY MOISTURIZING) 1.25 % LOTN Apply topically. Apply to left forearm twice daily  . ibuprofen (ADVIL,MOTRIN) 400 MG tablet Take 400 mg by mouth. Take one tablet 2-3 times daily as needed for pain  . levothyroxine (SYNTHROID, LEVOTHROID) 100 MCG tablet Take 100 mcg by  mouth daily before breakfast.  . Multiple Vitamins-Minerals (CENTRUM SILVER PO) Take one tablet once daily  . polyethylene glycol powder (GLYCOLAX/MIRALAX) powder Take 17 g by mouth daily.  . QUEtiapine (SEROQUEL) 50 MG tablet Take 1 tablet (50 mg total) by mouth as directed. Take one half tablet (25 mg) in a.m., take one tablet at bedtime to treat delusion/paranoia  :  Review of Systems:  Out of a complete 14 point review of systems, all are reviewed and negative with the exception of these symptoms as listed below:   Review of Systems  Constitutional: Negative.   HENT: Negative.   Eyes: Negative.   Respiratory: Negative.   Cardiovascular: Negative.   Gastrointestinal: Negative.   Endocrine: Negative.   Genitourinary: Negative.   Musculoskeletal: Negative.   Skin: Negative.   Allergic/Immunologic: Negative.   Neurological: Negative.   Hematological: Negative.   Psychiatric/Behavioral: Negative.     Objective:  Neurologic Exam  Physical Exam Physical Examination:   Filed Vitals:   04/25/14 1440  BP: 150/76  Pulse: 61  Temp: 97.6 F  (36.4 C)    General Examination: The patient is a very pleasant 80 y.o. female in no acute distress. She is calm and cooperative with the exam. She denies Auditory Hallucinations, Visual Hallucinations and Tactile Hallucinations. She is well groomed and situated in a chair.   HEENT: Normocephalic, atraumatic, pupils are equal, round and reactive to light and accommodation. Funduscopic exam is normal with sharp disc margins noted. Extraocular tracking shows mild saccadic breakdown without nystagmus noted. Hearing is intact. Face is symmetric with no facial masking and normal facial sensation. There is no lip, neck or jaw tremor. Neck is not rigid with intact passive ROM. There are no carotid bruits on auscultation. Oropharynx exam reveals moderate mouth dryness. No significant airway crowding is noted. Mallampati is class II. Tongue protrudes centrally and palate elevates symmetrically.    Chest: is clear to auscultation without wheezing, rhonchi or crackles noted, with the exception of mild end-expiratory R basilar crackles.  Heart: sounds are regular and normal without murmurs, rubs or gallops noted.   Abdomen: is soft, non-tender and non-distended with normal bowel sounds appreciated on auscultation.  Extremities: There is no pitting edema in the distal lower extremities bilaterally. Pedal pulses are intact.   Skin: is warm and dry with no trophic changes noted. Age-related changes are noted on the skin.   Musculoskeletal: exam reveals no obvious joint deformities, tenderness or joint swelling or erythema.    Neurologically:  Mental status: The patient is awake and alert, paying fair attention. She is unable to completely provide the history. Her daughter provides most of the Hx. She is oriented to: person, situation and day of week. Her memory, attention, language and knowledge are impaired. There is no aphasia, agnosia, apraxia or anomia. There is a mild degree of bradyphrenia. Speech is not  hypophonic with no dysarthria noted. Mood is congruent and affect is blunted.  On 07/27/13: Her MMSE (Mini-Mental state exam) score is 20/30, CDT (Clock Drawing Test) score is 2/4, AFT (Animal Fluency Test) score is 3.  04/25/2014: MMSE is 13/30, clock drawing 0/4, AFT is 4/min.  Cranial nerves are as described above under HEENT exam. In addition, shoulder shrug is normal with equal shoulder height noted.  Motor exam: Normal bulk, and strength for age is noted. Tone is not rigid with absence of cogwheeling in the extremities. There is overall no bradykinesia. There is no drift or rebound. There is no tremor.  Romberg is negative. Reflexes are 1+ in the upper extremities and 2+ in both knees and absent in both ankles. Fine motor skills: Finger taps, hand movements, and rapid alternating patting are not impaired bilaterally. Foot taps and foot agility are not impaired bilaterally.   Cerebellar testing shows no dysmetria or intention tremor. There is no truncal or gait ataxia.   Sensory exam is intact to light touch, pinprick, vibration, temperature sense in the upper and lower extremities.   Gait, station and balance: She stands up from the seated position with no difficulty and needs no help. No veering to one side is noted. No leaning to one side. Posture is age-appropriate. Stance is narrow-based. She turns en bloc. Tandem walk is possible. Balance is mildly impaired.   Assessment and Plan:   In summary, [name] is a very pleasant 80 year old female with an underlying medical history of hypertension, hypothyroidism, osteoporosis with numerous back surgeries for compression Fx and s/p 2 knee replacement surgeries, as well as recent fall with R humerus fracture, who has a history of memory loss of the past few years with behavioral changes. She has been off of Aricept for about 6 months, d/t unclear reasons. I will restart it at the 5 mg strength. We talked about her recent test results,  including her fall and her MRI brain results. There is decline in her memory numbers today as compared to October 2014. Rarely, Aricept and related medications can cause bradycardia and at times syncopal events. However, we do not know the mechanism of her fall in December. I think we should cautiously restart her on Aricept and watch her. I would like for her to come back in 3 months and see our nurse practitioner at the time. At that visit we can discuss increasing her Aricept to 10 mg if she is able to tolerate it. I will see her back in 6 months. The patient and her daughter were in agreement. I did encourage her to change positions slowly and always get her bearings before she starts walking. I've asked her to drink more water. I answered all their questions today and the patient and her daughter were in agreement with the above outlined plan.

## 2014-04-25 NOTE — Patient Instructions (Signed)
Let's restart you on the Aricept 5 mg for your memory.  Please drink more water.  Please change positions slowly.  Follow up in 3 months with Heide GuileLynn Lam, NP, at which time we can consider going up to the 10 mg strength.

## 2014-06-08 ENCOUNTER — Encounter: Payer: Medicare PPO | Admitting: Nurse Practitioner

## 2014-06-27 ENCOUNTER — Ambulatory Visit: Payer: Medicare Other

## 2014-06-29 ENCOUNTER — Ambulatory Visit: Payer: Medicare Other

## 2014-07-06 ENCOUNTER — Ambulatory Visit: Payer: Medicare Other

## 2014-07-06 DIAGNOSIS — M810 Age-related osteoporosis without current pathological fracture: Secondary | ICD-10-CM

## 2014-07-06 MED ORDER — DENOSUMAB 60 MG/ML ~~LOC~~ SOLN
60.0000 mg | Freq: Once | SUBCUTANEOUS | Status: AC
  Administered 2014-07-06: 60 mg via SUBCUTANEOUS

## 2014-07-25 ENCOUNTER — Non-Acute Institutional Stay: Payer: Medicare Other | Admitting: Internal Medicine

## 2014-07-25 VITALS — BP 136/70 | HR 66 | Temp 97.5°F | Resp 18 | Wt 158.8 lb

## 2014-07-25 DIAGNOSIS — R413 Other amnesia: Secondary | ICD-10-CM

## 2014-07-25 DIAGNOSIS — E038 Other specified hypothyroidism: Secondary | ICD-10-CM

## 2014-07-25 DIAGNOSIS — I10 Essential (primary) hypertension: Secondary | ICD-10-CM

## 2014-07-25 DIAGNOSIS — F03918 Unspecified dementia, unspecified severity, with other behavioral disturbance: Secondary | ICD-10-CM

## 2014-07-25 DIAGNOSIS — F22 Delusional disorders: Secondary | ICD-10-CM

## 2014-07-25 DIAGNOSIS — F0391 Unspecified dementia with behavioral disturbance: Secondary | ICD-10-CM

## 2014-07-26 ENCOUNTER — Ambulatory Visit (INDEPENDENT_AMBULATORY_CARE_PROVIDER_SITE_OTHER): Payer: Medicare Other | Admitting: Nurse Practitioner

## 2014-07-26 ENCOUNTER — Encounter: Payer: Self-pay | Admitting: Nurse Practitioner

## 2014-07-26 ENCOUNTER — Encounter (INDEPENDENT_AMBULATORY_CARE_PROVIDER_SITE_OTHER): Payer: Self-pay

## 2014-07-26 VITALS — BP 141/72 | HR 55 | Temp 97.2°F | Ht 61.0 in | Wt 157.0 lb

## 2014-07-26 DIAGNOSIS — F03918 Unspecified dementia, unspecified severity, with other behavioral disturbance: Secondary | ICD-10-CM

## 2014-07-26 DIAGNOSIS — F0391 Unspecified dementia with behavioral disturbance: Secondary | ICD-10-CM

## 2014-07-26 LAB — TSH: TSH: 0.97 u[IU]/mL (ref 0.41–5.90)

## 2014-07-26 MED ORDER — DONEPEZIL HCL 10 MG PO TBDP: 10.0000 mg | ORAL_TABLET | Freq: Every day | ORAL | Status: DC

## 2014-07-26 NOTE — Progress Notes (Addendum)
PATIENT: [name] DOB: 01-Jun-1927   REASON FOR VISIT: routine follow up for Dementia w Behavioral disturbance HISTORY FROM: patient  HISTORY OF PRESENT ILLNESS: [name] is a very pleasant 80 year old right-handed woman with an underlying medical history of hypertension, hypothyroidism, osteoporosis with numerous back surgeries for compression fractures and status post 2 knee replacement surgeries, who presents for followup consultation of her memory loss.   Update 07/26/14 (LL): [name] returns to the office for 3 month followup accompanied by her daughter. As far as they know she has had no difficulty tolerating Aricept 5 mg at bedtime. Her geriatric depression score today is 0. Her MMSE score today is 15/30 which is a 2 point improvement since last visit 3 months ago. She denies any visual or auditory hallucinations. Her daughter states that no one has said anything more about delusions. She is no longer taking Seroquel in the morning, only at bedtime. She has no complaints today.  Prior Visit, 04/25/14: She is accompanied by her daughter today. I first met her on 07/27/2013, at which time she reported memory loss for the past years, worse in the last 6 months, when she started having trouble with her finances. At the time of her first visit with me I suggested further workup in the form of blood work which included ESR, RPR, hemoglobin A1c, and brain MRI without contrast. I suggested pursuing formal memory testing in the future. I started her on a trial of Aricept 5 mg strength and suggested that she reduce Seroquel to 25 mg each night. Her brain MRI without contrast on 08/03/2013 showed: Abnormal MRI brain (without) demonstrating: 1. Mild diffuse atrophy. 2. Mild periventricular and subcortical foci of non-specific gliosis. 3. No acute findings. I personally reviewed the images through the PACS system. We called her daughter with the test results. In the interim, on 09/24/2013  she fell and injured her right shoulder. Right shoulder x-ray on 09/24/2013 showed a humeral head fracture. Treatment was conservative and she was in SNF for about 2 months. She has PT at [facility] currently and after that will have a Physiological scientist.  Therefore, she missed an appointment with me in 12/14. She is off of Aricept since 12/14.  Today, her daughter states, that the felt, her mother had been tolerating the Aricept at the time. The patient reports no new complaints. Her daughter reports another fall.  She did not have to deal with the financial aspects of her life until her husband died in 01-04-2005. She moved into Independent living in 04-Jan-2007. In August, she started having delusions about somebody stealing from her. She has a safe in her closet and while most of the reported missing items have been found in various hidden places in her apartment, one box of rings has not yet been found. She has always been somewhat of a "hider". She states, that "they were there and they went through my safe and they left". She states, she was asleep and the "girls came and went through my stuff. They were taking care of another lady and decided to come to my apartment to see what they could find". She has been on Seroquel at low dose at night which has helped her agitation but has not changed her solution. She is currently on Synthroid, multivitamin, and Seroquel 50 mg each bedtime. She resides at [facility] in the [place] where she moved to in 8/14 from the [place] area d/t agitation.  There is no  FHx of AD or dementia, her brother died at age 31 with mesothelioma. She now is the only surviving sibling of 5.  She has no prior Hx of psychiatric illness.  She had been on an antidepressant, namely Zoloft.  The patient denies prior TIA or stroke symptoms, such as sudden onset of one sided weakness, numbness, tingling, slurring of speech or droopy face, hearing loss, tinnitus, diplopia or  visual field cut or monocular loss of vision, and denies recurrent headaches.  Of note, the patient does not snore, and there is no report of witnessed apneas or choking sensations while asleep. There is no associated EDS. She denies morning HAs. She has not been driving since 7/79.   REVIEW OF SYSTEMS: Full 14 system review of systems performed and notable only for: Hearing loss   ALLERGIES: Allergies  Allergen Reactions  . Codeine   . Darvocet [Propoxyphene N-Acetaminophen]   . Penicillins   . Percocet [Oxycodone-Acetaminophen]   . Sulfa Antibiotics   . Sulfites   . Tobramycin-Dexamethasone     HOME MEDICATIONS: Outpatient Prescriptions Prior to Visit  Medication Sig Dispense Refill  . acetaminophen (TYLENOL) 325 MG tablet Take 650 mg by mouth 3 (three) times daily.       Marland Kitchen denosumab (PROLIA) 60 MG/ML SOLN injection Inject 60 mg into the skin once. Administer in upper arm, thigh, or abdomen  1 mL  0  . desvenlafaxine (PRISTIQ) 50 MG 24 hr tablet Take 50 mg by mouth. Take one tablet daily for depression      . Dimethicone (AVEENO DAILY MOISTURIZING) 1.25 % LOTN Apply topically. Apply to left forearm twice daily      . ibuprofen (ADVIL,MOTRIN) 400 MG tablet Take 400 mg by mouth. Take one tablet 2-3 times daily as needed for pain      . levothyroxine (SYNTHROID, LEVOTHROID) 100 MCG tablet Take 100 mcg by mouth daily before breakfast.      . Multiple Vitamins-Minerals (CENTRUM SILVER PO) Take one tablet once daily      . polyethylene glycol powder (GLYCOLAX/MIRALAX) powder Take 17 g by mouth daily.  3350 g  1  . donepezil (ARICEPT ODT) 5 MG disintegrating tablet Take 1 tablet (5 mg total) by mouth at bedtime.  30 tablet  5  . QUEtiapine (SEROQUEL) 50 MG tablet Take 1 tablet (50 mg total) by mouth as directed. Take one half tablet (25 mg) in a.m., take one tablet at bedtime to treat delusion/paranoia       No facility-administered medications prior to visit.    PHYSICAL EXAM Filed  Vitals:   07/26/14 1432  BP: 141/72  Pulse: 55  Temp: 97.2 F (36.2 C)  TempSrc: Oral  Height: 5' 1"  (1.549 m)  Weight: 157 lb (71.215 kg)   Body mass index is 29.68 kg/(m^2). No exam data present No flowsheet data found.  MMSE - Mini Mental State Exam 07/26/2014 04/25/2014 06/16/2013  Orientation to time 1 0 1  Orientation to Place 2 2 4   Registration 3 3 3   Attention/ Calculation 0 0 0  Recall 0 0 1  Language- name 2 objects 2 2 2   Language- repeat 1 1 1   Language- follow 3 step command 3 3 3   Language- read & follow direction 1 1 1   Write a sentence 1 1 1   Copy design 1 0 1  Total score 15 13 18     General Examination: The patient is a very pleasant 80 y.o. female in no acute distress. She is  calm and cooperative with the exam. She denies Auditory Hallucinations, Visual Hallucinations and Tactile Hallucinations. She is well groomed and situated in a chair.  HEENT: Normocephalic, atraumatic, pupils are equal, round and reactive to light and accommodation. Funduscopic exam is normal with sharp disc margins noted. Extraocular tracking shows mild saccadic breakdown without nystagmus noted. Hearing is intact. Face is symmetric with no facial masking and normal facial sensation. There is no lip, neck or jaw tremor. Neck is not rigid with intact passive ROM. There are no carotid bruits on auscultation. Oropharynx exam reveals moderate mouth dryness. No significant airway crowding is noted. Mallampati is class II. Tongue protrudes centrally and palate elevates symmetrically.  Chest: is clear to auscultation without wheezing, rhonchi or crackles noted, with the exception of mild end-expiratory R basilar crackles.  Heart: sounds are regular and normal without murmurs, rubs or gallops noted.   Neurologically:  Mental status: The patient is awake and alert, paying fair attention. She is unable to completely provide the history. Her daughter provides most of the Hx. She is oriented to: person,  situation and day of week. Her memory, attention, language and knowledge are impaired. There is no aphasia, agnosia, apraxia or anomia. There is a mild degree of bradyphrenia. Speech is not hypophonic with no dysarthria noted. Mood is congruent and affect is blunted.  On 07/27/13: Her MMSE (Mini-Mental state exam) score is 20/30, CDT (Clock Drawing Test) score is 2/4, AFT (Animal Fluency Test) score is 3.  04/25/2014: MMSE is 13/30, clock drawing 0/4, AFT is 4/min.  07/26/14 :  MMSE is 15/30, clock drawing 3/4, AFT is 1/min.  Cranial nerves are as described above under HEENT exam. In addition, shoulder shrug is normal with equal shoulder height noted.  Motor exam: Normal bulk, and strength for age is noted. Tone is not rigid with absence of cogwheeling in the extremities. There is overall no bradykinesia. There is no drift or rebound. There is no tremor.  Romberg is negative. Reflexes are 1+ in the upper extremities and 2+ in both knees and absent in both ankles.  Fine motor skills: Finger taps, hand movements, and rapid alternating patting are not impaired bilaterally. Foot taps and foot agility are not impaired bilaterally.  Cerebellar testing shows no dysmetria or intention tremor. There is no truncal or gait ataxia.  Sensory exam is intact to light touch, pinprick, vibration, temperature sense in the upper and lower extremities.  Gait, station and balance: She stands up from the seated position with no difficulty and needs no help. No veering to one side is noted. No leaning to one side. Posture is age-appropriate. Stance is narrow-based. She turns en bloc. Tandem walk is possible. Balance is mildly impaired.    ASSESSMENT: In summary, ZHARIA CONROW is a very pleasant 80 year old female with an underlying medical history of hypertension, hypothyroidism, osteoporosis with numerous back surgeries for compression Fx and s/p 2 knee replacement surgeries, as well as recent fall with R humerus  fracture, who has a history of memory loss of the past few years with behavioral changes. She is tolerated restarting Aricept 5 mg without incidence.  PLAN: Increase Aricept to 10 mg daily.  Possible side effects were discussed with patient and daughter. He printed Prescription was provided for [facility]. Patient daughter were in agreement with the above plan and had no questions. Follow up with Dr. Rexene Alberts in 3 months as previously planned on 10/27/14.  Meds ordered this encounter  Medications  . donepezil (ARICEPT ODT)  10 MG disintegrating tablet    Sig: Take 1 tablet (10 mg total) by mouth at bedtime.    Dispense:  30 tablet    Refill:  5    Order Specific Question:  Supervising Provider    Answer:  Star Age [[id number]]   Rudi Rummage Giuseppina Quinones, MSN, FNP-BC, A/GNP-C 07/26/2014, 4:22 PM [facility] [address] [place], [place] [address] [phone]  Note: This document was prepared with digital dictation and possible smart phrase technology. Any transcriptional errors that result from this process are unintentional.  I reviewed the above note and documentation by the Nurse Practitioner and agree with the history, physical exam, assessment and plan as outlined above. Star Age, MD, PhD [facility])

## 2014-07-26 NOTE — Patient Instructions (Signed)
Increase Donepizil to 10 mg daily at bedtime.  Keep next follow up appt with Dr. Frances FurbishAthar.

## 2014-08-17 ENCOUNTER — Encounter: Payer: Self-pay | Admitting: Nurse Practitioner

## 2014-08-17 ENCOUNTER — Non-Acute Institutional Stay: Payer: Medicare Other | Admitting: Nurse Practitioner

## 2014-08-17 VITALS — BP 102/62 | HR 68 | Temp 97.4°F | Ht 62.0 in | Wt 157.0 lb

## 2014-08-17 DIAGNOSIS — M810 Age-related osteoporosis without current pathological fracture: Secondary | ICD-10-CM

## 2014-08-17 DIAGNOSIS — K5900 Constipation, unspecified: Secondary | ICD-10-CM

## 2014-08-17 DIAGNOSIS — F03918 Unspecified dementia, unspecified severity, with other behavioral disturbance: Secondary | ICD-10-CM

## 2014-08-17 DIAGNOSIS — E038 Other specified hypothyroidism: Secondary | ICD-10-CM

## 2014-08-17 DIAGNOSIS — F0391 Unspecified dementia with behavioral disturbance: Secondary | ICD-10-CM

## 2014-08-17 DIAGNOSIS — I10 Essential (primary) hypertension: Secondary | ICD-10-CM

## 2014-08-17 NOTE — Progress Notes (Signed)
Patient ID: [name], female   DOB: 1926-10-22, 80 y.o.   MRN: [id number]    Nursing Home Location:  [facility]   Place of Service: Clinic (12)  PCP: Kimber RelicGREEN, ARTHUR G, MD  Allergies  Allergen Reactions  . Codeine   . Darvocet [Propoxyphene N-Acetaminophen]   . Penicillins   . Percocet [Oxycodone-Acetaminophen]   . Sulfa Antibiotics   . Sulfites   . Tobramycin-Dexamethasone     Chief Complaint  Patient presents with  . Annual Exam    Comprehensive exam: blood pressure, thyroid, cholesterol, memory     HPI:  Patient is a 80 y.o. [facility] for extended visit. Daughter with pt today. No acute concerns.  Eating well, moving bowel well. daughter manages finances. Daughter notes some decline over the past year. Pt still dresses herself, feds herself, toilet herself. No incontinence of bowel or bladder. In dec 2014 had a shoulder fracture and was requiring Assistance then but now she is independent. PT works with her 3-4 times a week.  Other than hospitalization in 2014 there has been no other hospitalization, ED visits or acute illness.  No falls since Dec 2014. Made changes in the home to help.  Walks independently, no pain noted conts to follow up with neurology, recently with increase in Aricept  Labs from May reviewed with pt and daughter  Review of Systems:  Review of Systems  Constitutional: Negative for activity change, appetite change, fatigue and unexpected weight change.  HENT: Negative for congestion and hearing loss.   Eyes: Negative.   Respiratory: Negative for cough and shortness of breath.   Cardiovascular: Negative for chest pain, palpitations and leg swelling.  Gastrointestinal: Negative for abdominal pain, diarrhea and constipation.  Genitourinary: Negative for dysuria and difficulty urinating.  Musculoskeletal: Negative for myalgias and arthralgias.  Skin:  Negative for color change and wound.  Neurological: Negative for dizziness and weakness.  Psychiatric/Behavioral: Positive for confusion. Negative for behavioral problems and agitation.    Past Medical History  Diagnosis Date  . Unspecified hypothyroidism 06/05/2005  . Unspecified vitamin D deficiency 02/03/2012  . Other malaise and fatigue 10/09/2009  . Other and unspecified hyperlipidemia 07/10/2009  . Backache, unspecified 04/10/2009  . Unspecified hearing loss 12/05/2008  . Herpes zoster with other nervous system complications(053.19) 09/12/2008  . Unspecified essential hypertension 11/02/2007  . Rash and other nonspecific skin eruption 11/02/2007  . Shortness of breath 11/02/2007  . Senile osteoporosis 07/09/2006  . Cervicalgia 09/12/2005  . Memory deficit   . Unspecified constipation   . Delusion 06/04/2013  . Closed fracture of unspecified part of upper end of humerus 09/28/2013    Right proximal humerus fracture 09/24/2013   Past Surgical History  Procedure Laterality Date  . Back surgery  2006    Dr. Lequita HaltAluisio  . Total knee arthroplasty Left 09/15/2002  . Laminectomy  03/20/2005    Centtal with Bil L3 & L4 nerve root decompression  Dr. Otelia SergeantNitka  . Total knee arthroplasty  2000    Dr. Jerl Santosalldorf  . Spine surgery  1991    Dr. Otelia SergeantNitka  . Spine surgery  1996    Dr. Salli RealNitak  . Kidney stone surgery  1959    Dr. Elige RadonBradley  . Cataract extraction, bilateral     Social History:   reports that she quit smoking about 25 years ago. She has never used smokeless tobacco. She reports that she does not drink alcohol or use  illicit drugs.  Family History  Problem Relation Age of Onset  . Heart disease Father     Medications: Patient's Medications  New Prescriptions   No medications on file  Previous Medications   ACETAMINOPHEN (TYLENOL) 325 MG TABLET    Take 650 mg by mouth 3 (three) times daily.    DENOSUMAB (PROLIA) 60 MG/ML SOLN INJECTION    Inject 60 mg into the skin once. Administer in  upper arm, thigh, or abdomen   DESVENLAFAXINE (PRISTIQ) 50 MG 24 HR TABLET    Take 50 mg by mouth. Take one tablet daily for depression   DIMETHICONE (AVEENO DAILY MOISTURIZING) 1.25 % LOTN    Apply topically. Apply to left forearm twice daily   DONEPEZIL (ARICEPT ODT) 10 MG DISINTEGRATING TABLET    Take 1 tablet (10 mg total) by mouth at bedtime.   IBUPROFEN (ADVIL,MOTRIN) 400 MG TABLET    Take 400 mg by mouth. Take one tablet 2-3 times daily as needed for pain   LEVOTHYROXINE (SYNTHROID, LEVOTHROID) 100 MCG TABLET    Take 100 mcg by mouth daily before breakfast.   MULTIPLE VITAMINS-MINERALS (CENTRUM SILVER PO)    Take one tablet once daily   POLYETHYLENE GLYCOL POWDER (GLYCOLAX/MIRALAX) POWDER    Take 17 g by mouth daily.   QUETIAPINE (SEROQUEL) 50 MG TABLET    Take 50 mg by mouth as directed. take one tablet at bedtime to treat delusion/paranoia  Modified Medications   No medications on file  Discontinued Medications   No medications on file     Physical Exam: Filed Vitals:   08/17/14 1018  BP: 102/62  Pulse: 68  Temp: 97.4 F (36.3 C)  TempSrc: Oral  Height: 5\' 2"  (1.575 m)  Weight: 157 lb (71.215 kg)  SpO2: 94%    Physical Exam  Constitutional: She appears well-developed and well-nourished. No distress.  HENT:  Head: Normocephalic and atraumatic.  Right Ear: External ear normal.  Left Ear: External ear normal.  Partial hearing loss.  Eyes: Conjunctivae and EOM are normal. Pupils are equal, round, and reactive to light.  Wears corrective lenses.  Neck: Normal range of motion. Neck supple. No thyromegaly present.  Cardiovascular: Normal rate, regular rhythm and intact distal pulses.  Exam reveals no gallop and no friction rub.   Pulmonary/Chest: Effort normal and breath sounds normal. No respiratory distress.  Abdominal: Soft. Bowel sounds are normal. She exhibits no distension. There is no tenderness.  Musculoskeletal: Normal range of motion. She exhibits no edema or  tenderness.  Neurological: She is alert. She has normal reflexes. No cranial nerve deficit. Coordination normal.  Skin: Skin is warm and dry. Rash noted. No erythema. No pallor.  Diffuse rash with excoriation from scratching. Chronic skin conditions, has seen allergist in the past.   Psychiatric: She has a normal mood and affect.    Labs reviewed: Basic Metabolic Panel:  Recent Labs  16/10/96 1532 09/28/13 03/10/14  NA 134* 132* 140  K 4.2 5.2 4.5  CL 99  --   --   CO2 22  --   --   GLUCOSE 103*  --   --   BUN 25* 19 23*  CREATININE 0.97 0.7 0.7  CALCIUM 9.1  --   --    Liver Function Tests:  Recent Labs  09/24/13 1532 03/10/14  AST 24 18  ALT 20 13  ALKPHOS 81 67  BILITOT 0.3  --   PROT 6.7  --   ALBUMIN 3.5  --  No results for input(s): LIPASE, AMYLASE in the last 8760 hours. No results for input(s): AMMONIA in the last 8760 hours. CBC:  Recent Labs  09/24/13 1532 09/28/13 03/10/14  WBC 9.9 10.5 6.8  NEUTROABS 7.3  --   --   HGB 13.6 12.4 14.1  HCT 40.4 37 43  MCV 86.5  --   --   PLT 215 265 316   TSH:  Recent Labs  12/28/13 03/10/14 07/26/14  TSH 3.02 0.91 0.97   A1C: Lab Results  Component Value Date   HGBA1C 6.0* 07/27/2013   Lipid Panel: No results for input(s): CHOL, HDL, LDLCALC, TRIG, CHOLHDL, LDLDIRECT in the last 8760 hours.  Assessment/Plan  1. Senile osteoporosis -conts with prolia  2. Other specified hypothyroidism Last TSH within Normal range, conts on synthroid 100 mcg  3. Constipation, unspecified constipation type -well controlled with current regimen   4. Essential hypertension -Patients hypertension is stable; not currently on medication for blood pressure at this time.  Will monitor and make changes as necessary.  5. Dementia with behavioral disturbance -conts to follow up with neurology, behaviors have been stable. conts on aricept daily -no significant changes in functional or cognitive status   To keep follow  up appt with Dr Chilton SiGreen
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
## 2014-09-05 ENCOUNTER — Telehealth: Payer: Self-pay | Admitting: Neurology

## 2014-09-05 NOTE — Telephone Encounter (Signed)
Confirmed appt.time change with daughter from 10/27/14

## 2014-10-27 ENCOUNTER — Ambulatory Visit: Payer: Medicare PPO | Admitting: Neurology

## 2014-10-27 ENCOUNTER — Encounter: Payer: Self-pay | Admitting: Neurology

## 2014-10-27 ENCOUNTER — Ambulatory Visit (INDEPENDENT_AMBULATORY_CARE_PROVIDER_SITE_OTHER): Payer: Medicare Other | Admitting: Neurology

## 2014-10-27 VITALS — BP 136/71 | HR 59 | Temp 97.6°F | Ht 61.0 in | Wt 160.0 lb

## 2014-10-27 DIAGNOSIS — F03918 Unspecified dementia, unspecified severity, with other behavioral disturbance: Secondary | ICD-10-CM

## 2014-10-27 DIAGNOSIS — F0391 Unspecified dementia with behavioral disturbance: Secondary | ICD-10-CM

## 2014-10-27 DIAGNOSIS — S42301D Unspecified fracture of shaft of humerus, right arm, subsequent encounter for fracture with routine healing: Secondary | ICD-10-CM

## 2014-10-27 NOTE — Patient Instructions (Signed)
Your memory scores improved, when we restarted Aricept.  We will monitor your symptoms and consider a second memory drug in the future.  We will continue for now, Aricept 10 mg once daily.

## 2014-10-27 NOTE — Progress Notes (Signed)
Subjective:    Patient ID: [name] is a 81 y.o. female.  HPI     Interim history:   [name] is a very pleasant 81 year old right-handed woman with an underlying medical history of hypertension, hypothyroidism, osteoporosis with numerous back surgeries for compression fractures and status post 2 knee replacement surgeries, who presents for followup consultation of her memory loss. She is accompanied by her daughter again today. I last saw her on 04/25/2014, at which time her daughter reported that the patient had been tolerating Aricept, but she was off of it at the time. She had fallen and broke her right humerus. We talked about all her test results including MRI brain. Her memory scores had declined. Her MMSE was 13 out of 30, down from 20 out of 30 less than a year ago. I suggested we cautiously restart Aricept. She was then seen in follow-up by our nurse practitioner, [name], on 07/26/2014 at which time her MMSE was 15 out of 30, clock drawing had improved. We decided to increase her Aricept to 10 mg daily.   Today, she reports feeling well. Thankfully she has not fallen. She has a physical therapist. She has a good appetite. She walks without assistance. She has no lightheadedness or near fainting feeling. She seems to tolerate her Aricept well. Her daughter reports no interim changes in her personality or behavior. Overall, things appear to be doing well. She has a new primary care provider, [name], with home she has an appointment in April. She denies any recent illness, cough, cold, shortness of breath or fevers.   I first met her on 07/27/2013, at which time she reported memory loss for the past years, worse in the last 6 months, when she started having trouble with her finances. At the time of her first visit with me I suggested further workup in the form of blood work which included ESR, RPR, hemoglobin A1c, and brain MRI without contrast. I suggested pursuing formal  memory testing in the future. I started her on a trial of Aricept 5 mg strength and suggested that she reduce Seroquel to 25 mg each night. Her brain MRI without contrast on 08/03/2013 showed: Abnormal MRI brain (without) demonstrating: 1. Mild diffuse atrophy. 2. Mild periventricular and subcortical foci of non-specific gliosis. 3. No acute findings. I personally reviewed the images through the PACS system. We called her daughter with the test results. In the interim, on 09/24/2013 she fell and injured her right shoulder. Right shoulder x-ray on 09/24/2013 showed a humeral head fracture. Treatment was conservative and she was in SNF for about 2 months. She has PT at [facility] currently and after that will have a Physiological scientist. Therefore, she missed an appointment with me in 12/14. She is off of Aricept since 12/14.   She did not have to deal with the financial aspects of her life until her husband died in 12/22/2004. She moved into Independent living in 12/22/06. In August, she started having delusions about somebody stealing from her. She has a safe in her closet and while most of the reported missing items have been found in various hidden places in her apartment, one box of rings has not yet been found. She has always been somewhat of a "hider". She states, that "they were there and they went through my safe and they left". She states, she was asleep and the "girls came and went through my stuff. They were taking care of another lady and decided to  come to my apartment to see what they could find". She has been on Seroquel at low dose at night which has helped her agitation but has not changed her solution. She is currently on Synthroid, multivitamin, and Seroquel 50 mg each bedtime. She resides at [facility] in the [place] where she moved to in 8/14 from the [place] area d/t agitation.   There is no FHx of AD or dementia, her brother died at age 42 with mesothelioma. She now is the  only surviving sibling of 5.   She has no prior Hx of psychiatric illness.   She had been on an antidepressant, namely Zoloft.   The patient denies prior TIA or stroke symptoms, such as sudden onset of one sided weakness, numbness, tingling, slurring of speech or droopy face, hearing loss, tinnitus, diplopia or visual field cut or monocular loss of vision, and denies recurrent headaches.   Of note, the patient does not snore, and there is no report of witnessed apneas or choking sensations while asleep. There is no associated EDS. She denies morning HAs. She has not been driving since 1/63.   Her Past Medical History Is Significant For: Past Medical History  Diagnosis Date  . Unspecified hypothyroidism 06/05/2005  . Unspecified vitamin D deficiency 02/03/2012  . Other malaise and fatigue 10/09/2009  . Other and unspecified hyperlipidemia 07/10/2009  . Backache, unspecified 04/10/2009  . Unspecified hearing loss 12/05/2008  . Herpes zoster with other nervous system complications(053.19) 84/53/6468  . Unspecified essential hypertension 11/02/2007  . Rash and other nonspecific skin eruption 11/02/2007  . Shortness of breath 11/02/2007  . Senile osteoporosis 07/09/2006  . Cervicalgia 09/12/2005  . Memory deficit   . Unspecified constipation   . Delusion 06/04/2013  . Closed fracture of unspecified part of upper end of humerus 09/28/2013    Right proximal humerus fracture 09/24/2013    Her Past Surgical History Is Significant For: Past Surgical History  Procedure Laterality Date  . Back surgery  2006    Dr. Wynelle Link  . Total knee arthroplasty Left 09/15/2002  . Laminectomy  03/20/2005    Centtal with Bil L3 & L4 nerve root decompression  Dr. Louanne Skye  . Total knee arthroplasty  2000    Dr. Rhona Raider  . Spine surgery  1991    Dr. Louanne Skye  . Spine surgery  1996    Dr. Kirstie Mirza  . Kidney stone surgery  1959    Dr. Leory Plowman  . Cataract extraction, bilateral      Her Family History Is Significant  For: Family History  Problem Relation Age of Onset  . Heart disease Father     Her Social History Is Significant For: History   Social History  . Marital Status: Widowed    Spouse Name: N/A    Number of Children: 3  . Years of Education: college   Occupational History  . homemaker    Social History Main Topics  . Smoking status: Former Smoker    Quit date: 02/22/1989  . Smokeless tobacco: Never Used  . Alcohol Use: No  . Drug Use: No  . Sexual Activity: No   Other Topics Concern  . None   Social History Narrative   Patient is Widowed, homemaker.     [facility], and previously lived in independent living apartment in same community since 2007    Stopped smoking in 1990s    No significant alcohol history  Patient has  Advanced planning documents: Living Will, POA   Exercise none           Her Allergies Are:  Allergies  Allergen Reactions  . Codeine   . Darvocet [Propoxyphene N-Acetaminophen]   . Penicillins   . Percocet [Oxycodone-Acetaminophen]   . Sulfa Antibiotics   . Sulfites   . Tobramycin-Dexamethasone   :   Her Current Medications Are:  Outpatient Encounter Prescriptions as of 10/27/2014  Medication Sig  . acetaminophen (TYLENOL) 325 MG tablet Take 650 mg by mouth 3 (three) times daily.   Marland Kitchen denosumab (PROLIA) 60 MG/ML SOLN injection Inject 60 mg into the skin once. Administer in upper arm, thigh, or abdomen  . desvenlafaxine (PRISTIQ) 50 MG 24 hr tablet Take 50 mg by mouth. Take one tablet daily for depression  . Dimethicone (AVEENO DAILY MOISTURIZING) 1.25 % LOTN Apply topically. Apply to left forearm twice daily  . donepezil (ARICEPT ODT) 10 MG disintegrating tablet Take 1 tablet (10 mg total) by mouth at bedtime.  Marland Kitchen ibuprofen (ADVIL,MOTRIN) 400 MG tablet Take 400 mg by mouth. Take one tablet 2-3 times daily as needed for pain  . levothyroxine (SYNTHROID, LEVOTHROID) 100 MCG tablet Take  100 mcg by mouth daily before breakfast.  . Multiple Vitamins-Minerals (CENTRUM SILVER PO) Take one tablet once daily  . polyethylene glycol powder (GLYCOLAX/MIRALAX) powder Take 17 g by mouth daily.  . QUEtiapine (SEROQUEL) 25 MG tablet   . QUEtiapine (SEROQUEL) 50 MG tablet Take 50 mg by mouth as directed. take one tablet at bedtime to treat delusion/paranoia  :  Review of Systems:  Out of a complete 14 point review of systems, all are reviewed and negative with the exception of these symptoms as listed below:   Review of Systems  All other systems reviewed and are negative.   Objective:  Neurologic Exam  Physical Exam Physical Examination:   Filed Vitals:   10/27/14 1359  BP: 136/71  Pulse: 59  Temp: 97.6 F (36.4 C)    General Examination: The patient is a very pleasant 81 y.o. female in no acute distress. She is calm and cooperative with the exam. She denies Auditory Hallucinations, Visual Hallucinations and Tactile Hallucinations. She is well groomed and situated in a chair.   HEENT: Normocephalic, atraumatic, pupils are equal, round and reactive to light and accommodation. Funduscopic exam is normal with sharp disc margins noted. She had bilateral cataract repairs. Extraocular tracking shows mild saccadic breakdown without nystagmus noted. Hearing is intact. Face is symmetric with no facial masking and normal facial sensation. There is no lip, neck or jaw tremor. Neck is not rigid with intact passive ROM. There are no carotid bruits on auscultation. Oropharynx exam reveals moderate mouth dryness. No significant airway crowding is noted. Mallampati is class II. Tongue protrudes centrally and palate elevates symmetrically.    Chest: is clear to auscultation without wheezing, or rhonchi, but she has mild bibasilar crackles.  Heart: sounds are regular and normal without murmurs, rubs or gallops noted.   Abdomen: is soft, non-tender and non-distended with normal bowel sounds  appreciated on auscultation.  Extremities: There is no pitting edema in the distal lower extremities bilaterally. Pedal pulses are intact.   Skin: is warm and dry with no trophic changes noted. Age-related changes are noted on the skin.   Musculoskeletal: exam reveals no obvious joint deformities, tenderness or joint swelling or erythema.    Neurologically:  Mental status: The patient is awake and alert, paying good attention.  She is unable to completely provide the history. Her daughter provides most of the Hx. She is oriented to: person, situation and day of week. Her memory, attention, language and knowledge are impaired. There is no aphasia, agnosia, apraxia or anomia. There is a mild degree of bradyphrenia. Speech is not hypophonic with no dysarthria noted. Mood is congruent and affect is normal today.   On 07/27/13: MMSE (Mini-Mental state exam): 20/30, CDT (Clock Drawing Test): 2/4, AFT (Animal Fluency Test): 3/min.  On 04/25/2014: MMSE is 13/30, clock drawing 0/4, AFT is 4/min.  On 07/26/14: MMSE: 15/30, CDT: 3/4, AFT: 1/min. Cranial nerves are as described above under HEENT exam. In addition, shoulder shrug is normal with equal shoulder height noted. Motor exam: Normal bulk, and strength for age is noted. Tone is not rigid with absence of cogwheeling in the extremities. There is overall no bradykinesia. There is no drift or rebound. There is no tremor.   Romberg is negative. Reflexes are 1+ in the upper extremities and 2+ in both knees and absent in both ankles. Fine motor skills: Finger taps, hand movements, and rapid alternating patting are not impaired bilaterally. Foot taps and foot agility are not impaired bilaterally.   Cerebellar testing shows no dysmetria or intention tremor. There is no truncal or gait ataxia.   Sensory exam is intact to light touch, pinprick, vibration, temperature sense in the upper and lower extremities.   Gait, station and balance: She stands up from the  seated position with no difficulty and needs no help. No veering to one side is noted. No leaning to one side. Posture is age-appropriate. Stance is narrow-based. She turns en bloc. Tandem walk is possible. Balance is mildly impaired.   Assessment and Plan:   In summary, [name] is a very pleasant 81 year old female with an underlying medical history of hypertension, hypothyroidism, osteoporosis with numerous back surgeries for compression Fx and s/p 2 knee replacement surgeries, as well as fall with R humerus fracture, who has a history of memory loss of the past few years with behavioral changes. She in now back on Aricept generic 10 mg daily, tolerating it. Her memory scores are a little better from before in July 2015, which is encouraging. I would like for her to continue Aricept at 10 mg daily. we will look at her memory scores and follow her clinically. I would like to see her back in 4 months or so. We can see her sooner if the need arises. I suggested no new medications but did ask the staff at [facility] to monitor for shortness of breath or cough. She has mild bibasilar crackles but no symptoms. She has been able to tolerate Aricept at this time without any obvious side effects. Her pulse has always been in the low 60s and high 50s for [place].  Down the road, we should think about adding a second memory drug such as Namenda XR.  I answered all their questions today and the patient and her daughter were in agreement.

## 2014-12-28 ENCOUNTER — Other Ambulatory Visit: Payer: Self-pay | Admitting: Internal Medicine

## 2014-12-28 DIAGNOSIS — M810 Age-related osteoporosis without current pathological fracture: Secondary | ICD-10-CM

## 2014-12-28 MED ORDER — DENOSUMAB 60 MG/ML ~~LOC~~ SOLN: 60.0000 mg | Freq: Once | SUBCUTANEOUS | Status: DC

## 2014-12-28 NOTE — Telephone Encounter (Signed)
Patient Requested Refill 

## 2015-01-04 ENCOUNTER — Ambulatory Visit: Payer: Medicare PPO

## 2015-01-04 ENCOUNTER — Encounter: Payer: Self-pay | Admitting: Internal Medicine

## 2015-01-11 ENCOUNTER — Ambulatory Visit: Payer: Medicare Other

## 2015-01-11 DIAGNOSIS — M810 Age-related osteoporosis without current pathological fracture: Secondary | ICD-10-CM | POA: Diagnosis not present

## 2015-01-11 MED ORDER — DENOSUMAB 60 MG/ML ~~LOC~~ SOLN
60.0000 mg | Freq: Once | SUBCUTANEOUS | Status: AC
  Administered 2015-01-11: 60 mg via SUBCUTANEOUS

## 2015-01-16 ENCOUNTER — Encounter: Payer: Medicare Other | Admitting: Internal Medicine

## 2015-01-17 ENCOUNTER — Encounter: Payer: Self-pay | Admitting: Internal Medicine

## 2015-01-17 ENCOUNTER — Non-Acute Institutional Stay: Payer: Medicare Other | Admitting: Internal Medicine

## 2015-01-17 VITALS — BP 110/62 | HR 60 | Temp 97.4°F | Wt 159.0 lb

## 2015-01-17 DIAGNOSIS — S4991XD Unspecified injury of right shoulder and upper arm, subsequent encounter: Secondary | ICD-10-CM | POA: Diagnosis not present

## 2015-01-17 DIAGNOSIS — M2170 Unequal limb length (acquired), unspecified site: Secondary | ICD-10-CM

## 2015-01-17 DIAGNOSIS — F0391 Unspecified dementia with behavioral disturbance: Secondary | ICD-10-CM

## 2015-01-17 DIAGNOSIS — E038 Other specified hypothyroidism: Secondary | ICD-10-CM | POA: Diagnosis not present

## 2015-01-17 DIAGNOSIS — K5900 Constipation, unspecified: Secondary | ICD-10-CM

## 2015-01-17 DIAGNOSIS — Z23 Encounter for immunization: Secondary | ICD-10-CM

## 2015-01-17 DIAGNOSIS — I10 Essential (primary) hypertension: Secondary | ICD-10-CM

## 2015-01-17 DIAGNOSIS — F03918 Unspecified dementia, unspecified severity, with other behavioral disturbance: Secondary | ICD-10-CM

## 2015-01-17 NOTE — Progress Notes (Signed)
Patient ID: [name], female   DOB: 1926-12-09, 81 y.o.   MRN: [id number]   Location:  [facility]  Allergies  Allergen Reactions  . Codeine   . Darvocet [Propoxyphene N-Acetaminophen]   . Penicillins   . Percocet [Oxycodone-Acetaminophen]   . Sulfa Antibiotics   . Sulfites   . Tobramycin-Dexamethasone     Chief Complaint  Patient presents with  . Medical Management of Chronic Issues    blood pressure, thyroid, memory. Here with daughter Olegario MessierKathy    HPI: Patient is a 81 y.o. white female seen in the office today for med mgt of chronic diseases.   AL resident here with her daughter Had fallen and broken her shoulder.  PT being continued.    Has had shingles and zostavax later.   Is doing much better now w/o complaints (aside from her memory loss).     Review of Systems:  Review of Systems  Constitutional: Negative for fever, chills and malaise/fatigue.  HENT: Negative for congestion.   Eyes: Negative for blurred vision.  Respiratory: Negative for shortness of breath.   Cardiovascular: Negative for chest pain and leg swelling.  Gastrointestinal: Negative for constipation.  Genitourinary: Negative for dysuria.  Musculoskeletal: Negative for joint pain and falls.       No new falls  Psychiatric/Behavioral: Positive for memory loss.     Past Medical History  Diagnosis Date  . Unspecified hypothyroidism 06/05/2005  . Unspecified vitamin D deficiency 02/03/2012  . Other malaise and fatigue 10/09/2009  . Other and unspecified hyperlipidemia 07/10/2009  . Backache, unspecified 04/10/2009  . Unspecified hearing loss 12/05/2008  . Herpes zoster with other nervous system complications(053.19) 09/12/2008  . Unspecified essential hypertension 11/02/2007  . Rash and other nonspecific skin eruption 11/02/2007  . Shortness of breath 11/02/2007  . Senile osteoporosis 07/09/2006  . Cervicalgia 09/12/2005  . Memory deficit   . Unspecified constipation   . Delusion  06/04/2013  . Closed fracture of unspecified part of upper end of humerus 09/28/2013    Right proximal humerus fracture 09/24/2013    Past Surgical History  Procedure Laterality Date  . Back surgery  2006    Dr. Lequita HaltAluisio  . Total knee arthroplasty Left 09/15/2002  . Laminectomy  03/20/2005    Centtal with Bil L3 & L4 nerve root decompression  Dr. Otelia SergeantNitka  . Total knee arthroplasty  2000    Dr. Jerl Santosalldorf  . Spine surgery  1991    Dr. Otelia SergeantNitka  . Spine surgery  1996    Dr. Salli RealNitak  . Kidney stone surgery  1959    Dr. Elige RadonBradley  . Cataract extraction, bilateral      Social History:   reports that she quit smoking about 25 years ago. She has never used smokeless tobacco. She reports that she does not drink alcohol or use illicit drugs.  Family History  Problem Relation Age of Onset  . Heart disease Father     Medications: Patient's Medications  New Prescriptions   No medications on file  Previous Medications   ACETAMINOPHEN (TYLENOL) 325 MG TABLET    Take 650 mg by mouth 3 (three) times daily.    DENOSUMAB (PROLIA) 60 MG/ML SOLN INJECTION    Inject 60 mg into the skin once. Administer in upper arm, thigh, or abdomen   DESVENLAFAXINE (PRISTIQ) 50 MG 24 HR TABLET    Take 50 mg by mouth. Take one tablet daily for depression   DONEPEZIL (ARICEPT ODT) 10 MG DISINTEGRATING TABLET  Take 1 tablet (10 mg total) by mouth at bedtime.   IBUPROFEN (ADVIL,MOTRIN) 400 MG TABLET    Take 400 mg by mouth. Take one tablet 2-3 times daily as needed for pain   LEVOTHYROXINE (SYNTHROID, LEVOTHROID) 100 MCG TABLET    Take 100 mcg by mouth daily before breakfast.   MULTIPLE VITAMINS-MINERALS (CENTRUM SILVER PO)    Take one tablet once daily   POLYETHYLENE GLYCOL POWDER (GLYCOLAX/MIRALAX) POWDER    Take 17 g by mouth daily.   QUETIAPINE (SEROQUEL) 25 MG TABLET    25 mg. Take one tablet in morning   QUETIAPINE (SEROQUEL) 50 MG TABLET    Take 50 mg by mouth as directed. take one tablet at bedtime to treat  delusion/paranoia  Modified Medications   No medications on file  Discontinued Medications   DIMETHICONE (AVEENO DAILY MOISTURIZING) 1.25 % LOTN    Apply topically. Apply to left forearm twice daily     Physical Exam: Filed Vitals:   01/17/15 1526  BP: 110/62  Pulse: 60  Temp: 97.4 F (36.3 C)  TempSrc: Oral  Weight: 159 lb (72.122 kg)  Physical Exam  Cardiovascular: Normal rate, regular rhythm and normal heart sounds.   Pulmonary/Chest: Effort normal and breath sounds normal.  Abdominal: Soft. Bowel sounds are normal.  Musculoskeletal: Normal range of motion.  Neurological: She is alert.  Poor short term memory and not all responses are appropriate  Skin: Skin is warm and dry.  Psychiatric: She has a normal mood and affect.    Labs reviewed: Basic Metabolic Panel:  Recent Labs  40/98/11 07/26/14  NA 140  --   K 4.5  --   BUN 23*  --   CREATININE 0.7  --   TSH 0.91 0.97   Liver Function Tests:  Recent Labs  03/10/14  AST 18  ALT 13  ALKPHOS 67   No results for input(s): LIPASE, AMYLASE in the last 8760 hours. No results for input(s): AMMONIA in the last 8760 hours. CBC:  Recent Labs  03/10/14  WBC 6.8  HGB 14.1  HCT 43  PLT 316   Lipid Panel: No results for input(s): CHOL, HDL, LDLCALC, TRIG, CHOLHDL, LDLDIRECT in the last 8760 hours. Lab Results  Component Value Date   HGBA1C 6.0* 07/27/2013    Assessment/Plan 1. Other specified hypothyroidism -cont synthroid daily  2. Constipation, unspecified constipation type -cont current bowel regimen which is effective  3. Essential hypertension -bp is at goal without meds  4. Dementia with behavioral disturbance -continues on aricept with seroquel for behaviors--sounds like this has not been a concern recently  5. Right shoulder injury, subsequent encounter -cont scheduled tylenol, may use prn motrin rarely, but monitor renal function and for gi upset--should not really use this long  term  6. Acquired short leg syndrome on right -puts her at increased fall risk  7. Need for vaccination with 13-polyvalent pneumococcal conjugate vaccine - Pneumococcal conjugate vaccine 13-valent--prevnar  Labs/tests ordered:  Needs bmp Next appt:  Keep upcoming appt  Anyae Griffith L. Sherree Shankman, D.O. Geriatrics [facility] [address] [place], [place] [address] Cell Phone (Mon-Fri 8am-5pm):  [phone] On Call:  [phone] & follow prompts after 5pm & weekends Office Phone:  [phone] Office Fax:  [phone]

## 2015-03-02 ENCOUNTER — Ambulatory Visit: Payer: Medicare Other | Admitting: Neurology

## 2015-03-06 ENCOUNTER — Encounter: Payer: Self-pay | Admitting: Neurology

## 2015-05-31 ENCOUNTER — Non-Acute Institutional Stay: Payer: Medicare Other | Admitting: Internal Medicine

## 2015-05-31 ENCOUNTER — Encounter: Payer: Self-pay | Admitting: Internal Medicine

## 2015-05-31 VITALS — BP 118/76 | HR 60 | Temp 97.4°F | Wt 163.0 lb

## 2015-05-31 DIAGNOSIS — Z66 Do not resuscitate: Secondary | ICD-10-CM

## 2015-05-31 DIAGNOSIS — F0391 Unspecified dementia with behavioral disturbance: Secondary | ICD-10-CM

## 2015-05-31 DIAGNOSIS — F22 Delusional disorders: Secondary | ICD-10-CM

## 2015-05-31 DIAGNOSIS — R441 Visual hallucinations: Secondary | ICD-10-CM

## 2015-05-31 DIAGNOSIS — F03918 Unspecified dementia, unspecified severity, with other behavioral disturbance: Secondary | ICD-10-CM

## 2015-05-31 DIAGNOSIS — L981 Factitial dermatitis: Secondary | ICD-10-CM

## 2015-05-31 DIAGNOSIS — F424 Excoriation (skin-picking) disorder: Secondary | ICD-10-CM

## 2015-05-31 DIAGNOSIS — F6389 Other impulse disorders: Secondary | ICD-10-CM

## 2015-05-31 MED ORDER — MEMANTINE HCL ER 7 & 14 & 21 &28 MG PO CP24: ORAL_CAPSULE | ORAL | Status: DC

## 2015-05-31 MED ORDER — TRIAMCINOLONE ACETONIDE 0.1 % EX CREA: 1.0000 "application " | TOPICAL_CREAM | Freq: Three times a day (TID) | CUTANEOUS | Status: DC

## 2015-05-31 NOTE — Progress Notes (Signed)
Patient ID: [name], female   DOB: 1927-01-30, 81 y.o.   MRN: [id number]   Location:  [facility]  Code Status: DNR  Goals of Care:Advanced Directive information Does patient have an advance directive?: Yes, Type of Advance Directive: Out of facility DNR (pink MOST or yellow form), Pre-existing out of facility DNR order (yellow form or pink MOST form): Yellow form placed in chart (order not valid for inpatient use)  Chief Complaint  Patient presents with  . Acute Visit    visual hallucinations of people in her room; delusional about [place]; is on seroquel  . sores    on both arms, patient digs at herself    HPI: Patient is a 81 y.o. white female with h/o dementia with delusions, hypothyroidism, hearing loss, hyperlipidemia, htn, neck pain and osteoporosis was seen in the [facility] today for an acute visit re: seeing people in her room and having delusions about [place] the [place].  She is on seroquel at this time for her psychosis.  Attempts to get urine dip have been unsuccessful due to pt removing hat from toilet.  She's been talking about a baby and also has been heard on several occasions talking to others in her room when no one else was there. She got agitated and threw herself on the bed one time.  Notes indicate that the other people in the room have been going on since March.  In my April note, she was doing a bit better in terms of behavioral concerns though her memory had continued to decline.  Also reviewed Dr. Teofilo Pod note which indicated possibly trying namenda in the future.   Pt feels like she is going to go somewhere else besides [facility] but cannot say where.  Also keeps repeating that she goes wherever the group goes which are her friends.  Unclear if these are the people she is seeing or if she truly is referring to friends.  Review of Systems:  Review of Systems  Constitutional:  Negative for fever, chills and malaise/fatigue.  HENT: Positive for hearing loss.   Eyes: Negative for blurred vision.  Respiratory: Negative for shortness of breath.   Cardiovascular: Negative for chest pain and leg swelling.  Gastrointestinal: Negative for abdominal pain, constipation, blood in stool and melena.  Genitourinary: Negative for dysuria, urgency and frequency.  Musculoskeletal: Negative for falls.  Skin: Positive for rash. Negative for itching.  Neurological: Negative for dizziness, loss of consciousness and weakness.  Endo/Heme/Allergies: Bruises/bleeds easily.  Psychiatric/Behavioral: Positive for hallucinations and memory loss. Negative for depression and suicidal ideas. The patient is not nervous/anxious and does not have insomnia.     Past Medical History  Diagnosis Date  . Unspecified hypothyroidism 06/05/2005  . Unspecified vitamin D deficiency 02/03/2012  . Other malaise and fatigue 10/09/2009  . Other and unspecified hyperlipidemia 07/10/2009  . Backache, unspecified 04/10/2009  . Unspecified hearing loss 12/05/2008  . Herpes zoster with other nervous system complications(053.19) 09/12/2008  . Unspecified essential hypertension 11/02/2007  . Rash and other nonspecific skin eruption 11/02/2007  . Shortness of breath 11/02/2007  . Senile osteoporosis 07/09/2006  . Cervicalgia 09/12/2005  . Memory deficit   . Unspecified constipation   . Delusion 06/04/2013  . Closed fracture of unspecified part of upper end of humerus 09/28/2013    Right proximal humerus fracture 09/24/2013    Past Surgical History  Procedure  Laterality Date  . Back surgery  2006    Dr. Lequita Halt  . Total knee arthroplasty Left 09/15/2002  . Laminectomy  03/20/2005    Centtal with Bil L3 & L4 nerve root decompression  Dr. Otelia Sergeant  . Total knee arthroplasty  2000    Dr. Jerl Santos  . Spine surgery  1991    Dr. Otelia Sergeant  . Spine surgery  1996    Dr. Salli Real  . Kidney stone surgery  1959    Dr. Elige Radon  .  Cataract extraction, bilateral      Social History:   reports that she quit smoking about 26 years ago. She has never used smokeless tobacco. She reports that she does not drink alcohol or use illicit drugs.  Allergies  Allergen Reactions  . Codeine   . Darvocet [Propoxyphene N-Acetaminophen]   . Penicillins   . Percocet [Oxycodone-Acetaminophen]   . Sulfa Antibiotics   . Sulfites   . Tobramycin-Dexamethasone     Medications: Patient's Medications  New Prescriptions   No medications on file  Previous Medications   ACETAMINOPHEN (TYLENOL) 325 MG TABLET    Take 650 mg by mouth 3 (three) times daily.    DENOSUMAB (PROLIA) 60 MG/ML SOLN INJECTION    Inject 60 mg into the skin once. Administer in upper arm, thigh, or abdomen   DESVENLAFAXINE (PRISTIQ) 50 MG 24 HR TABLET    Take 50 mg by mouth. Take one tablet daily for depression   DONEPEZIL (ARICEPT ODT) 10 MG DISINTEGRATING TABLET    Take 1 tablet (10 mg total) by mouth at bedtime.   IBUPROFEN (ADVIL,MOTRIN) 400 MG TABLET    Take 400 mg by mouth. Take one tablet 2-3 times daily as needed for pain   LEVOTHYROXINE (SYNTHROID, LEVOTHROID) 100 MCG TABLET    Take 100 mcg by mouth daily before breakfast.   MULTIPLE VITAMINS-MINERALS (CENTRUM SILVER PO)    Take one tablet once daily   POLYETHYLENE GLYCOL POWDER (GLYCOLAX/MIRALAX) POWDER    Take 17 g by mouth daily.   QUETIAPINE (SEROQUEL) 25 MG TABLET    25 mg. Take one tablet in morning   QUETIAPINE (SEROQUEL) 50 MG TABLET    Take 50 mg by mouth as directed. take one tablet at bedtime to treat delusion/paranoia  Modified Medications   No medications on file  Discontinued Medications   No medications on file     Physical Exam: Filed Vitals:   05/31/15 1555  BP: 118/76  Pulse: 60  Temp: 97.4 F (36.3 C)  TempSrc: Oral  Weight: 163 lb (73.936 kg)  SpO2: 92%   Body mass index is 30.81 kg/(m^2). Physical Exam  Constitutional: She appears well-developed and well-nourished. No  distress.  Cardiovascular: Normal rate, regular rhythm, normal heart sounds and intact distal pulses.   Pulmonary/Chest: Effort normal and breath sounds normal.  Musculoskeletal: Normal range of motion.  Walks w/o assistive device  Neurological: She is alert.  Skin:  Bilateral arms with excoriations and several eschars from picking     Labs reviewed: Basic Metabolic Panel:  Recent Labs  60/45/40  TSH 0.97   Liver Function Tests: No results for input(s): AST, ALT, ALKPHOS, BILITOT, PROT, ALBUMIN in the last 8760 hours. No results for input(s): LIPASE, AMYLASE in the last 8760 hours. No results for input(s): AMMONIA in the last 8760 hours. CBC: No results for input(s): WBC, NEUTROABS, HGB, HCT, MCV, PLT in the last 8760 hours. Lipid Panel: No results for input(s): CHOL, HDL, LDLCALC, TRIG, CHOLHDL, LDLDIRECT  in the last 8760 hours. Lab Results  Component Value Date   HGBA1C 6.0* 07/27/2013   Patient Care Team: Kermit Balo, DO as PCP - General (Geriatric Medicine) [facility] Kerrin Champagne, MD as Consulting Physician (Orthopedic Surgery) Archer Asa, MD as Consulting Physician (Psychiatry)  Assessment/Plan 1. Visual hallucinations -ongoing problem--people in her apt; however, for the most part, they do not seem to be disturbing her, causing crying, anger, stress except the occasion of throwing herself on the bed -already on seroquel--unclear if this is helping at all -will treat her dementia more aggressively by adding namenda XR to her med regimen  2. Dementia, senile with delusions, with behavioral disturbance -add namenda XR titration pack  -also cont aricept 10mg   -redirection and calming pt are best approaches -trying to avoid adding addition antipsychotics or behavior meds which can be sedating or cause falls -if she is getting tearful/angry/upset by the hallucinations or delusions or it is causing a safety issue, would change her  antipsychotic  3. Advance directive indicates patient wish for do-not-resuscitate status -reviewed and cont with DNR status -has hcpoa and living will on file - DNR (Do Not Resuscitate)  4. Picking own skin -seems this is dementia and related anxiety related -will consider adding low dose ssri if she is not improving with topical intervention (I understand she's had other creams in the past) - triamcinolone cream (KENALOG) 0.1 %; Apply 1 application topically 3 (three) times daily.  Dispense: 45 g; Refill: 0  Labs/tests ordered:  No new Next appt:  Keep physical in Nov  Araceli Coufal L. Tanisia Yokley, D.O. Geriatrics [facility] [address], [place] [address] Cell Phone (Mon-Fri 8am-5pm):  [phone] On Call:  [phone] & follow prompts after 5pm & weekends Office Phone:  [phone] Office Fax:  [phone]

## 2015-07-19 ENCOUNTER — Non-Acute Institutional Stay (INDEPENDENT_AMBULATORY_CARE_PROVIDER_SITE_OTHER): Payer: Medicare Other | Admitting: Internal Medicine

## 2015-07-19 DIAGNOSIS — M810 Age-related osteoporosis without current pathological fracture: Secondary | ICD-10-CM

## 2015-07-19 MED ORDER — DENOSUMAB 60 MG/ML ~~LOC~~ SOLN
60.0000 mg | Freq: Once | SUBCUTANEOUS | Status: AC
  Administered 2015-07-19: 60 mg via SUBCUTANEOUS

## 2015-07-20 NOTE — Progress Notes (Signed)
Kaylyn Layer, CMA, gave her prolia for her osteoporosis.

## 2015-08-15 ENCOUNTER — Non-Acute Institutional Stay: Payer: Medicare Other | Admitting: Internal Medicine

## 2015-08-15 DIAGNOSIS — F03918 Unspecified dementia, unspecified severity, with other behavioral disturbance: Secondary | ICD-10-CM

## 2015-08-15 DIAGNOSIS — M810 Age-related osteoporosis without current pathological fracture: Secondary | ICD-10-CM

## 2015-08-15 DIAGNOSIS — E038 Other specified hypothyroidism: Secondary | ICD-10-CM

## 2015-08-15 DIAGNOSIS — R441 Visual hallucinations: Secondary | ICD-10-CM

## 2015-08-15 DIAGNOSIS — F22 Delusional disorders: Secondary | ICD-10-CM

## 2015-08-15 DIAGNOSIS — K5900 Constipation, unspecified: Secondary | ICD-10-CM

## 2015-08-15 DIAGNOSIS — I10 Essential (primary) hypertension: Secondary | ICD-10-CM

## 2015-08-15 DIAGNOSIS — F0391 Unspecified dementia with behavioral disturbance: Secondary | ICD-10-CM | POA: Diagnosis not present

## 2015-08-15 NOTE — Progress Notes (Signed)
Patient ID: [name], female   DOB: [birth year], 81 y.o.   MRN: [id number]  Provider:  Gwenith Spitz. Renato Gails, D.O., C.M.D. Location:  [facility] PCP: Zygmund Passero, DO  Code Status: DNR Goals of Care: Advanced Directive information Does patient have an advance directive?: Yes, Type of Advance Directive: Healthcare Power of [place];Living will;Out of facility DNR (pink MOST or yellow form), Pre-existing out of facility DNR order (yellow form or pink MOST form): Yellow form placed in chart (order not valid for inpatient use)   Chief Complaint  Patient presents with  . New Evaluation    upon admission to [facility] memory care unit from AL apt    HPI: Patient is a 81 y.o. female seen today for admission to [facility] memory care unit from her assisted living apt.  She was last seen in clinic with problems with hallucinations and paranoia.  There has been discussion about her need for [facility] for several months now.  She talks to people in her room quite a bit that staff do not hear or see.  She has had paranoid delusions historically.  Since admission over here on 10/27, she had difficulty at first being very angry with her daughter and going to "wring her neck", but she was pleasant to her when she arrived.  She has been wandering around the unit and continues to talk to people in her room who are not there.  She is independent to limited assist with ADLs, She has resisted care in the past, and tends to do better in mornings and have some sundowning issues in the afternoons.  Yesterday afternoon, she packed up her things and was seeking out her husband.    When seen, her trainer was with her and they were lifting light weights.  She was very pleasant, happy and conversive.  She denied any concerns except some knee pain here and there.  She seems to be adjusting well to her new environment.  Review of Systems  Constitutional: Negative for fever and chills.  HENT: Positive  for hearing loss. Negative for congestion.   Eyes:       Glasses  Respiratory: Negative for shortness of breath.   Cardiovascular: Negative for chest pain and leg swelling.       Has had edema before but not today  Gastrointestinal: Positive for constipation.  Genitourinary: Negative for dysuria.  Musculoskeletal: Negative for falls.  Skin: Negative for rash.  Neurological: Negative for dizziness and loss of consciousness.  Psychiatric/Behavioral: Positive for hallucinations and memory loss. Negative for depression. The patient does not have insomnia.        Wandering and delusions    Past Medical History  Diagnosis Date  . Unspecified hypothyroidism 06/05/2005  . Unspecified vitamin D deficiency 02/03/2012  . Other malaise and fatigue 10/09/2009  . Other and unspecified hyperlipidemia 07/10/2009  . Backache, unspecified 04/10/2009  . Unspecified hearing loss 12/05/2008  . Herpes zoster with other nervous system complications(053.19) 09/12/2008  . Unspecified essential hypertension 11/02/2007  . Rash and other nonspecific skin eruption 11/02/2007  . Shortness of breath 11/02/2007  . Senile osteoporosis 07/09/2006  . Cervicalgia 09/12/2005  . Memory deficit   . Unspecified constipation   . Delusion 06/04/2013  . Closed fracture of unspecified part of upper end of humerus 09/28/2013    Right proximal humerus fracture 09/24/2013   Past Surgical History  Procedure Laterality Date  . Back surgery  2006    Dr. Lequita Halt  .  Total knee arthroplasty Left 09/15/2002  . Laminectomy  03/20/2005    Centtal with Bil L3 & L4 nerve root decompression  Dr. Otelia SergeantNitka  . Total knee arthroplasty  2000    Dr. Jerl Santosalldorf  . Spine surgery  1991    Dr. Otelia SergeantNitka  . Spine surgery  1996    Dr. Salli RealNitak  . Kidney stone surgery  1959    Dr. Elige RadonBradley  . Cataract extraction, bilateral      reports that she quit smoking about 26 years ago. She has never used smokeless tobacco. She reports that she does not drink alcohol or  use illicit drugs. Family History  Problem Relation Age of Onset  . Heart disease Father     Allergies  Allergen Reactions  . Codeine   . Darvocet [Propoxyphene N-Acetaminophen]   . Penicillins   . Percocet [Oxycodone-Acetaminophen]   . Sulfa Antibiotics   . Sulfites   . Tobramycin-Dexamethasone       Medication List       This list is accurate as of: 08/15/15 10:40 AM.  Always use your most recent med list.               acetaminophen 325 MG tablet  Commonly known as:  TYLENOL  Take 650 mg by mouth 3 (three) times daily.     CENTRUM SILVER PO  Take one tablet once daily     denosumab 60 MG/ML Soln injection  Commonly known as:  PROLIA  Inject 60 mg into the skin once. Administer in upper arm, thigh, or abdomen     desvenlafaxine 50 MG 24 hr tablet  Commonly known as:  PRISTIQ  Take 50 mg by mouth. Take one tablet daily for depression     donepezil 10 MG disintegrating tablet  Commonly known as:  ARICEPT ODT  Take 1 tablet (10 mg total) by mouth at bedtime.     ibuprofen 400 MG tablet  Commonly known as:  ADVIL,MOTRIN  Take 400 mg by mouth. Take one tablet 2-3 times daily as needed for pain     levothyroxine 100 MCG tablet  Commonly known as:  SYNTHROID, LEVOTHROID  Take 100 mcg by mouth daily before breakfast.     Memantine HCl ER 7 & 14 & 21 &28 MG Cp24  Commonly known as:  NAMENDA XR TITRATION PACK  Take as directed     polyethylene glycol powder powder  Commonly known as:  GLYCOLAX/MIRALAX  Take 17 g by mouth daily.     QUEtiapine 50 MG tablet  Commonly known as:  SEROQUEL  Take 50 mg by mouth as directed. take one tablet at bedtime to treat delusion/paranoia     QUEtiapine 25 MG tablet  Commonly known as:  SEROQUEL  25 mg. Take one tablet in morning     triamcinolone cream 0.1 %  Commonly known as:  KENALOG  Apply 1 application topically 3 (three) times daily.        Physical Exam: There were no vitals filed for this visit. There is  no weight on file to calculate BMI. Physical Exam  Constitutional: She appears well-developed and well-nourished. No distress.  HENT:  Head: Normocephalic and atraumatic.  Right Ear: External ear normal.  Left Ear: External ear normal.  Nose: Nose normal.  Mouth/Throat: Oropharynx is clear and moist. No oropharyngeal exudate.  Hearing aides  Eyes: Conjunctivae and EOM are normal. Pupils are equal, round, and reactive to light.  glasses  Neck: Neck supple. No JVD present.  Cardiovascular: Normal rate, regular rhythm and intact distal pulses.   Pulmonary/Chest: Effort normal and breath sounds normal. No respiratory distress. She has no wheezes.  Abdominal: Soft. Bowel sounds are normal. She exhibits no distension and no mass. There is no tenderness.  Musculoskeletal: Normal range of motion.  Uses walker  Lymphadenopathy:    She has no cervical adenopathy.  Neurological: She is alert.  Oriented to person only  Skin: Skin is warm and dry.  Some abrasions which appear to be healing on arms  Psychiatric: She has a normal mood and affect.  Very pleasant today    Labs reviewed: Needs labs abstracted from chart but not noted until visit completed   Lab Results  Component Value Date   HGBA1C 6.0* 07/27/2013   Lab Results  Component Value Date   TSH 0.97 07/26/2014   Imaging and Procedures obtained prior to [facility] AL admission: Reviewed MRI brain from 08/03/13:  Abnormal MRI brain (without) demonstrating: 1. Mild diffuse atrophy. 2. Mild periventricular and subcortical foci of non-specific gliosis. 3. No acute findings  Last bone density 07/17/10? With osteoporosis and T score for left forearm of -4.3, left femur -2.2  09/24/13:  Right shoulder xrays:  Humeral head fx (comminuted, impacted with intra-articular extension and lateral angulation)  Assessment/Plan 1. Dementia, senile with delusions, with behavioral disturbance -adjusting fairly well to her new location -cont namenda  XR  and aricept  po daily  2. Visual hallucinations -may be due to depression -continue seroquel  in am and  at hs -these are still active -and paranoid delusions have led to significant distress -will monitor and see if these can be reduced over time or replaced with alternative agents, but she does have true psychosis  3. Other specified hypothyroidism -cont synthroid daily before breakfast -last tsh at goal  4. Constipation, unspecified constipation type -stable with miralax daily   5. Essential hypertension -bp at goal without meds, monitor  6. Senile osteoporosis -had prior humeral fx and the one dexa on file in epic shows severe OP in 2011 -has gotten prolia injections--04/12/13, (missed dec dose due to fall with humeral fx), 12/20/13, 07/06/14, 01/11/15, 07/19/15 -if dexa has not been repeated, it should be  Functional status: requires some cuing for some adls, but mostly independent at this time  Family/ staff Communication: discussed with [address] LPN  45 mins were spent reviewing records, labs, seeing pt and coordinating care with nursing staff  Labs/tests:  Will need f/u bone density if not done since 2011 (suspect it was but not abstracted b/c she has been getting prolia), may need f/u though; also needs to have labs abstracted  Jolynne Spurgin L. Haley Fuerstenberg, D.O. Geriatrics [facility] [address], [place] [address] Cell Phone (Mon-Fri 8am-5pm):  [phone] On Call:  [phone] & follow prompts after 5pm & weekends Office Phone:  [phone] Office Fax:  [phone]

## 2015-08-23 ENCOUNTER — Encounter: Payer: Self-pay | Admitting: Internal Medicine

## 2015-08-27 ENCOUNTER — Encounter: Payer: Self-pay | Admitting: Internal Medicine

## 2015-08-27 MED ORDER — MEMANTINE HCL ER 28 MG PO CP24: 28.0000 mg | ORAL_CAPSULE | Freq: Every day | ORAL | Status: DC

## 2015-08-29 NOTE — Addendum Note (Signed)
Addended by: Bufford SpikesEED, Lochlan Grygiel L on: 08/29/2015 09:08 AM   Modules accepted: Level of Service

## 2015-09-02 LAB — LIPID PANEL
Cholesterol: 211 mg/dL — AB (ref 0–200)
HDL: 47 mg/dL (ref 35–70)
LDL CALC: 136 mg/dL
TRIGLYCERIDES: 140 mg/dL (ref 40–160)

## 2015-09-02 LAB — BASIC METABOLIC PANEL
BUN: 18 mg/dL (ref 4–21)
Creatinine: 0.7 mg/dL (ref 0.5–1.1)
Glucose: 88 mg/dL
Potassium: 4.2 mmol/L (ref 3.4–5.3)
SODIUM: 141 mmol/L (ref 137–147)

## 2015-09-02 LAB — CBC AND DIFFERENTIAL
HEMATOCRIT: 42 % (ref 36–46)
HEMOGLOBIN: 13.6 g/dL (ref 12.0–16.0)
Platelets: 328 10*3/uL (ref 150–399)
WBC: 7.6 10^3/mL

## 2015-09-02 LAB — TSH: TSH: 0.35 u[IU]/mL — AB (ref 0.41–5.90)

## 2015-09-06 ENCOUNTER — Other Ambulatory Visit: Payer: Self-pay | Admitting: Internal Medicine

## 2015-09-06 DIAGNOSIS — M810 Age-related osteoporosis without current pathological fracture: Secondary | ICD-10-CM

## 2015-10-12 ENCOUNTER — Ambulatory Visit
Admission: RE | Admit: 2015-10-12 | Discharge: 2015-10-12 | Disposition: A | Discharge status: Home / Self Care | Payer: Medicare Other | Source: Ambulatory Visit | Attending: Internal Medicine | Admitting: Internal Medicine

## 2015-10-12 DIAGNOSIS — M810 Age-related osteoporosis without current pathological fracture: Secondary | ICD-10-CM

## 2015-10-26 ENCOUNTER — Non-Acute Institutional Stay: Payer: Medicare Other | Admitting: Adult Health

## 2015-10-26 ENCOUNTER — Encounter: Payer: Self-pay | Admitting: Adult Health

## 2015-10-26 DIAGNOSIS — E559 Vitamin D deficiency, unspecified: Secondary | ICD-10-CM

## 2015-10-26 DIAGNOSIS — F0391 Unspecified dementia with behavioral disturbance: Secondary | ICD-10-CM | POA: Diagnosis not present

## 2015-10-26 DIAGNOSIS — F03918 Unspecified dementia, unspecified severity, with other behavioral disturbance: Secondary | ICD-10-CM

## 2015-10-26 DIAGNOSIS — E038 Other specified hypothyroidism: Secondary | ICD-10-CM

## 2015-10-26 DIAGNOSIS — M810 Age-related osteoporosis without current pathological fracture: Secondary | ICD-10-CM | POA: Diagnosis not present

## 2015-10-26 NOTE — Progress Notes (Addendum)
Patient ID: [name], female   DOB: 1926/10/29, 82 y.o.   MRN: [id number]     Nursing Home Location:  [facility]   Code Status: DNR  Patient Care Team: Kermit Baloiffany L Reed, DO as PCP - General (Geriatric Medicine) [facility] Kerrin ChampagneJames E Nitka, MD as Consulting Physician (Orthopedic Surgery) Archer AsaGerald Plovsky, MD as Consulting Physician (Psychiatry)   [facility]  Chief Complaint  Patient presents with  . Medical Management of Chronic Issues    HPI:  82 [facility]. [facility]. I am here to review her chronic medical issues.  VS have been stable over the past month. Weight has remained stable at 167 lbs. Moved to memory care in November and adjusting fairly well. Has dementia with behavioral disturbance with delusions that she is a young girl. These delusions do not upset her most of the time, but does have occasional episodes of tearful per staff.  Has OP and currently taking Prolia. Dexa scan of heel only shows a T score of -1.8 score, indicating OP.  Staff is working on sending her back for a full dexa scan, which was previously ordered.  MMSE score on 07/13/15 15/30, failed clock, no change from previous score. Resident communicative, ambulatory and intermittently continent.      Review of Systems:  Review of Systems  Constitutional: Negative for fever, chills, diaphoresis, activity change and appetite change.  HENT: Positive for congestion.   Respiratory: Negative for cough and shortness of breath.   Cardiovascular: Negative for chest pain and leg swelling.  Genitourinary: Negative for dysuria and difficulty urinating.  Musculoskeletal: Negative for back pain and arthralgias.  Skin: Negative for color change and wound.  Neurological: Negative for dizziness, facial asymmetry, speech difficulty and light-headedness.  Psychiatric/Behavioral: Positive for  behavioral problems, confusion and agitation. The patient is not nervous/anxious.        Delusions    Medications: Patient's Medications  New Prescriptions   No medications on file  Previous Medications   ACETAMINOPHEN (TYLENOL) 325 MG TABLET    Take 650 mg by mouth 3 (three) times daily.    CARBAMIDE PEROXIDE (DEBROX) 6.5 % OTIC SOLUTION    5 drops at bedtime.   CHOLECALCIFEROL 2000 UNITS CAPS    Take 1 capsule by mouth.   DENOSUMAB (PROLIA) 60 MG/ML SOLN INJECTION    Inject 60 mg into the skin once. Administer in upper arm, thigh, or abdomen   DESVENLAFAXINE (PRISTIQ) 50 MG 24 HR TABLET    Take 50 mg by mouth. Take one tablet daily for depression   DONEPEZIL (ARICEPT ODT) 10 MG DISINTEGRATING TABLET    Take 1 tablet (10 mg total) by mouth at bedtime.   IBUPROFEN (ADVIL,MOTRIN) 400 MG TABLET    Take 400 mg by mouth. Take one tablet 2-3 times daily as needed for pain   LEVOTHYROXINE (SYNTHROID, LEVOTHROID) 100 MCG TABLET    Take 100 mcg by mouth daily before breakfast.   LORAZEPAM (ATIVAN) 0.5 MG TABLET    Take 0.5 mg by mouth every 8 (eight) hours as needed for anxiety.   MEMANTINE (NAMENDA XR) 28 MG CP24 24 HR CAPSULE    Take 1 capsule (28 mg total) by mouth daily.   MULTIPLE VITAMINS-MINERALS (CENTRUM SILVER PO)    Take one tablet once daily   POLYETHYLENE GLYCOL POWDER (GLYCOLAX/MIRALAX) POWDER    Take 17 g by mouth daily.   QUETIAPINE (SEROQUEL) 25  MG TABLET    25 mg. Take one tablet in morning   TRIAMCINOLONE CREAM (KENALOG) 0.1 %    Apply 1 application topically 3 (three) times daily.  Modified Medications   No medications on file  Discontinued Medications   QUETIAPINE (SEROQUEL) 50 MG TABLET    Take 50 mg by mouth as directed. take 1/2 tablet at bedtime to treat delusion/paranoia     Physical Exam:  Filed Vitals:   10/26/15 1506  Weight: 167 lb (75.751 kg)    Physical Exam  Constitutional: No distress.  Neck: Normal range of motion. Neck supple. No JVD present.    Cardiovascular: Normal rate and regular rhythm.   No murmur heard. No edema  Pulmonary/Chest: Effort normal and breath sounds normal. No respiratory distress.  Abdominal: Soft. Bowel sounds are normal. She exhibits no distension.  Neurological: She is alert. No cranial nerve deficit.  Oriented to self and enviornment  Skin: Skin is warm and dry. She is not diaphoretic.  Psychiatric: Affect normal.  Things she is a little girl    Wt Readings from Last 3 Encounters:  10/26/15 167 lb (75.751 kg)  08/15/15 164 lb (74.39 kg)  05/31/15 163 lb (73.936 kg)     Labs reviewed/Significant Diagnostic Results:  Basic Metabolic Panel:  Recent Labs  16/10/96  NA 141  K 4.2  BUN 18  CREATININE 0.7   Liver Function Tests: No results for input(s): AST, ALT, ALKPHOS, BILITOT, PROT, ALBUMIN in the last 8760 hours. No results for input(s): LIPASE, AMYLASE in the last 8760 hours. No results for input(s): AMMONIA in the last 8760 hours. CBC:  Recent Labs  09/02/15  WBC 7.6  HGB 13.6  HCT 42  PLT 328   CBG: No results for input(s): GLUCAP in the last 8760 hours. TSH:  Recent Labs  09/02/15  TSH 0.35*   A1C: Lab Results  Component Value Date   HGBA1C 6.0* 07/27/2013   Lipid Panel:  Recent Labs  09/02/15  CHOL 211*  HDL 47  LDLCALC 136  TRIG 045       Assessment/Plan  1. Dementia with behavioral disturbance -stable function and cogition -remains with delusions, but overall is pleasant and not anxious about them -would continue seroquel, aricept, and namenda -resident also followed by Neurology, Dr. Frances Furbish  2. Senile osteoporosis -continue prolia and calcium supplement -still needs dexa, issues with insurance coverage -had prior humeral fx and the one dexa on file in epic shows severe OP in 2011 -has gotten prolia injections--04/12/13, (missed dec dose due to fall with humeral fx), 12/20/13, 07/06/14, 01/11/15, 07/19/15  3. Other specified hypothyroidism -stable,  continue current dose of synthroid  4. Vitamin D deficiency -continue vit d supplementation      Peggye Ley, ANP [facility] [phone]

## 2015-11-14 ENCOUNTER — Telehealth: Payer: Self-pay

## 2015-11-14 NOTE — Telephone Encounter (Signed)
Received letter from Nestor Ramp, that Namenda XR 28 mg is no longer on the [employer] covered drug list. Request switching to the generic memantine covered by [employer]. Per Dr. Suzi Roots [facility])  po twice daily.  Order written for Memory Care at [facility].

## 2015-12-05 ENCOUNTER — Non-Acute Institutional Stay: Payer: Medicare Other | Admitting: Internal Medicine

## 2015-12-05 ENCOUNTER — Encounter: Payer: Self-pay | Admitting: Internal Medicine

## 2015-12-05 DIAGNOSIS — F0391 Unspecified dementia with behavioral disturbance: Secondary | ICD-10-CM | POA: Diagnosis not present

## 2015-12-05 DIAGNOSIS — I10 Essential (primary) hypertension: Secondary | ICD-10-CM

## 2015-12-05 DIAGNOSIS — M810 Age-related osteoporosis without current pathological fracture: Secondary | ICD-10-CM

## 2015-12-05 DIAGNOSIS — K5901 Slow transit constipation: Secondary | ICD-10-CM

## 2015-12-05 DIAGNOSIS — E038 Other specified hypothyroidism: Secondary | ICD-10-CM

## 2015-12-05 DIAGNOSIS — F03918 Unspecified dementia, unspecified severity, with other behavioral disturbance: Secondary | ICD-10-CM

## 2015-12-05 NOTE — Progress Notes (Signed)
Patient ID: [name], female   DOB: [birth year], 82 y.o.   MRN: [id number]  Location[facility] Room Number: [address] Place of Service:  ALF [phone]) Provider:  Dailen Mcclish L. Renato Gails, D.O., C.M.D.  Bufford Spikes, DO  Patient Care Team: Kermit Balo, DO as PCP - General (Geriatric Medicine) [facility] Kerrin Champagne, MD as Consulting Physician (Orthopedic Surgery) Archer Asa, MD as Consulting Physician (Psychiatry)  Extended Emergency Contact Information Primary Emergency Contact: [name],[name] Address: [phone]          [place] [address] [place] Home Phone: [phone] Mobile Phone: [phone] Relation: None  Code Status:  DNR Goals of care: Advanced Directive information Advanced Directives 12/05/2015  Does patient have an advance directive? Yes  Type of Estate agent of [place];Living will;Out of facility DNR (pink MOST or yellow form)  Copy of advanced directive(s) in chart? Yes  Pre-existing out of facility DNR order (yellow form or pink MOST form) Yellow form placed in chart (order not valid for inpatient use)     Chief Complaint  Patient presents with  . Medical Management of Chronic Issues    routine    HPI:  Pt is a 82 y.o. female seen today for medical management of chronic diseases--she has chronic constipation, htn, hypothyroidism, senile osteoporosis (prior right humeral fx), hyperlipidemia, dementia with behaviors and delusions, and neck arthritis.  Staff reported no new concerns whatsoever.    Weight has remained stable at 170 lbs. Moved to memory care in November and adjusting fairly well. Has dementia with behavioral disturbance with delusions that she is a young girl. These delusions do not upset her most of the time, but does have occasional episodes of tearful per staff.  Has OP and currently taking Prolia. Heel scan shows a T score of  -1.8 score, indicating OP.Insurance did not want to cover the full bone density test since the heel scan was performed, unfortunately b/c those are not adequate.   MMSE score on 07/13/15 15/30, failed clock, no change from previous score. Resident communicative, ambulatory with a walker and intermittently continent.   Past Medical History  Diagnosis Date  . Unspecified hypothyroidism 06/05/2005  . Unspecified vitamin D deficiency 02/03/2012  . Other malaise and fatigue 10/09/2009  . Other and unspecified hyperlipidemia 07/10/2009  . Backache, unspecified 04/10/2009  . Unspecified hearing loss 12/05/2008  . Herpes zoster with other nervous system complications(053.19) 09/12/2008  . Unspecified essential hypertension 11/02/2007  . Rash and other nonspecific skin eruption 11/02/2007  . Shortness of breath 11/02/2007  . Senile osteoporosis 07/09/2006  . Cervicalgia 09/12/2005  . Memory deficit   . Unspecified constipation   . Delusion (HCC) 06/04/2013  . Closed fracture of unspecified part of upper end of humerus 09/28/2013    Right proximal humerus fracture 09/24/2013   Past Surgical History  Procedure Laterality Date  . Back surgery  2006    Dr. Lequita Halt  . Total knee arthroplasty Left 09/15/2002  . Laminectomy  03/20/2005    Centtal with Bil L3 & L4 nerve root decompression  Dr. Otelia Sergeant  . Total knee arthroplasty  2000    Dr. Jerl Santos  . Spine surgery  1991    Dr. Otelia Sergeant  . Spine surgery  1996    Dr. Salli Real  . Kidney stone surgery  1959    Dr. Elige Radon  . Cataract extraction, bilateral      Allergies  Allergen  Reactions  . Codeine   . Darvocet [Propoxyphene N-Acetaminophen]   . Erythromycin   . Hydantoins   . Lyrica [Pregabalin]   . Morphine And Related   . Penicillins   . Percocet [Oxycodone-Acetaminophen]   . Quaternium-15   . Sulfa Antibiotics   . Sulfites   . Tobramycin-Dexamethasone       Medication List       This list is accurate as of: 12/05/15 11:59 PM.  Always use  your most recent med list.               acetaminophen 325 MG tablet  Commonly known as:  TYLENOL  Take 650 mg by mouth 3 (three) times daily.     CALCIUM 600+D3 600-800 MG-UNIT Tabs  Generic drug:  Calcium Carb-Cholecalciferol  Take by mouth. Take twice daily     carbamide peroxide 6.5 % otic solution  Commonly known as:  DEBROX  5 drops at bedtime.     CENTRUM SILVER PO  Take one tablet once daily     Cholecalciferol 2000 units Caps  Take 1 capsule by mouth.     denosumab 60 MG/ML Soln injection  Commonly known as:  PROLIA  Inject 60 mg into the skin once. Administer in upper arm, thigh, or abdomen     desvenlafaxine 50 MG 24 hr tablet  Commonly known as:  PRISTIQ  Take 50 mg by mouth. Take one tablet daily for depression     donepezil 10 MG disintegrating tablet  Commonly known as:  ARICEPT ODT  Take 1 tablet (10 mg total) by mouth at bedtime.     ibuprofen 400 MG tablet  Commonly known as:  ADVIL,MOTRIN  Take 400 mg by mouth. Take one tablet 2-3 times daily as needed for pain     levothyroxine 100 MCG tablet  Commonly known as:  SYNTHROID, LEVOTHROID  Take 100 mcg by mouth daily before breakfast.     LORazepam 0.5 MG tablet  Commonly known as:  ATIVAN  Take 0.5 mg by mouth every 8 (eight) hours as needed for anxiety.     memantine 10 MG tablet  Commonly known as:  NAMENDA  Take 10 mg by mouth 2 (two) times daily. For dementia     polyethylene glycol powder powder  Commonly known as:  GLYCOLAX/MIRALAX  Take 17 g by mouth daily.     QUEtiapine 25 MG tablet  Commonly known as:  SEROQUEL  25 mg. Take one tablet in morning        Review of Systems  Constitutional: Negative for fever, chills and weight loss.  HENT: Negative for congestion and hearing loss.   Eyes: Negative for blurred vision.       Glasses  Respiratory: Negative for shortness of breath.   Gastrointestinal: Negative for abdominal pain, constipation, blood in stool and melena.   Genitourinary:       Some incontinence  Musculoskeletal: Negative for falls.       Ambulates with walker  Skin: Negative for rash.  Neurological: Negative for dizziness, loss of consciousness and weakness.  Endo/Heme/Allergies:       Hypothyroid  Psychiatric/Behavioral: Positive for memory loss.       Delusions    Immunization History  Administered Date(s) Administered  . Influenza Whole 10/24/2011  . Influenza-Unspecified 07/27/2013, 07/19/2014, 07/27/2015  . PPD Test 06/19/2013  . Pneumococcal Conjugate-13 01/17/2015  . Zoster 05/05/2006   Pertinent  Health Maintenance Due  Topic Date Due  . PNA vac Low  Risk Adult (2 of 2 - PPSV23) 01/17/2016  . INFLUENZA VACCINE  05/14/2016  . DEXA SCAN  Completed   Fall Risk  08/17/2014  Falls in the past year? No  Risk for fall due to : History of fall(s)  Risk for fall due to (comments): 09-24-2013 broke right shoulder   Functional Status Survey: Is the patient deaf or have difficulty hearing?: No Does the patient have difficulty seeing, even when wearing glasses/contacts?: No Does the patient have difficulty concentrating, remembering, or making decisions?: Yes Does the patient have difficulty walking or climbing stairs?: Yes Does the patient have difficulty dressing or bathing?: Yes Does the patient have difficulty doing errands alone such as visiting a doctor's office or shopping?: Yes  Filed Vitals:   12/05/15 1632  BP: 112/50  Pulse: 55  Temp: 98.2 F (36.8 C)  Resp: 20  Height:  (1.549 m)  Weight: 170 lb (77.111 kg)  SpO2: 94%   Body mass index is 32.14 kg/(m^2). Physical Exam  Constitutional: She appears well-developed and well-nourished. No distress.  Cardiovascular: Normal rate, regular rhythm, normal heart sounds and intact distal pulses.   Pulmonary/Chest: Effort normal and breath sounds normal. No respiratory distress.  Abdominal: Soft. Bowel sounds are normal. She exhibits no distension. There is no  tenderness.  Musculoskeletal: Normal range of motion.  Ambulates with her walker  Neurological: She is alert.  Skin: Skin is warm and dry.  Psychiatric:  Pleasant and verbal    Labs reviewed:  Recent Labs  09/02/15  NA 141  K 4.2  BUN 18  CREATININE 0.7   No results for input(s): AST, ALT, ALKPHOS, BILITOT, PROT, ALBUMIN in the last 8760 hours.  Recent Labs  09/02/15  WBC 7.6  HGB 13.6  HCT 42  PLT 328   Lab Results  Component Value Date   TSH 0.35* 09/02/2015   Lab Results  Component Value Date   HGBA1C 6.0* 07/27/2013   Lab Results  Component Value Date   CHOL 211* 09/02/2015   HDL 47 09/02/2015   LDLCALC 136 09/02/2015   TRIG 140 09/02/2015    Assessment/Plan 1. Essential hypertension -bp controlled at present, no longer on medications for this  2. Slow transit constipation -continues on bowel regimen with success including miralax only  3. Other specified hypothyroidism -cont current synthroid--I believe there is another TSH since the one above, if not, needs recheck  4. Dementia with behavioral disturbance -namely she has delusions and sometimes gets upset when reality is different -no significant changes recently -her daughter feels she's adapted well -continues on aricept and namenda (regular) due to cost  5. Senile osteoporosis -remains on prolia which is meant to be for a 2 year course -first dose was 12/20/13 (has had 4 doses) -cont ca with D and additional D, participating in weightbearing exercises here and walking with walker -bone density was not covered after heel scan was done--unclear how long I will have to wait to order the real test again (hopefully not two full years) so will continue prolia course for now -prior auth should be done at facility by staff as she lives in healthcare (not done at [facility] office)  Family/ staff Communication: discussed with [employer] staff  Labs/tests ordered:  Needs TSH if has not been repeated since Nov  (suspect just not abstracted)  Norris Bodley L. Anisten Tomassi, D.O. Geriatrics [facility] [address], [place] [address] Cell Phone (Mon-Fri 8am-5pm):  [phone] On Call:  [phone] & follow prompts after 5pm & weekends Office Phone:  [phone] Office Fax:  [phone]

## 2015-12-26 ENCOUNTER — Telehealth: Payer: Self-pay

## 2015-12-26 NOTE — Telephone Encounter (Signed)
Message left on triage voicemail: Patient needs PA for Prolia, papers were faxed last week -? If we received.  I called Suzette back and requested that papers be refaxed.

## 2016-01-07 NOTE — Telephone Encounter (Signed)
Pt now lives in memory care so prior auth for prolia should be done by [facility]

## 2016-01-17 ENCOUNTER — Ambulatory Visit: Payer: Self-pay

## 2016-02-01 ENCOUNTER — Non-Acute Institutional Stay: Payer: Medicare Other | Admitting: Adult Health

## 2016-02-01 ENCOUNTER — Encounter: Payer: Self-pay | Admitting: Adult Health

## 2016-02-01 DIAGNOSIS — K5901 Slow transit constipation: Secondary | ICD-10-CM | POA: Diagnosis not present

## 2016-02-01 DIAGNOSIS — E038 Other specified hypothyroidism: Secondary | ICD-10-CM

## 2016-02-01 DIAGNOSIS — F0391 Unspecified dementia with behavioral disturbance: Secondary | ICD-10-CM

## 2016-02-01 DIAGNOSIS — F03918 Unspecified dementia, unspecified severity, with other behavioral disturbance: Secondary | ICD-10-CM

## 2016-02-01 DIAGNOSIS — I10 Essential (primary) hypertension: Secondary | ICD-10-CM | POA: Diagnosis not present

## 2016-02-01 NOTE — Progress Notes (Signed)
Patient ID: [name], female   DOB: 16-Sep-1927, 82 y.o.   MRN: [id number]   Location:  [facility]   Place of Service:  ALF (13) Provider:   Peggye Ley, ANP [facility] [phone]   REED, Elmarie Shiley, DO  Patient Care Team: Kermit Balo, DO as PCP - General (Geriatric Medicine) [facility] Kerrin Champagne, MD as Consulting Physician (Orthopedic Surgery) Archer Asa, MD as Consulting Physician (Psychiatry)  Extended Emergency Contact Information Primary Emergency Contact: [name],[name] Address: [phone]          [place] [address] [place] Home Phone: [phone] Mobile Phone: [phone] Relation: None  Code Status:  DNR Goals of care: Advanced Directive information Advanced Directives 02/01/2016  Does patient have an advance directive? Yes  Type of Estate agent of [place];Living will;Out of facility DNR (pink MOST or yellow form)  Copy of advanced directive(s) in chart? Yes  Pre-existing out of facility DNR order (yellow form or pink MOST form) Yellow form placed in chart (order not valid for inpatient use)     Chief Complaint  Patient presents with  . Medical Management of Chronic Issues    HPI:  Pt is a 82 y.o. female seen today for medical management of chronic diseases.    1. Dementia with behavioral disturbance -worsening agitation with resistance to care and periods of delusions -no fever but has had a cough for 3 days with congestion  2. Essential hypertension -controlled no cp, sob, or edema  3. Slow transit constipation -regular BMS per staff with senna s and miralax  4. Other specified hypothyroidism -TSH borderline low -currently on synthroid  5. Cough -noted for 3 days without fever or purulent sputum -sats 88-90% with no distress  Past Medical History  Diagnosis Date  . Unspecified hypothyroidism 06/05/2005  . Unspecified  vitamin D deficiency 02/03/2012  . Other malaise and fatigue 10/09/2009  . Other and unspecified hyperlipidemia 07/10/2009  . Backache, unspecified 04/10/2009  . Unspecified hearing loss 12/05/2008  . Herpes zoster with other nervous system complications(053.19) 09/12/2008  . Unspecified essential hypertension 11/02/2007  . Rash and other nonspecific skin eruption 11/02/2007  . Shortness of breath 11/02/2007  . Senile osteoporosis 07/09/2006  . Cervicalgia 09/12/2005  . Memory deficit   . Unspecified constipation   . Delusion (HCC) 06/04/2013  . Closed fracture of unspecified part of upper end of humerus 09/28/2013    Right proximal humerus fracture 09/24/2013   Past Surgical History  Procedure Laterality Date  . Back surgery  2006    Dr. Lequita Halt  . Total knee arthroplasty Left 09/15/2002  . Laminectomy  03/20/2005    Centtal with Bil L3 & L4 nerve root decompression  Dr. Otelia Sergeant  . Total knee arthroplasty  2000    Dr. Jerl Santos  . Spine surgery  1991    Dr. Otelia Sergeant  . Spine surgery  1996    Dr. Salli Real  . Kidney stone surgery  1959    Dr. Elige Radon  . Cataract extraction, bilateral      Allergies  Allergen Reactions  . Codeine   . Darvocet [Propoxyphene N-Acetaminophen]   . Erythromycin   . Hydantoins   . Lyrica [Pregabalin]   . Morphine And Related   . Penicillins   . Percocet [Oxycodone-Acetaminophen]   . Quaternium-15   . Sulfa Antibiotics   . Sulfites   . Tobramycin-Dexamethasone       Medication List  This list is accurate as of: 02/01/16 11:51 AM.  Always use your most recent med list.               acetaminophen 325 MG tablet  Commonly known as:  TYLENOL  Take 650 mg by mouth 3 (three) times daily.     CALCIUM 600+D3 600-800 MG-UNIT Tabs  Generic drug:  Calcium Carb-Cholecalciferol  Take by mouth. Take twice daily     CENTRUM SILVER PO  Take one tablet once daily     Cholecalciferol 2000 units Caps  Take 1 capsule by mouth.     denosumab 60 MG/ML Soln  injection  Commonly known as:  PROLIA  Inject 60 mg into the skin once. Administer in upper arm, thigh, or abdomen     desvenlafaxine 50 MG 24 hr tablet  Commonly known as:  PRISTIQ  Take 50 mg by mouth. Take one tablet daily for depression     donepezil 10 MG disintegrating tablet  Commonly known as:  ARICEPT ODT  Take 1 tablet (10 mg total) by mouth at bedtime.     ibuprofen 400 MG tablet  Commonly known as:  ADVIL,MOTRIN  Take 400 mg by mouth. Take one tablet 2-3 times daily as needed for pain     levothyroxine 100 MCG tablet  Commonly known as:  SYNTHROID, LEVOTHROID  Take 100 mcg by mouth daily before breakfast.     LORazepam 0.5 MG tablet  Commonly known as:  ATIVAN  Take 0.5 mg by mouth every 8 (eight) hours as needed for anxiety.     memantine 10 MG tablet  Commonly known as:  NAMENDA  Take 10 mg by mouth 2 (two) times daily. For dementia     polyethylene glycol powder powder  Commonly known as:  GLYCOLAX/MIRALAX  Take 17 g by mouth daily.     QUEtiapine 50 MG tablet  Commonly known as:  SEROQUEL  Take 50 mg by mouth 2 (two) times daily.     sennosides-docusate sodium 8.6-50 MG tablet  Commonly known as:  SENOKOT-S  Take 2 tablets by mouth at bedtime.        Review of Systems  Unable to perform ROS: Dementia    Immunization History  Administered Date(s) Administered  . Influenza Whole 10/24/2011  . Influenza-Unspecified 07/27/2013, 07/19/2014, 07/27/2015  . PPD Test 06/19/2013  . Pneumococcal Conjugate-13 01/17/2015  . Zoster 05/05/2006   Pertinent  Health Maintenance Due  Topic Date Due  . PNA vac Low Risk Adult (2 of 2 - PPSV23) 01/17/2016  . INFLUENZA VACCINE  05/14/2016  . DEXA SCAN  Completed   Fall Risk  08/17/2014  Falls in the past year? No  Risk for fall due to : History of fall(s)  Risk for fall due to (comments): 09-24-2013 broke right shoulder   Functional Status Survey:   98.4 56 16 88-90% on RA 122/78 Filed Vitals:   02/01/16  1137  Weight: 164 lb (74.39 kg)   Body mass index is 31 kg/(m^2). Physical Exam  Constitutional: No distress.  HENT:  Head: Normocephalic and atraumatic.  Eyes: Conjunctivae are normal. Pupils are equal, round, and reactive to light. Right eye exhibits no discharge. Left eye exhibits no discharge.  Neck: Normal range of motion. Neck supple. No JVD present. No thyromegaly present.  Cardiovascular: Normal rate and regular rhythm.   No murmur heard. No edema  Pulmonary/Chest: Effort normal. She has wheezes (through out). She has rales.  Abdominal: Soft. Bowel sounds are normal.  She exhibits no distension. There is no tenderness.  Lymphadenopathy:    She has no cervical adenopathy.  Neurological: She is alert.  No obvious deficit but would not follow commands consistently. Alert to self only.  Agitated at times  Skin: Skin is warm and dry. She is not diaphoretic.    Labs reviewed:  Recent Labs  09/02/15  NA 141  K 4.2  BUN 18  CREATININE 0.7   No results for input(s): AST, ALT, ALKPHOS, BILITOT, PROT, ALBUMIN in the last 8760 hours.  Recent Labs  09/02/15  WBC 7.6  HGB 13.6  HCT 42  PLT 328   Lab Results  Component Value Date   TSH 0.35* 09/02/2015   Lab Results  Component Value Date   HGBA1C 6.0* 07/27/2013   Lab Results  Component Value Date   CHOL 211* 09/02/2015   HDL 47 09/02/2015   LDLCALC 136 09/02/2015   TRIG 140 09/02/2015    Significant Diagnostic Results in last 30 days:  No results found.  Assessment/Plan  1. Dementia with behavioral disturbance -worsening agitation this week, may be due to dementia but has also had a cough  -continue seroquel and namenda  2. Essential hypertension -controlled  -not currently on meds -BMP ok  3. Slow transit constipation -improved with senna s -continue this as well as miralax  4. Other specified hypothyroidism -stable with borderline low TSH -continue synthroid  5.Cough -noted for 3 days with  adventitious lung sounds and decreased 02 sats (no distress) -due to delirium obtain CXR to rule out pna -may have duoneb q 6 hr prn cough/sob -02 as needed per facility protocol -VS qshift   Family/ staff Communication: discussed with staff  Labs/tests ordered:  CXR  Peggye Leyhristy Kinsley Nicklaus, ANP [facility] [phone]

## 2016-03-07 DIAGNOSIS — Z79899 Other long term (current) drug therapy: Secondary | ICD-10-CM | POA: Diagnosis not present

## 2016-03-07 LAB — HEMOGLOBIN A1C: Hemoglobin A1C: 5.6

## 2016-03-19 ENCOUNTER — Non-Acute Institutional Stay (SKILLED_NURSING_FACILITY): Payer: Medicare Other | Admitting: Internal Medicine

## 2016-03-19 DIAGNOSIS — F0391 Unspecified dementia with behavioral disturbance: Secondary | ICD-10-CM | POA: Diagnosis not present

## 2016-03-19 DIAGNOSIS — M810 Age-related osteoporosis without current pathological fracture: Secondary | ICD-10-CM | POA: Diagnosis not present

## 2016-03-19 DIAGNOSIS — I10 Essential (primary) hypertension: Secondary | ICD-10-CM

## 2016-03-19 DIAGNOSIS — E038 Other specified hypothyroidism: Secondary | ICD-10-CM

## 2016-03-19 DIAGNOSIS — K5901 Slow transit constipation: Secondary | ICD-10-CM | POA: Diagnosis not present

## 2016-03-19 DIAGNOSIS — F22 Delusional disorders: Secondary | ICD-10-CM | POA: Diagnosis not present

## 2016-03-19 DIAGNOSIS — F03918 Unspecified dementia, unspecified severity, with other behavioral disturbance: Secondary | ICD-10-CM

## 2016-03-19 NOTE — Progress Notes (Signed)
Patient ID: [name], female   DOB: [birth year], 82 y.o.   MRN: [id number]  Location:  [facility] Room Number: 307 Place of Service:  SNF (31) Provider:  Falen Lehrmann L. Renato Gailseed, D.O., C.M.D.  Bufford SpikesEED, Antuane Eastridge, DO  Patient Care Team: Kermit Baloiffany L Pavel Gadd, DO as PCP - General (Geriatric Medicine) [facility] Kerrin ChampagneJames E Nitka, MD as Consulting Physician (Orthopedic Surgery) Archer AsaGerald Plovsky, MD as Consulting Physician (Psychiatry)  Extended Emergency Contact Information Primary Emergency Contact: [name],[name] Address: [phone]          [place] [id number] [place] Home Phone: [phone] Mobile Phone: [phone] Relation: None  Code Status:  DNR Goals of care: Advanced Directive information Advanced Directives 03/19/2016  Does patient have an advance directive? -  Type of Advance Directive Out of facility DNR (pink MOST or yellow form);Healthcare Power of [place];Living will  Copy of advanced directive(s) in chart? Yes  Pre-existing out of facility DNR order (yellow form or pink MOST form) Yellow form placed in chart (order not valid for inpatient use)   Chief Complaint  Patient presents with  . Medical Management of Chronic Issues    routine visit    HPI:  Pt is a 82 y.o. female seen today for medical management of chronic diseases.  Staff report no new concerns.  She continues to talk to herself and likely hallucinate.  She is on seroquel 50mg .    For her dementia, she remains on namenda and aricept.  It is advanced and she requires help with adls except feeds herself.  In April, she was treated for pneumonia with levaquin and florastor.  Otherwise, she is doing well.    She is on synthroid for her hypothyroidism.   Lab Results  Component Value Date   TSH 0.35* 09/02/2015  Needs repeat TSH.  She is on pristiq for depression.    For osteoporosis, she takes vitamin D and ca with D plus prolia twice  a year.  She ambulates with a walker.  Past Medical History  Diagnosis Date  . Unspecified hypothyroidism 06/05/2005  . Unspecified vitamin D deficiency 02/03/2012  . Other malaise and fatigue 10/09/2009  . Other and unspecified hyperlipidemia 07/10/2009  . Backache, unspecified 04/10/2009  . Unspecified hearing loss 12/05/2008  . Herpes zoster with other nervous system complications(053.19) 09/12/2008  . Unspecified essential hypertension 11/02/2007  . Rash and other nonspecific skin eruption 11/02/2007  . Shortness of breath 11/02/2007  . Senile osteoporosis 07/09/2006  . Cervicalgia 09/12/2005  . Memory deficit   . Unspecified constipation   . Delusion (HCC) 06/04/2013  . Closed fracture of unspecified part of upper end of humerus 09/28/2013    Right proximal humerus fracture 09/24/2013   Past Surgical History  Procedure Laterality Date  . Back surgery  2006    Dr. Lequita HaltAluisio  . Total knee arthroplasty Left 09/15/2002  . Laminectomy  03/20/2005    Centtal with Bil L3 & L4 nerve root decompression  Dr. Otelia SergeantNitka  . Total knee arthroplasty  2000    Dr. Jerl Santosalldorf  . Spine surgery  1991    Dr. Otelia SergeantNitka  . Spine surgery  1996    Dr. Salli RealNitak  . Kidney stone surgery  1959    Dr. Elige RadonBradley  . Cataract extraction, bilateral      Allergies  Allergen Reactions  . Codeine   . Darvocet [Propoxyphene N-Acetaminophen]   . Erythromycin   . Hydantoins   . Lyrica [Pregabalin]   .  Morphine And Related   . Penicillins   . Percocet [Oxycodone-Acetaminophen]   . Quaternium-15   . Sulfa Antibiotics   . Sulfites   . Tobramycin-Dexamethasone       Medication List       This list is accurate as of: 03/19/16  3:18 PM.  Always use your most recent med list.               acetaminophen 325 MG tablet  Commonly known as:  TYLENOL  Take 650 mg by mouth 3 (three) times daily.     CALCIUM 600+D3 600-800 MG-UNIT Tabs  Generic drug:  Calcium Carb-Cholecalciferol  Take by mouth. Take twice daily      CENTRUM SILVER PO  Take one tablet once daily     Cholecalciferol 2000 units Caps  Take 1 capsule by mouth.     denosumab 60 MG/ML Soln injection  Commonly known as:  PROLIA  Inject 60 mg into the skin once. Administer in upper arm, thigh, or abdomen     desvenlafaxine 50 MG 24 hr tablet  Commonly known as:  PRISTIQ  Take 50 mg by mouth. Take one tablet daily for depression     donepezil 10 MG disintegrating tablet  Commonly known as:  ARICEPT ODT  Take 1 tablet (10 mg total) by mouth at bedtime.     ibuprofen 400 MG tablet  Commonly known as:  ADVIL,MOTRIN  Take 400 mg by mouth. Take one tablet 2-3 times daily as needed for pain     levothyroxine 100 MCG tablet  Commonly known as:  SYNTHROID, LEVOTHROID  Take 100 mcg by mouth daily before breakfast.     LORazepam 0.5 MG tablet  Commonly known as:  ATIVAN  Take 0.5 mg by mouth every 8 (eight) hours as needed for anxiety.     memantine 10 MG tablet  Commonly known as:  NAMENDA  Take 10 mg by mouth 2 (two) times daily. For dementia     polyethylene glycol powder powder  Commonly known as:  GLYCOLAX/MIRALAX  Take 17 g by mouth daily.     QUEtiapine 50 MG tablet  Commonly known as:  SEROQUEL  Take 50 mg by mouth 2 (two) times daily.     sennosides-docusate sodium 8.6-50 MG tablet  Commonly known as:  SENOKOT-S  Take 2 tablets by mouth at bedtime.        Review of Systems  Unable to perform ROS: Dementia  see hpi for what was obtained from nursing staff  Immunization History  Administered Date(s) Administered  . Influenza Whole 10/24/2011  . Influenza-Unspecified 07/27/2013, 07/19/2014, 07/27/2015  . PPD Test 06/19/2013  . Pneumococcal Conjugate-13 01/17/2015  . Zoster 05/05/2006   Pertinent  Health Maintenance Due  Topic Date Due  . PNA vac Low Risk Adult (2 of 2 - PPSV23) 01/17/2016  . INFLUENZA VACCINE  05/14/2016  . DEXA SCAN  Completed   Fall Risk  08/17/2014  Falls in the past year? No  Risk for  fall due to : History of fall(s)  Risk for fall due to (comments): 09-24-2013 broke right shoulder    Filed Vitals:   03/19/16 1503  BP: 129/64  Pulse: 59  Temp: 99.2 F (37.3 C)  TempSrc: Oral  Resp: 18  SpO2: 93%   There is no weight on file to calculate BMI. Physical Exam  Constitutional: No distress.  Eyes:  glasses  Cardiovascular: Normal rate, regular rhythm, normal heart sounds and intact distal pulses.  Pulmonary/Chest: Effort normal and breath sounds normal.  Abdominal: Soft. Bowel sounds are normal.  Musculoskeletal: Normal range of motion.  Walks with walker  Neurological: She is alert.  Skin: Skin is warm and dry.  Psychiatric:  Talking to herself, staring off     Labs reviewed:  Recent Labs  09/02/15  NA 141  K 4.2  BUN 18  CREATININE 0.7   No results for input(s): AST, ALT, ALKPHOS, BILITOT, PROT, ALBUMIN in the last 8760 hours.  Recent Labs  09/02/15  WBC 7.6  HGB 13.6  HCT 42  PLT 328   Lab Results  Component Value Date   TSH 0.35* 09/02/2015   Lab Results  Component Value Date   HGBA1C 5.6 03/07/2016   Lab Results  Component Value Date   CHOL 211* 09/02/2015   HDL 47 09/02/2015   LDLCALC 136 09/02/2015   TRIG 140 09/02/2015    Assessment/Plan 1. Dementia with behavioral disturbance -gradually progessive -continues on her namenda and aricept complicated by hallucinations and delusions for which she takes seroquel--they continue anyway  2. Delusion (HCC) -see #1  3. Senile osteoporosis -continues on prolia, ca with D and additional D and should only take prolia for 2 years   4. Other specified hypothyroidism -cont current levothyroxine--needs repeat TSH since last one last year was abnormal   5. Essential hypertension -bp controlled w/o meds  6. Slow transit constipation -bowels moving with use of miralax daily  Family/ staff Communication: discussed with memory care nursing  Labs/tests ordered:  TSH next  draw

## 2016-04-19 ENCOUNTER — Non-Acute Institutional Stay: Payer: Medicare Other | Admitting: Adult Health

## 2016-04-19 ENCOUNTER — Encounter: Payer: Self-pay | Admitting: Internal Medicine

## 2016-04-19 DIAGNOSIS — F0391 Unspecified dementia with behavioral disturbance: Secondary | ICD-10-CM | POA: Diagnosis not present

## 2016-04-19 DIAGNOSIS — K602 Anal fissure, unspecified: Secondary | ICD-10-CM

## 2016-04-19 DIAGNOSIS — K629 Disease of anus and rectum, unspecified: Secondary | ICD-10-CM | POA: Diagnosis not present

## 2016-04-19 DIAGNOSIS — F03918 Unspecified dementia, unspecified severity, with other behavioral disturbance: Secondary | ICD-10-CM

## 2016-04-19 NOTE — Progress Notes (Signed)
Patient ID: [name], female   DOB: Feb 22, 1927, 82 y.o.   MRN: [id number]   Location:   [facility]   Place of Service:  ALF (13) Provider:   Peggye Ley, ANP [facility] [phone]   Bufford Spikes, DO  Patient Care Team: Kermit Balo, DO as PCP - General (Geriatric Medicine) [facility] Kerrin Champagne, MD as Consulting Physician (Orthopedic Surgery) Archer Asa, MD as Consulting Physician (Psychiatry)  Extended Emergency Contact Information Primary Emergency Contact: [name],[name] Address: [phone]          [place] [address] [place] Home Phone: [phone] Mobile Phone: [phone] Relation: None  Code Status:  DNR Goals of care: Advanced Directive information Advanced Directives 03/19/2016  Does patient have an advance directive? -  Type of Advance Directive Out of facility DNR (pink MOST or yellow form);Healthcare Power of [place];Living will  Copy of advanced directive(s) in chart? Yes  Pre-existing out of facility DNR order (yellow form or pink MOST form) Yellow form placed in chart (order not valid for inpatient use)     Chief Complaint  Patient presents with  . Acute Visit    skin issues on rectum    HPI:  Pt is a 82 y.o. female seen today for an acute visit for an opening to her the skin around the rectum.  She has a hx of constipation and takes miralax and senokot. Staff is not able to say how often she has BM's as she takes herself and has dementia. She has had issues with paranoia and refusing personal care. The staff is not able to give her a proper bath on a regular basis due to behavior. For our visit she is asked to come to her room but refuses. Later another CNA convinces her to come to her room but when she enters the room she grabs a wire hanger off the bed and threatens me with it. We aborted the exam at that point.  Luckily the staff had looked at her rectum earlier and took a  picture so I was able to view the area of concern.   Past Medical History  Diagnosis Date  . Unspecified hypothyroidism 06/05/2005  . Unspecified vitamin D deficiency 02/03/2012  . Other malaise and fatigue 10/09/2009  . Other and unspecified hyperlipidemia 07/10/2009  . Backache, unspecified 04/10/2009  . Unspecified hearing loss 12/05/2008  . Herpes zoster with other nervous system complications(053.19) 09/12/2008  . Unspecified essential hypertension 11/02/2007  . Rash and other nonspecific skin eruption 11/02/2007  . Shortness of breath 11/02/2007  . Senile osteoporosis 07/09/2006  . Cervicalgia 09/12/2005  . Memory deficit   . Unspecified constipation   . Delusion (HCC) 06/04/2013  . Closed fracture of unspecified part of upper end of humerus 09/28/2013    Right proximal humerus fracture 09/24/2013   Past Surgical History  Procedure Laterality Date  . Back surgery  2006    Dr. Lequita Halt  . Total knee arthroplasty Left 09/15/2002  . Laminectomy  03/20/2005    Centtal with Bil L3 & L4 nerve root decompression  Dr. Otelia Sergeant  . Total knee arthroplasty  2000    Dr. Jerl Santos  . Spine surgery  1991    Dr. Otelia Sergeant  . Spine surgery  1996    Dr. Salli Real  . Kidney stone surgery  1959    Dr. Elige Radon  . Cataract extraction, bilateral      Allergies  Allergen Reactions  .  Codeine   . Darvocet [Propoxyphene N-Acetaminophen]   . Erythromycin   . Hydantoins   . Lyrica [Pregabalin]   . Morphine And Related   . Penicillins   . Percocet [Oxycodone-Acetaminophen]   . Quaternium-15   . Sulfa Antibiotics   . Sulfites   . Tobramycin-Dexamethasone       Medication List       This list is accurate as of: 04/19/16 11:59 PM.  Always use your most recent med list.               acetaminophen 325 MG tablet  Commonly known as:  TYLENOL  Take 650 mg by mouth 3 (three) times daily.     CALCIUM 600+D3 600-800 MG-UNIT Tabs  Generic drug:  Calcium Carb-Cholecalciferol  Take by mouth. Take twice  daily     CENTRUM SILVER PO  Take one tablet once daily     Cholecalciferol 2000 units Caps  Take 1 capsule by mouth.     denosumab 60 MG/ML Soln injection  Commonly known as:  PROLIA  Inject 60 mg into the skin once. Administer in upper arm, thigh, or abdomen     desvenlafaxine 50 MG 24 hr tablet  Commonly known as:  PRISTIQ  Take 50 mg by mouth. Take one tablet daily for depression     donepezil 10 MG disintegrating tablet  Commonly known as:  ARICEPT ODT  Take 1 tablet (10 mg total) by mouth at bedtime.     ibuprofen 400 MG tablet  Commonly known as:  ADVIL,MOTRIN  Take 400 mg by mouth. Take one tablet 2-3 times daily as needed for pain     levothyroxine 100 MCG tablet  Commonly known as:  SYNTHROID, LEVOTHROID  Take 100 mcg by mouth daily before breakfast.     LORazepam 0.5 MG tablet  Commonly known as:  ATIVAN  Take 0.5 mg by mouth every 8 (eight) hours as needed for anxiety.     memantine 10 MG tablet  Commonly known as:  NAMENDA  Take 10 mg by mouth 2 (two) times daily. For dementia     polyethylene glycol powder powder  Commonly known as:  GLYCOLAX/MIRALAX  Take 17 g by mouth daily.     QUEtiapine 50 MG tablet  Commonly known as:  SEROQUEL  Take 50 mg by mouth 2 (two) times daily.     sennosides-docusate sodium 8.6-50 MG tablet  Commonly known as:  SENOKOT-S  Take 2 tablets by mouth at bedtime.        Review of Systems  Unable to perform ROS: Dementia    Immunization History  Administered Date(s) Administered  . Influenza Whole 10/24/2011  . Influenza-Unspecified 07/27/2013, 07/19/2014, 07/27/2015  . PPD Test 06/19/2013  . Pneumococcal Conjugate-13 01/17/2015  . Zoster 05/05/2006   Pertinent  Health Maintenance Due  Topic Date Due  . PNA vac Low Risk Adult (2 of 2 - PPSV23) 01/17/2016  . INFLUENZA VACCINE  05/14/2016  . DEXA SCAN  Completed   Fall Risk  08/17/2014  Falls in the past year? No  Risk for fall due to : History of fall(s)    Risk for fall due to (comments): 09-24-2013 broke right shoulder   Functional Status Survey:    Filed Vitals:   04/24/16 1506  BP: 128/74  Pulse: 58  Temp: 96.6 F (35.9 C)  Resp: 18  SpO2: 91%   There is no weight on file to calculate BMI.  Refuses further exam and was combative  Physical Exam  Genitourinary:  Erythema to the rectal area with rectal fissure noted, fairly superficial with no drainage  Neurological: She is alert.  Psychiatric:  agitated    Labs reviewed:  Recent Labs  09/02/15  NA 141  K 4.2  BUN 18  CREATININE 0.7   No results for input(s): AST, ALT, ALKPHOS, BILITOT, PROT, ALBUMIN in the last 8760 hours.  Recent Labs  09/02/15  WBC 7.6  HGB 13.6  HCT 42  PLT 328   Lab Results  Component Value Date   TSH 0.35* 09/02/2015   Lab Results  Component Value Date   HGBA1C 5.6 03/07/2016   Lab Results  Component Value Date   CHOL 211* 09/02/2015   HDL 47 09/02/2015   LDLCALC 136 09/02/2015   TRIG 140 09/02/2015    Significant Diagnostic Results in last 30 days:  No results found.  Assessment/Plan  1) Rectal fissure -anusol hc cream qhs for 7 days -Increase senokot s to two tabs BID -if no improvement may try nitro cream -dulcolax supp today  2) Dementia with behavioral disturbance -Ativan 0.5 mg qam for 3 days due to agitation, aggressive behavior and inability to provide personal care -monitor behavior and report  -consider depakote    Peggye Leyhristy Li Fragoso, ANP [facility] [phone]

## 2016-04-24 ENCOUNTER — Encounter: Payer: Self-pay | Admitting: Adult Health

## 2016-04-26 ENCOUNTER — Non-Acute Institutional Stay: Payer: Medicare Other | Admitting: Adult Health

## 2016-04-26 DIAGNOSIS — K629 Disease of anus and rectum, unspecified: Secondary | ICD-10-CM | POA: Diagnosis not present

## 2016-04-26 DIAGNOSIS — F0391 Unspecified dementia with behavioral disturbance: Secondary | ICD-10-CM | POA: Diagnosis not present

## 2016-04-26 DIAGNOSIS — K602 Anal fissure, unspecified: Secondary | ICD-10-CM

## 2016-04-26 DIAGNOSIS — F03918 Unspecified dementia, unspecified severity, with other behavioral disturbance: Secondary | ICD-10-CM

## 2016-04-26 NOTE — Progress Notes (Signed)
Patient ID: [name], female   DOB: 1927-09-06, 82 y.o.   MRN: [id number]     Location:   [facility]   Place of Service:  ALF (13) Provider:   Peggye Ley, ANP [facility] [phone]   Bufford Spikes, DO  Patient Care Team: Kermit Balo, DO as PCP - General (Geriatric Medicine) [facility] Kerrin Champagne, MD as Consulting Physician (Orthopedic Surgery) Archer Asa, MD as Consulting Physician (Psychiatry)  Extended Emergency Contact Information Primary Emergency Contact: [name],[name] Address: [phone]          [place] [address] [place] Home Phone: [phone] Mobile Phone: [phone] Relation: None  Code Status:  DNR Goals of care: Advanced Directive information Advanced Directives 03/19/2016  Does patient have an advance directive? -  Type of Advance Directive Out of facility DNR ([place] MOST or yellow form);Healthcare Power of [place];Living will  Copy of advanced directive(s) in chart? Yes  Pre-existing out of facility DNR order (yellow form or Ward MOST form) Yellow form placed in chart (order not valid for inpatient use)     Chief Complaint  Patient presents with  . Acute Visit    f/u rectal fissure and behavioral issues    HPI:  Pt is a 82 y.o. female seen today for an acute visit for follow up regarding a rectal fissure.  She has a hx of constipation and takes miralax and senokot. Staff is not able to say how often she has BM's as she takes herself and has dementia. She has had issues with paranoia and refusing personal care. The staff is not able to give her a proper bath on a regular basis due to behavior.  She is pleasant for the visit today, but was combative for our last visit and threatened me with a coat hanger.  Resident can not provide a hx. Staff reports she is eating and drinking as usual and in her normal state of health.    Past Medical History  Diagnosis Date  .  Unspecified hypothyroidism 06/05/2005  . Unspecified vitamin D deficiency 02/03/2012  . Other malaise and fatigue 10/09/2009  . Other and unspecified hyperlipidemia 07/10/2009  . Backache, unspecified 04/10/2009  . Unspecified hearing loss 12/05/2008  . Herpes zoster with other nervous system complications(053.19) 09/12/2008  . Unspecified essential hypertension 11/02/2007  . Rash and other nonspecific skin eruption 11/02/2007  . Shortness of breath 11/02/2007  . Senile osteoporosis 07/09/2006  . Cervicalgia 09/12/2005  . Memory deficit   . Unspecified constipation   . Delusion (HCC) 06/04/2013  . Closed fracture of unspecified part of upper end of humerus 09/28/2013    Right proximal humerus fracture 09/24/2013   Past Surgical History  Procedure Laterality Date  . Back surgery  2006    Dr. Lequita Halt  . Total knee arthroplasty Left 09/15/2002  . Laminectomy  03/20/2005    Centtal with Bil L3 & L4 nerve root decompression  Dr. Otelia Sergeant  . Total knee arthroplasty  2000    Dr. Jerl Santos  . Spine surgery  1991    Dr. Otelia Sergeant  . Spine surgery  1996    Dr. Salli Real  . Kidney stone surgery  1959    Dr. Elige Radon  . Cataract extraction, bilateral      Allergies  Allergen Reactions  . Codeine   . Darvocet [Propoxyphene N-Acetaminophen]   . Erythromycin   . Hydantoins   . Lyrica [Pregabalin]   . Morphine And Related   .  Penicillins   . Percocet [Oxycodone-Acetaminophen]   . Quaternium-15   . Sulfa Antibiotics   . Sulfites   . Tobramycin-Dexamethasone       Medication List       This list is accurate as of: 04/26/16 11:59 PM.  Always use your most recent med list.               acetaminophen 325 MG tablet  Commonly known as:  TYLENOL  Take 650 mg by mouth 3 (three) times daily.     CALCIUM 600+D3 600-800 MG-UNIT Tabs  Generic drug:  Calcium Carb-Cholecalciferol  Take by mouth. Take twice daily     CENTRUM SILVER PO  Take one tablet once daily     Cholecalciferol 2000 units Caps    Take 1 capsule by mouth.     denosumab 60 MG/ML Soln injection  Commonly known as:  PROLIA  Inject 60 mg into the skin once. Administer in upper arm, thigh, or abdomen     desvenlafaxine 50 MG 24 hr tablet  Commonly known as:  PRISTIQ  Take 50 mg by mouth. Take one tablet daily for depression     donepezil 10 MG disintegrating tablet  Commonly known as:  ARICEPT ODT  Take 1 tablet (10 mg total) by mouth at bedtime.     ibuprofen 400 MG tablet  Commonly known as:  ADVIL,MOTRIN  Take 400 mg by mouth. Take one tablet 2-3 times daily as needed for pain     levothyroxine 100 MCG tablet  Commonly known as:  SYNTHROID, LEVOTHROID  Take 100 mcg by mouth daily before breakfast.     LORazepam 0.5 MG tablet  Commonly known as:  ATIVAN  Take 0.5 mg by mouth every 8 (eight) hours as needed for anxiety.     memantine 10 MG tablet  Commonly known as:  NAMENDA  Take 10 mg by mouth 2 (two) times daily. For dementia     polyethylene glycol powder powder  Commonly known as:  GLYCOLAX/MIRALAX  Take 17 g by mouth daily.     QUEtiapine 50 MG tablet  Commonly known as:  SEROQUEL  Take 50 mg by mouth 2 (two) times daily.     sennosides-docusate sodium 8.6-50 MG tablet  Commonly known as:  SENOKOT-S  Take 2 tablets by mouth 2 (two) times daily.        Review of Systems  Unable to perform ROS: Dementia    Immunization History  Administered Date(s) Administered  . Influenza Whole 10/24/2011  . Influenza-Unspecified 07/27/2013, 07/19/2014, 07/27/2015  . PPD Test 06/19/2013  . Pneumococcal Conjugate-13 01/17/2015  . Zoster 05/05/2006   Pertinent  Health Maintenance Due  Topic Date Due  . PNA vac Low Risk Adult (2 of 2 - PPSV23) 01/17/2016  . INFLUENZA VACCINE  05/14/2016  . DEXA SCAN  Completed   Fall Risk  08/17/2014  Falls in the past year? No  Risk for fall due to : History of fall(s)  Risk for fall due to (comments): 09-24-2013 broke right shoulder   Functional Status  Survey:    Filed Vitals:   05/01/16 0651  BP: 131/64  Pulse: 57  Temp: 97 F (36.1 C)  Resp: 20  Weight: 161 lb (73.029 kg)  SpO2: 90%   Body mass index is 30.44 kg/(m^2).   Physical Exam  Constitutional: No distress.  HENT:  Head: Normocephalic and atraumatic.  Eyes: Pupils are equal, round, and reactive to light.  Cardiovascular: Normal rate and regular  rhythm.   No murmur heard. Pulmonary/Chest: Effort normal and breath sounds normal.  Abdominal: Soft. Bowel sounds are normal. She exhibits no distension. There is no tenderness. There is no rebound and no guarding.  Genitourinary:  Erythema to rectal area with small linear opening adjacent to the rectum that is healing. No drainage or tenderness or swelling.  Neurological: She is alert.  Pleasant, oriented to self only, following commands  Skin: Skin is warm and dry. She is not diaphoretic. There is erythema.     Labs reviewed:  Recent Labs  09/02/15  NA 141  K 4.2  BUN 18  CREATININE 0.7   No results for input(s): AST, ALT, ALKPHOS, BILITOT, PROT, ALBUMIN in the last 8760 hours.  Recent Labs  09/02/15  WBC 7.6  HGB 13.6  HCT 42  PLT 328   Lab Results  Component Value Date   TSH 0.35* 09/02/2015   Lab Results  Component Value Date   HGBA1C 5.6 03/07/2016   Lab Results  Component Value Date   CHOL 211* 09/02/2015   HDL 47 09/02/2015   LDLCALC 136 09/02/2015   TRIG 140 09/02/2015    Significant Diagnostic Results in last 30 days:  No results found.  Assessment/Plan  1) Rectal fissure Improved but not resolved No signs of infection Lotrisone BID for 10 days Continue senokot and miralax to prevent constipation   2) Dementia with behavioral disturbance Ativan did not seem to help  Increase seroquel to 75 mg BID Check CBC, BMP for underlying issues, although I expect her behavioral issues are dementia related  Peggye Ley, ANP [facility] [phone]

## 2016-05-30 ENCOUNTER — Non-Acute Institutional Stay: Payer: Medicare Other | Admitting: Adult Health

## 2016-05-30 ENCOUNTER — Encounter: Payer: Self-pay | Admitting: Adult Health

## 2016-05-30 DIAGNOSIS — G8929 Other chronic pain: Secondary | ICD-10-CM | POA: Diagnosis not present

## 2016-05-30 DIAGNOSIS — E038 Other specified hypothyroidism: Secondary | ICD-10-CM

## 2016-05-30 DIAGNOSIS — K5901 Slow transit constipation: Secondary | ICD-10-CM | POA: Diagnosis not present

## 2016-05-30 DIAGNOSIS — I10 Essential (primary) hypertension: Secondary | ICD-10-CM

## 2016-05-30 DIAGNOSIS — F0391 Unspecified dementia with behavioral disturbance: Secondary | ICD-10-CM | POA: Diagnosis not present

## 2016-05-30 DIAGNOSIS — F03918 Unspecified dementia, unspecified severity, with other behavioral disturbance: Secondary | ICD-10-CM

## 2016-05-30 DIAGNOSIS — R52 Pain, unspecified: Secondary | ICD-10-CM

## 2016-05-30 LAB — CBC AND DIFFERENTIAL
HCT: 41 % (ref 36–46)
HEMOGLOBIN: 12.4 g/dL (ref 12.0–16.0)
Platelets: 295 10*3/uL (ref 150–399)
WBC: 6.2 10^3/mL

## 2016-05-30 LAB — BASIC METABOLIC PANEL
BUN: 15 mg/dL (ref 4–21)
Creatinine: 0.7 mg/dL (ref 0.5–1.1)
Glucose: 82 mg/dL
POTASSIUM: 4.5 mmol/L (ref 3.4–5.3)
SODIUM: 141 mmol/L (ref 137–147)

## 2016-05-30 NOTE — Progress Notes (Addendum)
Patient ID: [name], female   DOB: January 13, 1927, 82 y.o.   MRN: [id number]  Location:   [facility]   Place of Service:  ALF (13) Provider:   Peggye Ley, ANP [facility] [phone]   Bufford Spikes, DO  Patient Care Team: Kermit Balo, DO as PCP - General (Geriatric Medicine) [facility] Kerrin Champagne, MD as Consulting Physician (Orthopedic Surgery) Archer Asa, MD as Consulting Physician (Psychiatry)  Extended Emergency Contact Information Primary Emergency Contact: [name],[name] Address: [phone]          [place] [address] [place] Home Phone: [phone] Mobile Phone: [phone] Relation: None  Code Status:  DNR Goals of care: Advanced Directive information Advanced Directives 05/30/2016  Does patient have an advance directive? Yes  Type of Estate agent of [place];Living will;Out of facility DNR (pink MOST or yellow form)  Copy of advanced directive(s) in chart? Yes  Pre-existing out of facility DNR order (yellow form or pink MOST form) -     Chief Complaint  Patient presents with  . Medical Management of Chronic Issues    HPI:  Pt is a 82 y.o. female seen today for medical management of chronic diseases.  Resides in memory care due to advanced dementia. Remains ambulatory but needs assistance with ADL's.  Last MMSE 15/30 in Sept of 2016.  Staff reports occasional episodes of crying episodes and periods of agitation.  She states she is looking for her husband but can't find him.  In July her seroquel was increased to 75 mg BID for this reason.  The nurse reports these episodes are infrequent and don't respond to ativan. She was treated for a rectal fissure last month and this has resolved. Normal BM's.  Weight stable at 161 lbs.  She has a hx of osteoarthritis, cervicalgia, right shoulder pain, and low back pain s/p laminectomy lumbar laminectomy. She is currently on  scheduled tylenol TID and denies pain.     Past Medical History:  Diagnosis Date  . Backache, unspecified 04/10/2009  . Cervicalgia 09/12/2005  . Closed fracture of unspecified part of upper end of humerus 09/28/2013   Right proximal humerus fracture 09/24/2013  . Delusion (HCC) 06/04/2013  . Herpes zoster with other nervous system complications(053.19) 09/12/2008  . Memory deficit   . Other and unspecified hyperlipidemia 07/10/2009  . Other malaise and fatigue 10/09/2009  . Rash and other nonspecific skin eruption 11/02/2007  . Senile osteoporosis 07/09/2006  . Shortness of breath 11/02/2007  . Unspecified constipation   . Unspecified essential hypertension 11/02/2007  . Unspecified hearing loss 12/05/2008  . Unspecified hypothyroidism 06/05/2005  . Unspecified vitamin D deficiency 02/03/2012   Past Surgical History:  Procedure Laterality Date  . BACK SURGERY  2006   Dr. Lequita Halt  . CATARACT EXTRACTION, BILATERAL    . KIDNEY STONE SURGERY  1959   Dr. Elige Radon  . LAMINECTOMY  03/20/2005   Centtal with Bil L3 & L4 nerve root decompression  Dr. Otelia Sergeant  . SPINE SURGERY  1991   Dr. Otelia Sergeant  . SPINE SURGERY  1996   Dr. Salli Real  . TOTAL KNEE ARTHROPLASTY Left 09/15/2002  . TOTAL KNEE ARTHROPLASTY  2000   Dr. Jerl Santos    Allergies  Allergen Reactions  . Codeine   . Darvocet [Propoxyphene N-Acetaminophen]   . Erythromycin   . Hydantoins   . Lyrica [Pregabalin]   . Morphine And Related   . Penicillins   .  Percocet [Oxycodone-Acetaminophen]   . Quaternium-15   . Sulfa Antibiotics   . Sulfites   . Tobramycin-Dexamethasone       Medication List       Accurate as of 05/30/16 12:14 PM. Always use your most recent med list.          acetaminophen 325 MG tablet Commonly known as:  TYLENOL Take 650 mg by mouth 3 (three) times daily.   CALCIUM 600+D3 600-800 MG-UNIT Tabs Generic drug:  Calcium Carb-Cholecalciferol Take by mouth. Take twice daily   carbamide peroxide 6.5 % otic  solution Commonly known as:  DEBROX Place 4 drops into both ears once a week.   CENTRUM SILVER PO Take one tablet once daily   Cholecalciferol 2000 units Caps Take 1 capsule by mouth.   denosumab 60 MG/ML Soln injection Commonly known as:  PROLIA Inject 60 mg into the skin once. Administer in upper arm, thigh, or abdomen   desvenlafaxine 50 MG 24 hr tablet Commonly known as:  PRISTIQ Take 50 mg by mouth. Take one tablet daily for depression   donepezil 10 MG disintegrating tablet Commonly known as:  ARICEPT ODT Take 1 tablet (10 mg total) by mouth at bedtime.   ibuprofen 400 MG tablet Commonly known as:  ADVIL,MOTRIN Take 400 mg by mouth. Take one tablet 2-3 times daily as needed for pain   levothyroxine 100 MCG tablet Commonly known as:  SYNTHROID, LEVOTHROID Take 100 mcg by mouth daily before breakfast.   LORazepam 0.5 MG tablet Commonly known as:  ATIVAN Take 0.5 mg by mouth every 8 (eight) hours as needed for anxiety.   memantine 10 MG tablet Commonly known as:  NAMENDA Take 10 mg by mouth 2 (two) times daily. For dementia   polyethylene glycol powder powder Commonly known as:  GLYCOLAX/MIRALAX Take 17 g by mouth daily.   QUEtiapine 50 MG tablet Commonly known as:  SEROQUEL Take 75 mg by mouth 2 (two) times daily.   sennosides-docusate sodium 8.6-50 MG tablet Commonly known as:  SENOKOT-S Take 2 tablets by mouth 2 (two) times daily.       Review of Systems  Unable to perform ROS: Dementia    Immunization History  Administered Date(s) Administered  . Influenza Whole 10/24/2011  . Influenza-Unspecified 07/27/2013, 07/19/2014, 07/27/2015  . PPD Test 06/19/2013  . Pneumococcal Conjugate-13 01/17/2015  . Zoster 05/05/2006   Pertinent  Health Maintenance Due  Topic Date Due  . PNA vac Low Risk Adult (2 of 2 - PPSV23) 01/17/2016  . INFLUENZA VACCINE  05/14/2016  . DEXA SCAN  Completed   Fall Risk  08/17/2014  Falls in the past year? No  Risk for  fall due to : History of fall(s)  Risk for fall due to (comments): 09-24-2013 broke right shoulder   Functional Status Survey:   Wt Readings from Last 3 Encounters:  05/30/16 161 lb (73 kg)  05/01/16 161 lb (73 kg)  02/01/16 164 lb (74.4 kg)   Vitals:   05/30/16 1212  BP: (!) 134/53  Pulse: 62  Resp: 18  Temp: 97.1 F (36.2 C)  SpO2: 92%  Weight: 161 lb (73 kg)   There is no height or weight on file to calculate BMI. Physical Exam  Constitutional: No distress.  HENT:  Head: Normocephalic and atraumatic.  Neck: No JVD present.  Cardiovascular: Normal rate and regular rhythm.   No murmur heard. Pulmonary/Chest: Effort normal and breath sounds normal. No respiratory distress. She has no wheezes.  Musculoskeletal:  Normal range of motion. She exhibits no edema or tenderness.  Neurological: She is alert.  Oriented to self only  Skin: Skin is warm and dry. She is not diaphoretic.  Psychiatric:  Anxious to find her husband, tearful at times    Labs reviewed:  Recent Labs  09/02/15 05/30/16  NA 141 141  K 4.2 4.5  BUN 18 15  CREATININE 0.7 0.7   No results for input(s): AST, ALT, ALKPHOS, BILITOT, PROT, ALBUMIN in the last 8760 hours.  Recent Labs  09/02/15 05/30/16  WBC 7.6 6.2  HGB 13.6 12.4  HCT 42 41  PLT 328 295   Lab Results  Component Value Date   TSH 0.35 (A) 09/02/2015   Lab Results  Component Value Date   HGBA1C 5.6 03/07/2016   Lab Results  Component Value Date   CHOL 211 (A) 09/02/2015   HDL 47 09/02/2015   LDLCALC 136 09/02/2015   TRIG 140 09/02/2015    Significant Diagnostic Results in last 30 days:  No results found.  Assessment/Plan 1. Dementia with behavioral disturbance Advanced with periods of agitation Continue namenda and aricept IF behaviors worsens would consider depakote vs. Changing her SSRI  2. Essential hypertension Controlled without meds  3. Chronic generalized pain No reports of pain Continue Tylenol 650 mg  TID  4. Other specified hypothyroidism Continue Synthroid 100 mcg daily  5. Slow transit constipation Improved Continue Senokot s 2 tabs BID and Miralax 17 gm daily    Family/ staff Communication: discussed with staff  Labs/tests ordered:  NA

## 2016-06-28 ENCOUNTER — Non-Acute Institutional Stay: Payer: Medicare Other | Admitting: Adult Health

## 2016-06-28 DIAGNOSIS — F0391 Unspecified dementia with behavioral disturbance: Secondary | ICD-10-CM | POA: Diagnosis not present

## 2016-06-28 DIAGNOSIS — F03918 Unspecified dementia, unspecified severity, with other behavioral disturbance: Secondary | ICD-10-CM

## 2016-06-28 DIAGNOSIS — M810 Age-related osteoporosis without current pathological fracture: Secondary | ICD-10-CM

## 2016-06-28 NOTE — Progress Notes (Signed)
Patient ID: [name], female   DOB: 01/27/1927, 82 y.o.   MRN: [id number]  Location:   [facility]   Place of Service:  ALF (13) Provider:   Peggye Ley, ANP [facility] [phone]   Bufford Spikes, DO  Patient Care Team: Kermit Balo, DO as PCP - General (Geriatric Medicine) [facility] Kerrin Champagne, MD as Consulting Physician (Orthopedic Surgery) Archer Asa, MD as Consulting Physician (Psychiatry)  Extended Emergency Contact Information Primary Emergency Contact: [name],[name] Address: [phone]          [place] [address] [place] Home Phone: [phone] Mobile Phone: [phone] Relation: None  Code Status:  DNR Goals of care: Advanced Directive information Advanced Directives 05/30/2016  Does patient have an advance directive? Yes  Type of Estate agent of [place];Living will;Out of facility DNR (pink MOST or yellow form)  Copy of advanced directive(s) in chart? Yes  Pre-existing out of facility DNR order (yellow form or pink MOST form) -     Chief Complaint  Patient presents with  . Acute Visit    behaviors    HPI:  Pt is a 82 y.o. female seen today for increased agitation and behavioral disturbance. She resides in memory care due to advanced dementia. Staff reports that pt was sobbing yesterday and stated that she wanted to die and wanted to talk to her mother. She has frequently attempted to leave the unit and successfully eloped on 8/13 and 9/9. She has been taking prn Ativan 0.5 mg every 8 hrs and was recently placed on scheduled Ativan bid on 9/10. Staff has given her prn Ativan every other day. Staff reports improvement in behavior with Ativan. She is taking Pristiq 50 mg. In July her Seroquel was increased to 75 mg BID. This morning she is pleasant and has no complaints. She states that her mother works here, but is busy and she is not able to see her.  She has a  hx of osteoarthritis, cervicalgia, right shoulder pain, and low back pain s/p laminectomy lumbar laminectomy.Staff reports her recent refusal of taking large Calcium tablet. However, she will chew a Tums.    Past Medical History:  Diagnosis Date  . Backache, unspecified 04/10/2009  . Cervicalgia 09/12/2005  . Closed fracture of unspecified part of upper end of humerus 09/28/2013   Right proximal humerus fracture 09/24/2013  . Delusion (HCC) 06/04/2013  . Herpes zoster with other nervous system complications(053.19) 09/12/2008  . Memory deficit   . Other and unspecified hyperlipidemia 07/10/2009  . Other malaise and fatigue 10/09/2009  . Rash and other nonspecific skin eruption 11/02/2007  . Senile osteoporosis 07/09/2006  . Shortness of breath 11/02/2007  . Unspecified constipation   . Unspecified essential hypertension 11/02/2007  . Unspecified hearing loss 12/05/2008  . Unspecified hypothyroidism 06/05/2005  . Unspecified vitamin D deficiency 02/03/2012   Past Surgical History:  Procedure Laterality Date  . BACK SURGERY  2006   Dr. Lequita Halt  . CATARACT EXTRACTION, BILATERAL    . KIDNEY STONE SURGERY  1959   Dr. Elige Radon  . LAMINECTOMY  03/20/2005   Centtal with Bil L3 & L4 nerve root decompression  Dr. Otelia Sergeant  . SPINE SURGERY  1991   Dr. Otelia Sergeant  . SPINE SURGERY  1996   Dr. Salli Real  . TOTAL KNEE ARTHROPLASTY Left 09/15/2002  . TOTAL KNEE ARTHROPLASTY  2000   Dr. Jerl Santos    Allergies  Allergen Reactions  .  Codeine   . Darvocet [Propoxyphene N-Acetaminophen]   . Erythromycin   . Hydantoins   . Lyrica [Pregabalin]   . Morphine And Related   . Penicillins   . Percocet [Oxycodone-Acetaminophen]   . Quaternium-15   . Sulfa Antibiotics   . Sulfites   . Tobramycin-Dexamethasone       Medication List       Accurate as of 06/28/16 10:32 AM. Always use your most recent med list.          acetaminophen 325 MG tablet Commonly known as:  TYLENOL Take 650 mg by mouth 3 (three)  times daily.   carbamide peroxide 6.5 % otic solution Commonly known as:  DEBROX Place 4 drops into both ears once a week.   Cholecalciferol 2000 units Caps Take 1 capsule by mouth.   denosumab 60 MG/ML Soln injection Commonly known as:  PROLIA Inject 60 mg into the skin once. Administer in upper arm, thigh, or abdomen   desvenlafaxine 50 MG 24 hr tablet Commonly known as:  PRISTIQ Take 50 mg by mouth. Take one tablet daily for depression   donepezil 10 MG disintegrating tablet Commonly known as:  ARICEPT ODT Take 1 tablet (10 mg total) by mouth at bedtime.   levothyroxine 100 MCG tablet Commonly known as:  SYNTHROID, LEVOTHROID Take 100 mcg by mouth daily before breakfast.   LORazepam 0.5 MG tablet Commonly known as:  ATIVAN Take 0.5 mg by mouth every 8 (eight) hours as needed for anxiety.   memantine 10 MG tablet Commonly known as:  NAMENDA Take 10 mg by mouth 2 (two) times daily. For dementia   polyethylene glycol powder powder Commonly known as:  GLYCOLAX/MIRALAX Take 17 g by mouth daily.   QUEtiapine 50 MG tablet Commonly known as:  SEROQUEL Take 75 mg by mouth 2 (two) times daily.   sennosides-docusate sodium 8.6-50 MG tablet Commonly known as:  SENOKOT-S Take 2 tablets by mouth 2 (two) times daily.       Review of Systems  Unable to perform ROS: Dementia    Immunization History  Administered Date(s) Administered  . Influenza Whole 10/24/2011  . Influenza-Unspecified 07/27/2013, 07/19/2014, 07/27/2015  . PPD Test 06/19/2013  . Pneumococcal Conjugate-13 01/17/2015  . Zoster 05/05/2006   Pertinent  Health Maintenance Due  Topic Date Due  . PNA vac Low Risk Adult (2 of 2 - PPSV23) 01/17/2016  . INFLUENZA VACCINE  05/14/2016  . DEXA SCAN  Completed   Fall Risk  08/17/2014  Falls in the past year? No  Risk for fall due to : History of fall(s)  Risk for fall due to (comments): 09-24-2013 broke right shoulder   Functional Status Survey:   Wt  Readings from Last 3 Encounters:  05/30/16 161 lb (73 kg)  05/01/16 161 lb (73 kg)  02/01/16 164 lb (74.4 kg)   Vitals:   06/28/16 1010  Pulse: 63  Resp: 18  Temp: 97.1 F (36.2 C)  SpO2: 90%  Weight: 158 lb (71.7 kg)   There is no height or weight on file to calculate BMI. Physical Exam  Constitutional: No distress.  HENT:  Head: Normocephalic and atraumatic.  Mouth/Throat: Oropharynx is clear and moist.  Eyes: Pupils are equal, round, and reactive to light.  Wears glasses  Neck: Normal range of motion. Neck supple.  Cardiovascular: Normal rate and regular rhythm.  Exam reveals no friction rub.   No murmur heard. Pulmonary/Chest: Effort normal and breath sounds normal. No respiratory distress. She has  no wheezes.  Abdominal: Soft. Bowel sounds are normal. She exhibits no distension. There is no tenderness.  Musculoskeletal: Normal range of motion. She exhibits no edema or tenderness.  Lymphadenopathy:    She has no cervical adenopathy.  Neurological: She is alert.  Oriented to self only  Skin: Skin is warm and dry. She is not diaphoretic.  Psychiatric:  Reports her mother works here   Nursing note and vitals reviewed.   Labs reviewed:  Recent Labs  09/02/15 05/30/16  NA 141 141  K 4.2 4.5  BUN 18 15  CREATININE 0.7 0.7   No results for input(s): AST, ALT, ALKPHOS, BILITOT, PROT, ALBUMIN in the last 8760 hours.  Recent Labs  09/02/15 05/30/16  WBC 7.6 6.2  HGB 13.6 12.4  HCT 42 41  PLT 328 295   Lab Results  Component Value Date   TSH 0.35 (A) 09/02/2015   Lab Results  Component Value Date   HGBA1C 5.6 03/07/2016   Lab Results  Component Value Date   CHOL 211 (A) 09/02/2015   HDL 47 09/02/2015   LDLCALC 136 09/02/2015   TRIG 140 09/02/2015    Significant Diagnostic Results in last 30 days:  No results found.  Assessment/Plan  1. Dementia with behavioral disturbance Advanced with periods of agitation and delusion Continue namenda and  aricept and seroquel D/C scheduled Ativan. Continue Ativan 0.5mg  PO prn every 8 hrs. Start Depakote 125 mg PO liquid bid CBC, CMP, Depakote level in one month She does not appear to be a danger to herself or others at this time  2. Senile Osteoporosis Continue Prolia as scheduled D/C Calcium tablets due to pt refusing medication Start Tums 500 mg chewable tablets bid     Family/ staff Communication: discussed with staff  Labs/tests ordered:  NA

## 2016-07-26 DIAGNOSIS — Z79899 Other long term (current) drug therapy: Secondary | ICD-10-CM | POA: Diagnosis not present

## 2016-07-26 DIAGNOSIS — R4182 Altered mental status, unspecified: Secondary | ICD-10-CM | POA: Diagnosis not present

## 2016-07-26 DIAGNOSIS — R7989 Other specified abnormal findings of blood chemistry: Secondary | ICD-10-CM | POA: Diagnosis not present

## 2016-07-26 LAB — BASIC METABOLIC PANEL
BUN: 17 mg/dL (ref 4–21)
Creatinine: 0.9 mg/dL (ref 0.5–1.1)
Glucose: 105 mg/dL
Potassium: 4.7 mmol/L (ref 3.4–5.3)
Sodium: 142 mmol/L (ref 137–147)

## 2016-07-26 LAB — HEPATIC FUNCTION PANEL
ALK PHOS: 76 U/L (ref 25–125)
ALT: 10 U/L (ref 7–35)
AST: 15 U/L (ref 13–35)

## 2016-07-26 LAB — CBC AND DIFFERENTIAL
HEMATOCRIT: 46 % (ref 36–46)
HEMOGLOBIN: 14 g/dL (ref 12.0–16.0)
PLATELETS: 296 10*3/uL (ref 150–399)
WBC: 6.7 10^3/mL

## 2016-07-29 ENCOUNTER — Encounter: Payer: Self-pay | Admitting: Adult Health

## 2016-07-29 ENCOUNTER — Non-Acute Institutional Stay: Payer: Medicare Other | Admitting: Adult Health

## 2016-07-29 DIAGNOSIS — I10 Essential (primary) hypertension: Secondary | ICD-10-CM | POA: Diagnosis not present

## 2016-07-29 DIAGNOSIS — K5901 Slow transit constipation: Secondary | ICD-10-CM

## 2016-07-29 DIAGNOSIS — E038 Other specified hypothyroidism: Secondary | ICD-10-CM

## 2016-07-29 DIAGNOSIS — M810 Age-related osteoporosis without current pathological fracture: Secondary | ICD-10-CM | POA: Diagnosis not present

## 2016-07-29 DIAGNOSIS — F0391 Unspecified dementia with behavioral disturbance: Secondary | ICD-10-CM | POA: Diagnosis not present

## 2016-07-29 NOTE — Progress Notes (Addendum)
Patient ID: [name], female   DOB: 1926-11-12, 82 y.o.   MRN: [id number]  Location:   [facility]   Place of Service:  SNF (31) Provider:   Peggye Ley, ANP [facility] [phone]   Bufford Spikes, DO  Patient Care Team: Kermit Balo, DO as PCP - General (Geriatric Medicine) [facility] Kerrin Champagne, MD as Consulting Physician (Orthopedic Surgery) Archer Asa, MD as Consulting Physician (Psychiatry)  Extended Emergency Contact Information Primary Emergency Contact: [name],[name] Address: [phone]          [place] [address] [place] Home Phone: [phone] Mobile Phone: [phone] Relation: None  Code Status:  DNR Goals of care: Advanced Directive information Advanced Directives 05/30/2016  Does patient have an advance directive? Yes  Type of Estate agent of [place];Living will;Out of facility DNR (pink MOST or yellow form)  Copy of advanced directive(s) in chart? Yes  Pre-existing out of facility DNR order (yellow form or pink MOST form) -     Chief Complaint  Patient presents with  . Medical Management of Chronic Issues    HPI:  Pt is a 82 y.o. female seen today for medical management of chronic diseases.  Resides in memory care due to advanced dementia. Remains ambulatory but needs assistance with ADL's.  1. Dementia with behavioral disturbance, unspecified dementia type Her behavioral disturbances have been progressing. She was started on Depakote in 9/17. The dose has been increased and she still has outbursts of behavioral problems. Staff reports that they have needed to use prn Ativan on a daily basis. She continues to go to door in an attempt to elope. She reports that her mother works at [facility] and she wants to go home with her mother.  She is also on Prestique, Seroquel, Namenda and Aricept. Her last MMSE was 20/30 on 02/2016. She had difficulty with  orientation, serial 7s, and recall.   2. Essential hypertension Her BP has been stable. She is not taking any anti-hypertensives.   3. Other specified hypothyroidism Her last TSH was 0.35 on 09/02/15. She is taking synthroid.   4. Slow transit constipation Her BMs have been normal. She is taking Miralax and Senokot.   5. Senile osteoporosis She is taking Prolia q 6 months. She was unable to take calcium tablets and was switched to Tums. She is taking Vit D. She had a Dexa scan on her heel on 08/2015. T-score was -1.8. She is unable to go to an outside facility due to her advanced dementia in order to have a more thorough Dexa-scan.    Past Medical History:  Diagnosis Date  . Backache, unspecified 04/10/2009  . Cervicalgia 09/12/2005  . Closed fracture of unspecified part of upper end of humerus 09/28/2013   Right proximal humerus fracture 09/24/2013  . Delusion (HCC) 06/04/2013  . Herpes zoster with other nervous system complications(053.19) 09/12/2008  . Memory deficit   . Other and unspecified hyperlipidemia 07/10/2009  . Other malaise and fatigue 10/09/2009  . Rash and other nonspecific skin eruption 11/02/2007  . Senile osteoporosis 07/09/2006  . Shortness of breath 11/02/2007  . Unspecified constipation   . Unspecified essential hypertension 11/02/2007  . Unspecified hearing loss 12/05/2008  . Unspecified hypothyroidism 06/05/2005  . Unspecified vitamin D deficiency 02/03/2012   Past Surgical History:  Procedure Laterality Date  . BACK SURGERY  2006   Dr. Lequita Halt  . CATARACT EXTRACTION, BILATERAL    . KIDNEY  STONE SURGERY  1959   Dr. Elige Radon  . LAMINECTOMY  03/20/2005   Centtal with Bil L3 & L4 nerve root decompression  Dr. Otelia Sergeant  . SPINE SURGERY  1991   Dr. Otelia Sergeant  . SPINE SURGERY  1996   Dr. Salli Real  . TOTAL KNEE ARTHROPLASTY Left 09/15/2002  . TOTAL KNEE ARTHROPLASTY  2000   Dr. Jerl Santos    Allergies  Allergen Reactions  . Codeine   . Darvocet [Propoxyphene  N-Acetaminophen]   . Erythromycin   . Hydantoins   . Lyrica [Pregabalin]   . Morphine And Related   . Penicillins   . Percocet [Oxycodone-Acetaminophen]   . Quaternium-15   . Sulfa Antibiotics   . Sulfites   . Tobramycin-Dexamethasone       Medication List       Accurate as of 07/29/16  3:56 PM. Always use your most recent med list.          acetaminophen 325 MG tablet Commonly known as:  TYLENOL Take 650 mg by mouth 3 (three) times daily.   calcium carbonate 500 MG chewable tablet Commonly known as:  TUMS - dosed in mg elemental calcium Chew 1 tablet by mouth 2 (two) times daily.   carbamide peroxide 6.5 % otic solution Commonly known as:  DEBROX Place 4 drops into both ears once a week.   denosumab 60 MG/ML Soln injection Commonly known as:  PROLIA Inject 60 mg into the skin once. Administer in upper arm, thigh, or abdomen   desvenlafaxine 50 MG 24 hr tablet Commonly known as:  PRISTIQ Take 50 mg by mouth. Take one tablet daily for depression   donepezil 10 MG disintegrating tablet Commonly known as:  ARICEPT ODT Take 1 tablet (10 mg total) by mouth at bedtime.   ipratropium-albuterol 0.5-2.5 (3) MG/3ML Soln Commonly known as:  DUONEB Take 3 mLs by nebulization every 6 (six) hours as needed (cough/SOB).   levothyroxine 100 MCG tablet Commonly known as:  SYNTHROID, LEVOTHROID Take 100 mcg by mouth daily before breakfast.   LORazepam 0.5 MG tablet Commonly known as:  ATIVAN Take 0.5 mg by mouth every 8 (eight) hours as needed for anxiety. Ativan gel 0.5 mg (if not taking PO) topically q 6 hrs prn anxiety, agitation   memantine 10 MG tablet Commonly known as:  NAMENDA Take 10 mg by mouth 2 (two) times daily. For dementia   polyethylene glycol powder powder Commonly known as:  GLYCOLAX/MIRALAX Take 17 g by mouth daily.   QUEtiapine 50 MG tablet Commonly known as:  SEROQUEL Take 75 mg by mouth 2 (two) times daily.   sennosides-docusate sodium 8.6-50  MG tablet Commonly known as:  SENOKOT-S Take 2 tablets by mouth 2 (two) times daily.   Valproate Sodium 250 MG/5ML Soln solution Commonly known as:  DEPAKENE Take 250 mg by mouth 2 (two) times daily.       Review of Systems  Unable to perform ROS: Dementia    Immunization History  Administered Date(s) Administered  . Influenza Whole 10/24/2011  . Influenza-Unspecified 07/27/2013, 07/19/2014, 07/27/2015  . PPD Test 06/19/2013  . Pneumococcal Conjugate-13 01/17/2015  . Zoster 05/05/2006   Pertinent  Health Maintenance Due  Topic Date Due  . PNA vac Low Risk Adult (2 of 2 - PPSV23) 01/17/2016  . INFLUENZA VACCINE  05/14/2016  . DEXA SCAN  Completed   Fall Risk  08/17/2014  Falls in the past year? No  Risk for fall due to : History of fall(s)  Risk for fall due to (comments): 09-24-2013 broke right shoulder   Functional Status Survey:   Wt Readings from Last 3 Encounters:  07/29/16 156 lb 8 oz (71 kg)  06/28/16 158 lb (71.7 kg)  05/30/16 161 lb (73 kg)   Vitals:   07/29/16 1522  BP: 120/67  Pulse: (!) 55  Resp: 20  Temp: (!) 96.7 F (35.9 C)  SpO2: 93%  Weight: 156 lb 8 oz (71 kg)   Filed Weights   07/29/16 1522  Weight: 156 lb 8 oz (71 kg)   Body mass index is 29.57 kg/m. Physical Exam  Constitutional: She appears well-developed and well-nourished. No distress.  HENT:  Head: Normocephalic and atraumatic.  Mouth/Throat: Oropharynx is clear and moist.  Eyes: EOM are normal. Pupils are equal, round, and reactive to light.  Neck: Normal range of motion. Neck supple. No JVD present.  Cardiovascular: Normal rate, regular rhythm and normal heart sounds.  Exam reveals no friction rub.   No murmur heard. Pulmonary/Chest: Effort normal and breath sounds normal. No respiratory distress. She has no wheezes.  Abdominal: Soft. Bowel sounds are normal. She exhibits no distension. There is no tenderness. There is no guarding.  Musculoskeletal: Normal range of motion.  She exhibits no edema or tenderness.  Lymphadenopathy:    She has no cervical adenopathy.  Neurological: She is alert.  Oriented to self only  Skin: Skin is warm and dry. No rash noted. She is not diaphoretic. No erythema.  Psychiatric:  She was cooperative during the exam. She reports that her mother works at the facility. She states she is ready to go home and be with her mother.   Nursing note and vitals reviewed.   Labs reviewed:  Recent Labs  09/02/15 05/30/16 07/26/16  NA 141 141 142  K 4.2 4.5 4.7  BUN 18 15 17   CREATININE 0.7 0.7 0.9    Recent Labs  07/26/16  AST 15  ALT 10  ALKPHOS 76    Recent Labs  09/02/15 05/30/16 07/26/16  WBC 7.6 6.2 6.7  HGB 13.6 12.4 14.0  HCT 42 41 46  PLT 328 295 296   Lab Results  Component Value Date   TSH 0.35 (A) 09/02/2015   Lab Results  Component Value Date   HGBA1C 5.6 03/07/2016   Lab Results  Component Value Date   CHOL 211 (A) 09/02/2015   HDL 47 09/02/2015   LDLCALC 136 09/02/2015   TRIG 140 09/02/2015  Valproic Acid 51 on 07/26/16  Significant Diagnostic Results in last 30 days:  No results found.  Assessment/Plan 1. Dementia with behavioral disturbance, unspecified dementia type -Advanced with periods of agitation and behavioral disturbances -Continue namenda and aricept  -Continue Ativan prn -Increase Depakote liquid to 375 mg bid  2. Essential hypertension Controlled without meds  3. Other specified hypothyroidism -Continue synthroid 100 mcg daily -TSH draw in AM (last 08/2015)  4. Slow transit constipation -Controlled -Continue Miralax and Senakot  5. Senile osteoporosis -stable -Continue scheduled Prolia -Continue Tums, Vit D  Family/ staff Communication: discussed with staff  Labs/tests ordered:  TSH in AM

## 2016-07-30 DIAGNOSIS — E018 Other iodine-deficiency related thyroid disorders and allied conditions: Secondary | ICD-10-CM | POA: Diagnosis not present

## 2016-07-30 LAB — TSH: TSH: 0.04 u[IU]/mL — AB (ref <=5.90)

## 2016-08-15 DIAGNOSIS — R7989 Other specified abnormal findings of blood chemistry: Secondary | ICD-10-CM | POA: Diagnosis not present

## 2016-08-15 LAB — BASIC METABOLIC PANEL
BUN: 24 mg/dL — AB (ref 4–21)
Creatinine: 0.8 mg/dL (ref 0.5–1.1)
Glucose: 94 mg/dL
Potassium: 4.3 mmol/L (ref 3.4–5.3)
Sodium: 144 mmol/L (ref 137–147)

## 2016-08-15 LAB — HEPATIC FUNCTION PANEL
ALT: 13 U/L (ref 7–35)
AST: 18 U/L (ref 13–35)
Alkaline Phosphatase: 74 U/L (ref 25–125)
Bilirubin, Total: 0.2 mg/dL

## 2016-08-15 LAB — CBC AND DIFFERENTIAL
HCT: 47 % — AB (ref 36–46)
Hemoglobin: 14.4 g/dL (ref 12.0–16.0)
Platelets: 221 10*3/uL (ref 150–399)
WBC: 8.2 10^3/mL

## 2016-10-01 ENCOUNTER — Non-Acute Institutional Stay: Payer: Medicare Other | Admitting: Internal Medicine

## 2016-10-01 ENCOUNTER — Encounter: Payer: Self-pay | Admitting: Internal Medicine

## 2016-10-01 ENCOUNTER — Telehealth: Payer: Self-pay | Admitting: *Deleted

## 2016-10-01 DIAGNOSIS — K5901 Slow transit constipation: Secondary | ICD-10-CM

## 2016-10-01 DIAGNOSIS — F0281 Dementia in other diseases classified elsewhere with behavioral disturbance: Secondary | ICD-10-CM | POA: Diagnosis not present

## 2016-10-01 DIAGNOSIS — M810 Age-related osteoporosis without current pathological fracture: Secondary | ICD-10-CM

## 2016-10-01 DIAGNOSIS — E038 Other specified hypothyroidism: Secondary | ICD-10-CM

## 2016-10-01 DIAGNOSIS — G301 Alzheimer's disease with late onset: Secondary | ICD-10-CM

## 2016-10-01 DIAGNOSIS — I10 Essential (primary) hypertension: Secondary | ICD-10-CM | POA: Diagnosis not present

## 2016-10-01 DIAGNOSIS — Z23 Encounter for immunization: Secondary | ICD-10-CM | POA: Diagnosis not present

## 2016-10-01 DIAGNOSIS — F02818 Dementia in other diseases classified elsewhere, unspecified severity, with other behavioral disturbance: Secondary | ICD-10-CM

## 2016-10-01 NOTE — Telephone Encounter (Addendum)
Patient was given Prolia by [facility] staff, injection [facility]2016 [facility], should be given next May 2018.

## 2016-10-01 NOTE — Progress Notes (Signed)
Patient ID: [name], female   DOB: Mar 06, 1927, 82 y.o.   MRN: [id number]  Location:  [facility] Room Number: 307 Memory Care Place of Service:  SNF (31) Provider:  Nanie Dunkleberger L. Renato Gailseed, D.O., C.M.D.  Bufford SpikesEED, Jaquavius Hudler, DO  Patient Care Team: Kermit Baloiffany L Brantlee Penn, DO as PCP - General (Geriatric Medicine) [facility] Kerrin ChampagneJames E Nitka, MD as Consulting Physician (Orthopedic Surgery) Archer AsaGerald Plovsky, MD as Consulting Physician (Psychiatry)  Extended Emergency Contact Information Primary Emergency Contact: [name],[name] Address: [phone]          [place] [id number] [place] Home Phone: [phone] Mobile Phone: [phone] Relation: None  Code Status:  DNR Goals of care: Advanced Directive information Advanced Directives 10/01/2016  Does Patient Have a Medical Advance Directive? Yes  Type of Advance Directive Out of facility DNR (pink MOST or yellow form);Healthcare Power of [place] Station[place];Living will  Copy of Healthcare Power of Attorney in Chart? Yes  Pre-existing out of facility DNR order (yellow form or pink MOST form) Yellow form placed in chart (order not valid for inpatient use)     Chief Complaint  Patient presents with  . Medical Management of Chronic Issues    Routine Visit    HPI:  Pt is a 82 y.o. female seen today for medical management of chronic diseases.    Staff report no new concerns with her.  She was to have a TSH f/u in 6 wks which is apparently still scheduled for future.  She needs her pneumovax.   She scored 20/30 on her MMSE 5/17.    Not sure what happened with her prolia, but will reinitiate paperwork for it.  Past Medical History:  Diagnosis Date  . Backache, unspecified 04/10/2009  . Cervicalgia 09/12/2005  . Closed fracture of unspecified part of upper end of humerus 09/28/2013   Right proximal humerus fracture 09/24/2013  . Delusion (HCC) 06/04/2013  . Herpes zoster  with other nervous system complications(053.19) 09/12/2008  . Memory deficit   . Other and unspecified hyperlipidemia 07/10/2009  . Other malaise and fatigue 10/09/2009  . Rash and other nonspecific skin eruption 11/02/2007  . Senile osteoporosis 07/09/2006  . Shortness of breath 11/02/2007  . Unspecified constipation   . Unspecified essential hypertension 11/02/2007  . Unspecified hearing loss 12/05/2008  . Unspecified hypothyroidism 06/05/2005  . Unspecified vitamin D deficiency 02/03/2012   Past Surgical History:  Procedure Laterality Date  . BACK SURGERY  2006   Dr. Lequita HaltAluisio  . CATARACT EXTRACTION, BILATERAL    . KIDNEY STONE SURGERY  1959   Dr. Elige RadonBradley  . LAMINECTOMY  03/20/2005   Centtal with Bil L3 & L4 nerve root decompression  Dr. Otelia SergeantNitka  . SPINE SURGERY  1991   Dr. Otelia SergeantNitka  . SPINE SURGERY  1996   Dr. Salli RealNitak  . TOTAL KNEE ARTHROPLASTY Left 09/15/2002  . TOTAL KNEE ARTHROPLASTY  2000   Dr. Jerl Santosalldorf    Allergies  Allergen Reactions  . Codeine   . Darvocet [Propoxyphene N-Acetaminophen]   . Erythromycin   . Hydantoins   . Lyrica [Pregabalin]   . Morphine And Related   . Penicillins   . Percocet [Oxycodone-Acetaminophen]   . Quaternium-15   . Sulfa Antibiotics   . Sulfites   . Tobramycin-Dexamethasone     Allergies as of 10/01/2016      Reactions   Codeine    Darvocet [propoxyphene N-acetaminophen]    Erythromycin    Hydantoins  Lyrica [pregabalin]    Morphine And Related    Penicillins    Percocet [oxycodone-acetaminophen]    Quaternium-15    Sulfa Antibiotics    Sulfites    Tobramycin-dexamethasone       Medication List       Accurate as of 10/01/16  2:56 PM. Always use your most recent med list.          acetaminophen 325 MG tablet Commonly known as:  TYLENOL Take 650 mg by mouth 3 (three) times daily.   calcium carbonate 500 MG chewable tablet Commonly known as:  TUMS - dosed in mg elemental calcium Chew 1 tablet by mouth 2 (two) times daily.    carbamide peroxide 6.5 % otic solution Commonly known as:  DEBROX Place 4 drops into both ears once a week.   denosumab 60 MG/ML Soln injection Commonly known as:  PROLIA Inject 60 mg into the skin once. Administer in upper arm, thigh, or abdomen   desvenlafaxine 50 MG 24 hr tablet Commonly known as:  PRISTIQ Take 50 mg by mouth. Take one tablet daily for depression   donepezil 10 MG disintegrating tablet Commonly known as:  ARICEPT ODT Take 1 tablet (10 mg total) by mouth at bedtime.   levothyroxine 100 MCG tablet Commonly known as:  SYNTHROID, LEVOTHROID Take 100 mcg by mouth daily before breakfast.   LORazepam 0.5 MG tablet Commonly known as:  ATIVAN Take 0.5 mg by mouth every 8 (eight) hours as needed for anxiety. Ativan gel 0.5 mg (if not taking PO) topically q 6 hrs prn anxiety, agitation   memantine 10 MG tablet Commonly known as:  NAMENDA Take 10 mg by mouth 2 (two) times daily. For dementia   polyethylene glycol powder powder Commonly known as:  GLYCOLAX/MIRALAX Take 17 g by mouth daily.   QUEtiapine 50 MG tablet Commonly known as:  SEROQUEL Take 75 mg by mouth 2 (two) times daily.   sennosides-docusate sodium 8.6-50 MG tablet Commonly known as:  SENOKOT-S Take 2 tablets by mouth 2 (two) times daily.   Valproate Sodium 250 MG/5ML Soln solution Commonly known as:  DEPAKENE Take 250 mg by mouth 2 (two) times daily.       Review of Systems  Constitutional: Negative for activity change, appetite change, chills and fever.  Eyes: Negative for visual disturbance.  Respiratory: Negative for chest tightness and shortness of breath.   Cardiovascular: Negative for chest pain and leg swelling.  Gastrointestinal: Positive for constipation. Negative for abdominal distention, blood in stool, diarrhea, nausea, rectal pain and vomiting.  Genitourinary: Negative for dysuria.  Musculoskeletal: Negative for arthralgias.  Skin: Positive for pallor.  Neurological:  Negative for dizziness and weakness.  Hematological: Bruises/bleeds easily.  Psychiatric/Behavioral: Positive for confusion.    Immunization History  Administered Date(s) Administered  . Influenza Inj Mdck Quad Pf 08/01/2016  . Influenza Whole 10/24/2011  . Influenza-Unspecified 07/27/2013, 07/19/2014, 07/27/2015  . PPD Test 06/19/2013  . Pneumococcal Conjugate-13 01/17/2015  . Zoster 05/05/2006   Pertinent  Health Maintenance Due  Topic Date Due  . PNA vac Low Risk Adult (2 of 2 - PPSV23) 01/17/2016  . INFLUENZA VACCINE  Completed  . DEXA SCAN  Completed   Fall Risk  08/17/2014  Falls in the past year? No  Risk for fall due to : History of fall(s)  Risk for fall due to (comments): 09-24-2013 broke right shoulder   Functional Status Survey:    Vitals:   10/01/16 1430  BP: 116/63  Pulse: Marland Kitchen(!)  57  Resp: 20  Temp: 97 F (36.1 C)  TempSrc: Oral  SpO2: 90%   There is no height or weight on file to calculate BMI. Physical Exam  Constitutional: She appears well-developed and well-nourished. No distress.  Cardiovascular: Normal rate, regular rhythm, normal heart sounds and intact distal pulses.   Pulmonary/Chest: Effort normal and breath sounds normal. No respiratory distress.  Abdominal: Soft. Bowel sounds are normal. She exhibits no distension and no mass. There is no tenderness. There is no rebound and no guarding.  Musculoskeletal: Normal range of motion.  Neurological: She is alert.  Skin: Skin is warm and dry. There is pallor.  Psychiatric: She has a normal mood and affect.    Labs reviewed:  Recent Labs  05/30/16 07/26/16 08/15/16 0248  NA 141 142 144  K 4.5 4.7 4.3  BUN 15 17 24*  CREATININE 0.7 0.9 0.8    Recent Labs  07/26/16 08/15/16 0248  AST 15 18  ALT 10 13  ALKPHOS 76 74    Recent Labs  05/30/16 07/26/16 08/15/16 0248  WBC 6.2 6.7 8.2  HGB 12.4 14.0 14.4  HCT 41 46 47*  PLT 295 296 221   Lab Results  Component Value Date   TSH 0.04  (A) 07/30/2016   Lab Results  Component Value Date   HGBA1C 5.6 03/07/2016   Lab Results  Component Value Date   CHOL 211 (A) 09/02/2015   HDL 47 09/02/2015   LDLCALC 136 09/02/2015   TRIG 140 09/02/2015   Assessment/Plan 1. Late onset Alzheimer's disease with behavioral disturbance -continues on aricept ODT, namenda, has seroquel and depakote for behaviors, agitation and combativeness  2. Senile osteoporosis -resume attempts at her prolia for staff to give -cont tums and should be on vitamin D  3. Other specified hypothyroidism -recheck tsh as planned -cont levothyroxine  4. Need for prophylactic vaccination against Streptococcus pneumoniae (pneumococcus) -pneumovax 23 ordered  5. Essential hypertension -bp at goal, no changes needed, monitor  6. Slow transit constipation -stable, cont miralax daily and senokot s two bid  Family/ staff Communication: discussed with memory care nurse  Labs/tests ordered:  Keep tsh on schedule, pneumovax

## 2016-10-02 DIAGNOSIS — E039 Hypothyroidism, unspecified: Secondary | ICD-10-CM | POA: Diagnosis not present

## 2016-10-02 LAB — TSH: TSH: 0.16 u[IU]/mL — AB (ref <=5.90)

## 2016-10-18 ENCOUNTER — Non-Acute Institutional Stay: Payer: Medicare Other | Admitting: Adult Health

## 2016-10-18 DIAGNOSIS — R269 Unspecified abnormalities of gait and mobility: Secondary | ICD-10-CM | POA: Diagnosis not present

## 2016-10-18 DIAGNOSIS — F0281 Dementia in other diseases classified elsewhere with behavioral disturbance: Secondary | ICD-10-CM | POA: Diagnosis not present

## 2016-10-18 DIAGNOSIS — M4316 Spondylolisthesis, lumbar region: Secondary | ICD-10-CM

## 2016-10-18 DIAGNOSIS — G301 Alzheimer's disease with late onset: Secondary | ICD-10-CM

## 2016-10-18 NOTE — Progress Notes (Signed)
Location:  Medical illustrator of Service:  ALF (13) Provider:   Peggye Ley, ANP [facility] [phone]   REED, Elmarie Shiley, DO  Patient Care Team: Kermit Balo, DO as PCP - General (Geriatric Medicine) [facility] Kerrin Champagne, MD as Consulting Physician (Orthopedic Surgery) Archer Asa, MD as Consulting Physician (Psychiatry)  Extended Emergency Contact Information Primary Emergency Contact: [name],[name] Address: [phone]          [place] [address] [place] Home Phone: [phone] Mobile Phone: [phone] Relation: None  Code Status:  DNR Goals of care: Advanced Directive information Advanced Directives 10/01/2016  Does Patient Have a Medical Advance Directive? Yes  Type of Advance Directive Out of facility DNR (pink MOST or yellow form);Healthcare Power of [place];Living will  Copy of Healthcare Power of Attorney in Chart? Yes  Pre-existing out of facility DNR order (yellow form or pink MOST form) Yellow form placed in chart (order not valid for inpatient use)     Chief Complaint  Patient presents with  . Acute Visit    gait abnormality    HPI:  Pt is a 83 y.o. female seen today for an acute visit for gait abnormality.  The nurse reports that [name] has had trouble ambulating with wide stannce and slow ambulation for 3-5 days. Staff felt she was in pain. Due to her dementia she can not elaborate.  She has a hx of spondylisthesis, lumbar stenosis, and back surgery.  Staff report that she is eating/drinking well.  Her behaviors as it relates to agitation have improved over time in regards to agitation.  She has not had any other associated findings.  Bowel are moving well, urinating well, not grimacing , no fever etc.     Past Medical History:  Diagnosis Date  . Backache, unspecified 04/10/2009  . Cervicalgia 09/12/2005  . Closed fracture of unspecified part of upper end of  humerus 09/28/2013   Right proximal humerus fracture 09/24/2013  . Delusion (HCC) 06/04/2013  . Herpes zoster with other nervous system complications(053.19) 09/12/2008  . Memory deficit   . Other and unspecified hyperlipidemia 07/10/2009  . Other malaise and fatigue 10/09/2009  . Rash and other nonspecific skin eruption 11/02/2007  . Senile osteoporosis 07/09/2006  . Shortness of breath 11/02/2007  . Unspecified constipation   . Unspecified essential hypertension 11/02/2007  . Unspecified hearing loss 12/05/2008  . Unspecified hypothyroidism 06/05/2005  . Unspecified vitamin D deficiency 02/03/2012   Past Surgical History:  Procedure Laterality Date  . BACK SURGERY  2006   Dr. Lequita Halt  . CATARACT EXTRACTION, BILATERAL    . KIDNEY STONE SURGERY  1959   Dr. Elige Radon  . LAMINECTOMY  03/20/2005   Centtal with Bil L3 & L4 nerve root decompression  Dr. Otelia Sergeant  . SPINE SURGERY  1991   Dr. Otelia Sergeant  . SPINE SURGERY  1996   Dr. Salli Real  . TOTAL KNEE ARTHROPLASTY Left 09/15/2002  . TOTAL KNEE ARTHROPLASTY  2000   Dr. Jerl Santos    Allergies  Allergen Reactions  . Codeine   . Darvocet [Propoxyphene N-Acetaminophen]   . Erythromycin   . Hydantoins   . Lyrica [Pregabalin]   . Morphine And Related   . Penicillins   . Percocet [Oxycodone-Acetaminophen]   . Quaternium-15   . Sulfa Antibiotics   . Sulfites   . Tobramycin-Dexamethasone     Allergies as of 10/18/2016      Reactions  Codeine    Darvocet [propoxyphene N-acetaminophen]    Erythromycin    Hydantoins    Lyrica [pregabalin]    Morphine And Related    Penicillins    Percocet [oxycodone-acetaminophen]    Quaternium-15    Sulfa Antibiotics    Sulfites    Tobramycin-dexamethasone       Medication List       Accurate as of 10/18/16 11:58 AM. Always use your most recent med list.          acetaminophen 325 MG tablet Commonly known as:  TYLENOL Take 650 mg by mouth 3 (three) times daily.   calcium carbonate 500 MG chewable  tablet Commonly known as:  TUMS - dosed in mg elemental calcium Chew 1 tablet by mouth 2 (two) times daily.   carbamide peroxide 6.5 % otic solution Commonly known as:  DEBROX Place 4 drops into both ears once a week.   denosumab 60 MG/ML Soln injection Commonly known as:  PROLIA Inject 60 mg into the skin once. Administer in upper arm, thigh, or abdomen   desvenlafaxine 50 MG 24 hr tablet Commonly known as:  PRISTIQ Take 50 mg by mouth. Take one tablet daily for depression   donepezil 10 MG disintegrating tablet Commonly known as:  ARICEPT ODT Take 1 tablet (10 mg total) by mouth at bedtime.   LORazepam 0.5 MG tablet Commonly known as:  ATIVAN Take 0.5 mg by mouth every 8 (eight) hours as needed for anxiety. Ativan gel 0.5 mg (if not taking PO) topically q 6 hrs prn anxiety, agitation   memantine 10 MG tablet Commonly known as:  NAMENDA Take 10 mg by mouth 2 (two) times daily. For dementia   polyethylene glycol powder powder Commonly known as:  GLYCOLAX/MIRALAX Take 17 g by mouth daily.   QUEtiapine 50 MG tablet Commonly known as:  SEROQUEL Take 75 mg by mouth 2 (two) times daily.   sennosides-docusate sodium 8.6-50 MG tablet Commonly known as:  SENOKOT-S Take 2 tablets by mouth 2 (two) times daily.   Valproate Sodium 250 MG/5ML Soln solution Commonly known as:  DEPAKENE Take 375 mg by mouth 2 (two) times daily.       Review of Systems  Unable to perform ROS: Dementia    Immunization History  Administered Date(s) Administered  . Influenza Inj Mdck Quad Pf 08/01/2016  . Influenza Whole 10/24/2011  . Influenza-Unspecified 07/27/2013, 07/19/2014, 07/27/2015  . PPD Test 06/19/2013  . Pneumococcal Conjugate-13 01/17/2015  . Zoster 05/05/2006   Pertinent  Health Maintenance Due  Topic Date Due  . PNA vac Low Risk Adult (2 of 2 - PPSV23) 01/17/2016  . INFLUENZA VACCINE  Completed  . DEXA SCAN  Completed   Fall Risk  08/17/2014  Falls in the past year? No    Risk for fall due to : History of fall(s)  Risk for fall due to (comments): 09-24-2013 broke right shoulder   Functional Status Survey:    Vitals:   10/18/16 1153  BP: 132/67  Pulse: (!) 57  Resp: 20  Temp: 97.5 F (36.4 C)  SpO2: 96%   There is no height or weight on file to calculate BMI. Physical Exam  Constitutional: No distress.  HENT:  Head: Atraumatic.  Right Ear: External ear normal.  Left Ear: External ear normal.  Nose: Nose normal.  Mouth/Throat: Oropharynx is clear and moist. No oropharyngeal exudate.  Eyes: Conjunctivae are normal. Pupils are equal, round, and reactive to light. Right eye exhibits no discharge.  Left eye exhibits no discharge.  Neck: No JVD present.  Cardiovascular: Normal rate.   No murmur heard. Regular with skipped beats  Pulmonary/Chest: Effort normal and breath sounds normal. No respiratory distress.  Abdominal: Soft. Bowel sounds are normal. She exhibits no distension. There is no tenderness.  Genitourinary: Rectum normal and vagina normal. Rectal exam shows no external hemorrhoid, no internal hemorrhoid, no fissure, no mass, no tenderness and anal tone normal. No vaginal discharge found.  Musculoskeletal: She exhibits no edema or tenderness.  Neg SLR.  No pain with ROM of the hips back or knees.  No spinous process tenderness  Lymphadenopathy:    She has no cervical adenopathy.  Neurological: She is alert.  Oriented to self only, mumbles and has delusions  Skin: Skin is warm and dry. No rash noted. She is not diaphoretic. No erythema.  Psychiatric: She has a normal mood and affect.  Nursing note and vitals reviewed.   Labs reviewed:  Recent Labs  05/30/16 07/26/16 08/15/16 0248  NA 141 142 144  K 4.5 4.7 4.3  BUN 15 17 24*  CREATININE 0.7 0.9 0.8    Recent Labs  07/26/16 08/15/16 0248  AST 15 18  ALT 10 13  ALKPHOS 76 74    Recent Labs  05/30/16 07/26/16 08/15/16 0248  WBC 6.2 6.7 8.2  HGB 12.4 14.0 14.4  HCT 41  46 47*  PLT 295 296 221   Lab Results  Component Value Date   TSH 0.16 (A) 10/02/2016   Lab Results  Component Value Date   HGBA1C 5.6 03/07/2016   Lab Results  Component Value Date   CHOL 211 (A) 09/02/2015   HDL 47 09/02/2015   LDLCALC 136 09/02/2015   TRIG 140 09/02/2015    Significant Diagnostic Results in last 30 days:  No results found.  Assessment/Plan  1) Gait abnormality -appears to have pain with ambulation not able to elicit pain on exam, has a hx of rectal fissure but was not present on exam, this most likely represents a musculoskeletal complaint but its difficult to ascertain due to the limited hx  -needs to use a walker but is not compliant -decrease depakote 250 m bid due to improved behaviors and question if this is effecting her amubulation  2) Lumbar spondylisthesis -hx of this problem with previous stenosis and laminectomy , as well as OP will check lumbosacral xray -due to extensive allergy list will try aleve 220 mg BID for 7 days  3) AD with behaviors -improved see above   Family/ staff Communication: discussed with staff/resident  Labs/tests ordered:  Lumbar sacral xray

## 2016-11-14 ENCOUNTER — Non-Acute Institutional Stay (SKILLED_NURSING_FACILITY): Payer: Medicare Other | Admitting: Adult Health

## 2016-11-14 DIAGNOSIS — F5101 Primary insomnia: Secondary | ICD-10-CM | POA: Diagnosis not present

## 2016-11-14 DIAGNOSIS — E039 Hypothyroidism, unspecified: Secondary | ICD-10-CM | POA: Diagnosis not present

## 2016-11-14 DIAGNOSIS — F0281 Dementia in other diseases classified elsewhere with behavioral disturbance: Secondary | ICD-10-CM

## 2016-11-14 DIAGNOSIS — G301 Alzheimer's disease with late onset: Secondary | ICD-10-CM

## 2016-11-14 LAB — TSH: TSH: 0.57 u[IU]/mL (ref 0.41–5.90)

## 2016-11-14 NOTE — Progress Notes (Signed)
Location:  Medical illustrator of Service:  ALF (13) Provider:   Peggye Ley, ANP [facility] [phone]   REED, Christy Shiley, [name]  Patient Care Team: Kermit Balo, [name] as PCP - General (Geriatric Medicine) [facility] Kerrin Champagne, MD as Consulting Physician (Orthopedic Surgery) Archer Asa, MD as Consulting Physician (Psychiatry)  Extended Emergency Contact Information Primary Emergency Contact: [name],[name] Address: [phone]          [place] [address] [place] Home Phone: [phone] Mobile Phone: [phone] Relation: None  Code Status:  DNR Goals of care: Advanced Directive information Advanced Directives 10/01/2016  Does Patient Have a Medical Advance Directive? Yes  Type of Advance Directive Out of facility DNR (pink MOST or yellow form);Healthcare Power of [place];Living will  Copy of Healthcare Power of Attorney in Chart? Yes  Pre-existing out of facility DNR order (yellow form or pink MOST form) Yellow form placed in chart (order not valid for inpatient use)     Chief Complaint  Patient presents with  . Acute Visit    behaviors, sleeping during the day    HPI:  Pt is a 83 y.o. female today as an acute visit for reports of increased agitation and sleeping during the day. She was seen in Jan and her Depakote dosage was decreased due to difficulty walking. She appeared to have some discomfort and aleve was prescribed for one week. Staff report that she has had more agitation since then but her daughter reports that she also trouble sleeping during the day and being up at night. Due to her advanced dementia it is difficult to obtain a hx. She resides in memory care and staff report that she is using the wheelchair more frequently. She does not appear to be in pain. VS are stable.   Past Medical History:  Diagnosis Date  . Backache, unspecified 04/10/2009  . Cervicalgia 09/12/2005   . Closed fracture of unspecified part of upper end of humerus 09/28/2013   Right proximal humerus fracture 09/24/2013  . Delusion (HCC) 06/04/2013  . Herpes zoster with other nervous system complications(053.19) 09/12/2008  . Memory deficit   . Other and unspecified hyperlipidemia 07/10/2009  . Other malaise and fatigue 10/09/2009  . Rash and other nonspecific skin eruption 11/02/2007  . Senile osteoporosis 07/09/2006  . Shortness of breath 11/02/2007  . Unspecified constipation   . Unspecified essential hypertension 11/02/2007  . Unspecified hearing loss 12/05/2008  . Unspecified hypothyroidism 06/05/2005  . Unspecified vitamin D deficiency 02/03/2012   Past Surgical History:  Procedure Laterality Date  . BACK SURGERY  2006   Dr. Lequita Halt  . CATARACT EXTRACTION, BILATERAL    . KIDNEY STONE SURGERY  1959   Dr. Elige Radon  . LAMINECTOMY  03/20/2005   Centtal with Bil L3 & L4 nerve root decompression  Dr. Otelia Sergeant  . SPINE SURGERY  1991   Dr. Otelia Sergeant  . SPINE SURGERY  1996   Dr. Salli Real  . TOTAL KNEE ARTHROPLASTY Left 09/15/2002  . TOTAL KNEE ARTHROPLASTY  2000   Dr. Jerl Santos    Allergies  Allergen Reactions  . Codeine   . Darvocet [Propoxyphene N-Acetaminophen]   . Erythromycin   . Hydantoins   . Lyrica [Pregabalin]   . Morphine And Related   . Penicillins   . Percocet [Oxycodone-Acetaminophen]   . Quaternium-15   . Sulfa Antibiotics   . Sulfites   . Tobramycin-Dexamethasone     Allergies as  of 11/14/2016      Reactions   Codeine    Darvocet [propoxyphene N-acetaminophen]    Erythromycin    Hydantoins    Lyrica [pregabalin]    Morphine And Related    Penicillins    Percocet [oxycodone-acetaminophen]    Quaternium-15    Sulfa Antibiotics    Sulfites    Tobramycin-dexamethasone       Medication List       Accurate as of 11/14/16 11:59 PM. Always use your most recent med list.          acetaminophen 325 MG tablet Commonly known as:  TYLENOL Take 650 mg by mouth 3  (three) times daily.   calcium carbonate 500 MG chewable tablet Commonly known as:  TUMS - dosed in mg elemental calcium Chew 1 tablet by mouth 2 (two) times daily.   carbamide peroxide 6.5 % otic solution Commonly known as:  DEBROX Place 4 drops into both ears once a week.   denosumab 60 MG/ML Soln injection Commonly known as:  PROLIA Inject 60 mg into the skin once. Administer in upper arm, thigh, or abdomen   desvenlafaxine 50 MG 24 hr tablet Commonly known as:  PRISTIQ Take 50 mg by mouth. Take one tablet daily for depression   donepezil 10 MG disintegrating tablet Commonly known as:  ARICEPT ODT Take 1 tablet (10 mg total) by mouth at bedtime.   levothyroxine 88 MCG tablet Commonly known as:  SYNTHROID, LEVOTHROID Take 88 mcg by mouth daily before breakfast.   LORazepam 0.5 MG tablet Commonly known as:  ATIVAN Take 0.5 mg by mouth every 8 (eight) hours as needed for anxiety. Ativan gel 0.5 mg (if not taking PO) topically q 6 hrs prn anxiety, agitation   memantine 10 MG tablet Commonly known as:  NAMENDA Take 10 mg by mouth 2 (two) times daily. For dementia   polyethylene glycol powder powder Commonly known as:  GLYCOLAX/MIRALAX Take 17 g by mouth daily.   QUEtiapine 50 MG tablet Commonly known as:  SEROQUEL Take 75 mg by mouth 2 (two) times daily.   sennosides-docusate sodium 8.6-50 MG tablet Commonly known as:  SENOKOT-S Take 2 tablets by mouth 2 (two) times daily.   Valproate Sodium 250 MG/5ML Soln solution Commonly known as:  DEPAKENE Take 375 mg by mouth 2 (two) times daily.       Review of Systems  Unable to perform ROS: Dementia    Immunization History  Administered Date(s) Administered  . Influenza Inj Mdck Quad Pf 08/01/2016  . Influenza Whole 10/24/2011  . Influenza-Unspecified 07/27/2013, 07/19/2014, 07/27/2015  . PPD Test 06/19/2013  . Pneumococcal Conjugate-13 01/17/2015  . Zoster 05/05/2006   Pertinent  Health Maintenance Due  Topic  Date Due  . PNA vac Low Risk Adult (2 of 2 - PPSV23) 01/17/2016  . INFLUENZA VACCINE  Completed  . DEXA SCAN  Completed   Fall Risk  08/17/2014  Falls in the past year? No  Risk for fall due to : History of fall(s)  Risk for fall due to (comments): 09-24-2013 broke right shoulder   Functional Status Survey:    Vitals:   11/21/16 1638  BP: (!) 118/54  Pulse: (!) 55  Resp: 18  Temp: 97.4 F (36.3 C)  SpO2: 90%  Weight: 151 lb 11.2 oz (68.8 kg)   Body mass index is 28.66 kg/m. Physical Exam  Constitutional: No distress.  HENT:  Head: Atraumatic.  Right Ear: External ear normal.  Left Ear: External ear  normal.  Nose: Nose normal.  Mouth/Throat: Oropharynx is clear and moist. No oropharyngeal exudate.  Eyes: Conjunctivae are normal. Pupils are equal, round, and reactive to light. Right eye exhibits no discharge. Left eye exhibits no discharge.  Neck: No JVD present.  Cardiovascular: Normal rate.   No murmur heard. Regular with skipped beats  Pulmonary/Chest: Effort normal and breath sounds normal. No respiratory distress.  Abdominal: Soft. Bowel sounds are normal. She exhibits no distension. There is no tenderness.  Genitourinary: Rectum normal and vagina normal. Rectal exam shows no external hemorrhoid, no internal hemorrhoid, no fissure, no mass, no tenderness and anal tone normal. No vaginal discharge found.  Musculoskeletal: She exhibits no edema or tenderness.  Lymphadenopathy:    She has no cervical adenopathy.  Neurological: She is alert.  Oriented to self only, mumbles and has delusions  Skin: Skin is warm and dry. No rash noted. She is not diaphoretic. No erythema.  Psychiatric: She has a normal mood and affect.  Nursing note and vitals reviewed.   Labs reviewed:  Recent Labs  05/30/16 07/26/16 08/15/16 0248  NA 141 142 144  K 4.5 4.7 4.3  BUN 15 17 24*  CREATININE 0.7 0.9 0.8    Recent Labs  07/26/16 08/15/16 0248  AST 15 18  ALT 10 13  ALKPHOS  76 74    Recent Labs  05/30/16 07/26/16 08/15/16 0248  WBC 6.2 6.7 8.2  HGB 12.4 14.0 14.4  HCT 41 46 47*  PLT 295 296 221   Lab Results  Component Value Date   TSH 0.57 11/14/2016   Lab Results  Component Value Date   HGBA1C 5.6 03/07/2016   Lab Results  Component Value Date   CHOL 211 (A) 09/02/2015   HDL 47 09/02/2015   LDLCALC 136 09/02/2015   TRIG 140 09/02/2015    Significant Diagnostic Results in last 30 days:  No results found.  Assessment/Plan  1. Late onset Alzheimer's disease with behavioral disturbance Increase depakote to 375 BID Failed reduction last month  2. Primary insomnia Try to keep her awake during the day and use melatonin 5 mg qhs to help her sleep at night.    Family/ staff Communication: discussed with staff/resident  Labs/tests ordered:NA

## 2016-11-21 DIAGNOSIS — G4700 Insomnia, unspecified: Secondary | ICD-10-CM | POA: Insufficient documentation

## 2016-12-03 DIAGNOSIS — B351 Tinea unguium: Secondary | ICD-10-CM | POA: Diagnosis not present

## 2016-12-17 ENCOUNTER — Encounter: Payer: Self-pay | Admitting: Internal Medicine

## 2016-12-17 ENCOUNTER — Non-Acute Institutional Stay: Payer: Medicare Other | Admitting: Internal Medicine

## 2016-12-17 DIAGNOSIS — F0393 Unspecified dementia, unspecified severity, with mood disturbance: Secondary | ICD-10-CM

## 2016-12-17 DIAGNOSIS — F329 Major depressive disorder, single episode, unspecified: Secondary | ICD-10-CM | POA: Diagnosis not present

## 2016-12-17 DIAGNOSIS — G301 Alzheimer's disease with late onset: Secondary | ICD-10-CM

## 2016-12-17 DIAGNOSIS — E038 Other specified hypothyroidism: Secondary | ICD-10-CM | POA: Diagnosis not present

## 2016-12-17 DIAGNOSIS — F0280 Dementia in other diseases classified elsewhere without behavioral disturbance: Secondary | ICD-10-CM | POA: Diagnosis not present

## 2016-12-17 DIAGNOSIS — R269 Unspecified abnormalities of gait and mobility: Secondary | ICD-10-CM | POA: Diagnosis not present

## 2016-12-17 DIAGNOSIS — F32A Depression, unspecified: Secondary | ICD-10-CM

## 2016-12-17 DIAGNOSIS — R627 Adult failure to thrive: Secondary | ICD-10-CM | POA: Diagnosis not present

## 2016-12-17 DIAGNOSIS — M810 Age-related osteoporosis without current pathological fracture: Secondary | ICD-10-CM

## 2016-12-17 DIAGNOSIS — F02818 Dementia in other diseases classified elsewhere, unspecified severity, with other behavioral disturbance: Secondary | ICD-10-CM

## 2016-12-17 DIAGNOSIS — F0281 Dementia in other diseases classified elsewhere with behavioral disturbance: Secondary | ICD-10-CM

## 2016-12-17 NOTE — Progress Notes (Signed)
Patient ID: [name], female   DOB: 07/21/1927, 83 y.o.   MRN: [id number]  Location:  [facility] Room Number: 307 Memory Care Place of Service:  ALF (13) Provider:  Teiana Hajduk L. Renato Gailseed, D.O., C.M.D.  Bufford SpikesEED, Maks Cavallero, DO  Patient Care Team: Kermit Baloiffany L Khalin Royce, DO as PCP - General (Geriatric Medicine) [facility] Kerrin ChampagneJames E Nitka, MD as Consulting Physician (Orthopedic Surgery) Archer AsaGerald Plovsky, MD as Consulting Physician (Psychiatry)  Extended Emergency Contact Information Primary Emergency Contact: [name],[name] Address: [phone]          [place] [id number] [place] Home Phone: [phone] Mobile Phone: [phone] Relation: None  Code Status:  DNR Goals of care: Advanced Directive information Advanced Directives 12/17/2016  Does Patient Have a Medical Advance Directive? Yes  Type of Advance Directive Out of facility DNR (pink MOST or yellow form);Healthcare Power of [place];Living will  Copy of Healthcare Power of Attorney in Chart? Yes  Pre-existing out of facility DNR order (yellow form or pink MOST form) Yellow form placed in chart (order not valid for inpatient use)   Chief Complaint  Patient presents with  . Medical Management of Chronic Issues    Routine visit    HPI:  Pt is a 83 y.o. female seen today for medical management of chronic diseases.     Alzheimer's disease:  Progressing as expected with increased difficulty ambulating.   She also continues to appear down and depressed, tearful at times (?due to the hallucinations/delusional thoughts).  Her depakote had been reduced due to her sleepiness and unsteady gain so she was on 250mg  po bid.  Behaviors had also been improved in Jan when this was done.  She then began to have problems with her sleep cycles being reversed.  She was sleeping in the social area of the unit today while the other residents were doing activities today after the  change in medication.  She is then awake at night.  She continues to talk to herself and seems to see and hear things not heard by others. In feb the depakote was increased again to 375mg  po bid.  It does not appear that this has helped her either.  She lost 6 lbs.  Staff were saying they did not know what to do with her anymore but this was not clear what this meant.  Osteoporosis:  Was receiving prolia every 6 mos for this.  Last given in nov and next due in may.  She is ambulating less due to unsteadiness.  Remains on calcium in the form of tums bid and vitamin D daily 2000 units.    Hypothyroidism:  tsh rechecked 2/1 within normal range.    Depression:  Antidepressant changed from pristiq to effexor due to difficulty swallowing the pristiq.  Is more tearful but also has not been long enough to get effexor fully into her system.  Past Medical History:  Diagnosis Date  . Backache, unspecified 04/10/2009  . Cervicalgia 09/12/2005  . Closed fracture of unspecified part of upper end of humerus 09/28/2013   Right proximal humerus fracture 09/24/2013  . Delusion (HCC) 06/04/2013  . Herpes zoster with other nervous system complications(053.19) 09/12/2008  . Memory deficit   . Other and unspecified hyperlipidemia 07/10/2009  . Other malaise and fatigue 10/09/2009  . Rash and other nonspecific skin eruption 11/02/2007  . Senile osteoporosis 07/09/2006  . Shortness of breath 11/02/2007  . Unspecified constipation   . Unspecified essential hypertension  11/02/2007  . Unspecified hearing loss 12/05/2008  . Unspecified hypothyroidism 06/05/2005  . Unspecified vitamin D deficiency 02/03/2012   Past Surgical History:  Procedure Laterality Date  . BACK SURGERY  2006   Dr. Lequita Halt  . CATARACT EXTRACTION, BILATERAL    . KIDNEY STONE SURGERY  1959   Dr. Elige Radon  . LAMINECTOMY  03/20/2005   Centtal with Bil L3 & L4 nerve root decompression  Dr. Otelia Sergeant  . SPINE SURGERY  1991   Dr. Otelia Sergeant  . SPINE SURGERY  1996    Dr. Salli Real  . TOTAL KNEE ARTHROPLASTY Left 09/15/2002  . TOTAL KNEE ARTHROPLASTY  2000   Dr. Jerl Santos    Allergies  Allergen Reactions  . Codeine   . Darvocet [Propoxyphene N-Acetaminophen]   . Erythromycin   . Hydantoins   . Lyrica [Pregabalin]   . Morphine And Related   . Penicillins   . Percocet [Oxycodone-Acetaminophen]   . Quaternium-15   . Sulfa Antibiotics   . Sulfites   . Tobramycin-Dexamethasone     Allergies as of 12/17/2016      Reactions   Codeine    Darvocet [propoxyphene N-acetaminophen]    Erythromycin    Hydantoins    Lyrica [pregabalin]    Morphine And Related    Penicillins    Percocet [oxycodone-acetaminophen]    Quaternium-15    Sulfa Antibiotics    Sulfites    Tobramycin-dexamethasone       Medication List       Accurate as of 12/17/16  2:50 PM. Always use your most recent med list.          acetaminophen 325 MG tablet Commonly known as:  TYLENOL Take 650 mg by mouth 3 (three) times daily.   calcium carbonate 500 MG chewable tablet Commonly known as:  TUMS - dosed in mg elemental calcium Chew 1 tablet by mouth 2 (two) times daily.   denosumab 60 MG/ML Soln injection Commonly known as:  PROLIA Inject 60 mg into the skin once. Administer in upper arm, thigh, or abdomen   desvenlafaxine 50 MG 24 hr tablet Commonly known as:  PRISTIQ Take 50 mg by mouth. Take one tablet daily for depression   donepezil 10 MG disintegrating tablet Commonly known as:  ARICEPT ODT Take 1 tablet (10 mg total) by mouth at bedtime.   levothyroxine 88 MCG tablet Commonly known as:  SYNTHROID, LEVOTHROID Take 88 mcg by mouth daily before breakfast.   LORazepam 0.5 MG tablet Commonly known as:  ATIVAN Take 0.5 mg by mouth every 8 (eight) hours as needed for anxiety. Ativan gel 0.5 mg (if not taking PO) topically q 6 hrs prn anxiety, agitation   memantine 10 MG tablet Commonly known as:  NAMENDA Take 10 mg by mouth 2 (two) times daily. For dementia     polyethylene glycol powder powder Commonly known as:  GLYCOLAX/MIRALAX Take 17 g by mouth daily.   QUEtiapine 50 MG tablet Commonly known as:  SEROQUEL Take 75 mg by mouth 2 (two) times daily.   sennosides-docusate sodium 8.6-50 MG tablet Commonly known as:  SENOKOT-S Take 2 tablets by mouth 2 (two) times daily.   Valproate Sodium 250 MG/5ML Soln solution Commonly known as:  DEPAKENE Take 375 mg by mouth 2 (two) times daily.   Vitamin D3 2000 units capsule Take 2,000 Units by mouth daily.       Review of Systems  Unable to perform ROS: Dementia    Immunization History  Administered Date(s) Administered  .  Influenza Inj Mdck Quad Pf 08/01/2016  . Influenza Whole 10/24/2011  . Influenza-Unspecified 07/27/2013, 07/19/2014, 07/27/2015  . PPD Test 06/19/2013  . Pneumococcal Conjugate-13 01/17/2015  . Zoster 05/05/2006   Pertinent  Health Maintenance Due  Topic Date Due  . PNA vac Low Risk Adult (2 of 2 - PPSV23) 01/17/2016  . INFLUENZA VACCINE  Completed  . DEXA SCAN  Completed   Fall Risk  08/17/2014  Falls in the past year? No  Risk for fall due to : History of fall(s)  Risk for fall due to (comments): 09-24-2013 broke right shoulder   Functional Status Survey:    Vitals:   12/17/16 1408  BP: 133/62  Pulse: 68  Resp: 18  Temp: 97.4 F (36.3 C)  TempSrc: Oral  SpO2: 96%  Weight: 145 lb (65.8 kg)   Body mass index is 27.4 kg/m. Physical Exam  Constitutional:  Drowsy female resting in recliner chair in social area of memory unit  Cardiovascular: Normal rate, regular rhythm, normal heart sounds and intact distal pulses.   Pulmonary/Chest: Effort normal and breath sounds normal. No respiratory distress.  Abdominal: Soft. Bowel sounds are normal. She exhibits no distension and no mass. There is no tenderness. There is no rebound and no guarding.  Musculoskeletal: Normal range of motion.  Using wheelchair more to get around due to unsteady gait    Neurological:  Lethargic, but arousable, not able to focus  Skin: Skin is warm and dry.  Psychiatric:  Talking to herself, rambling on, tearful    Labs reviewed:  Recent Labs  05/30/16 07/26/16 08/15/16 0248  NA 141 142 144  K 4.5 4.7 4.3  BUN 15 17 24*  CREATININE 0.7 0.9 0.8    Recent Labs  07/26/16 08/15/16 0248  AST 15 18  ALT 10 13  ALKPHOS 76 74    Recent Labs  05/30/16 07/26/16 08/15/16 0248  WBC 6.2 6.7 8.2  HGB 12.4 14.0 14.4  HCT 41 46 47*  PLT 295 296 221   Lab Results  Component Value Date   TSH 0.57 11/14/2016   Lab Results  Component Value Date   HGBA1C 5.6 03/07/2016   Lab Results  Component Value Date   CHOL 211 (A) 09/02/2015   HDL 47 09/02/2015   LDLCALC 136 09/02/2015   TRIG 140 09/02/2015    Assessment/Plan 1. Late onset Alzheimer's disease with behavioral disturbance -would favor taper off aricept and namenda in near future and reduction of sedating meds that are not helping with agitation anyway -does have halllucinations/delusions so may need to the seroquel but since she did not improve with the depakote, would like to taper it off (first before stopping memory meds) -avoid ativan b/c it worsens agitation in demented patients (not always immediately but when they wake up from sedation)  2. Depression due to dementia -cont effexor--await 4-6 wks on new med before making changes b/c it takes that long to be fully in the system  3. Abnormality of gait -expected as dementia progresses and may be worsened by the medications so again, favor coming back off depakote since she has not improved  4. Senile osteoporosis -due for prolia in may if [facility] staff can get the authorization and medication and give to her b/c we are not permitted by insurance to give to her in the clinic anymore -cont ca and vitamin D as able to take  5. Other specified hypothyroidism -cont levothyroxine 75 mcg daily  6. Adult failure to thrive -worsening  as  dementia progresses, encourage po intake and cont to monitor weight  Family/ staff Communication: discussed with memory care nurses  Labs/tests ordered:  depakote level

## 2016-12-30 ENCOUNTER — Non-Acute Institutional Stay: Payer: Medicare Other | Admitting: Adult Health

## 2016-12-30 ENCOUNTER — Encounter: Payer: Self-pay | Admitting: Adult Health

## 2016-12-30 DIAGNOSIS — F0281 Dementia in other diseases classified elsewhere with behavioral disturbance: Secondary | ICD-10-CM

## 2016-12-30 DIAGNOSIS — F329 Major depressive disorder, single episode, unspecified: Secondary | ICD-10-CM

## 2016-12-30 DIAGNOSIS — F0280 Dementia in other diseases classified elsewhere without behavioral disturbance: Secondary | ICD-10-CM

## 2016-12-30 DIAGNOSIS — F02818 Dementia in other diseases classified elsewhere, unspecified severity, with other behavioral disturbance: Secondary | ICD-10-CM

## 2016-12-30 DIAGNOSIS — G301 Alzheimer's disease with late onset: Secondary | ICD-10-CM

## 2016-12-30 DIAGNOSIS — R634 Abnormal weight loss: Secondary | ICD-10-CM

## 2016-12-30 DIAGNOSIS — F0393 Unspecified dementia, unspecified severity, with mood disturbance: Secondary | ICD-10-CM

## 2016-12-30 NOTE — Progress Notes (Signed)
[facility]   Place of Service:  ALF (13) Provider:   Peggye Leyhristy Domique Ward, ANP [facility] [phone]   REED, Christy ShileyIFFANY, DO  Patient Care Team: Kermit Baloiffany L Reed, DO as PCP - General (Geriatric Medicine) [facility] Kerrin ChampagneJames E Nitka, MD as Consulting Physician (Orthopedic Surgery) Archer AsaGerald Plovsky, MD as Consulting Physician (Psychiatry)  Extended Emergency Contact Information Primary Emergency Contact: [name],[name] Address: [phone]          [place] [id number] [place] Home Phone: [phone] Mobile Phone: [phone] Relation: None  Code Status:  DNR Goals of care: Advanced Directive information Advanced Directives 12/17/2016  Does Patient Have a Medical Advance Directive? Yes  Type of Advance Directive Out of facility DNR (pink MOST or yellow form);Healthcare Power of [place];Living will  Copy of Healthcare Power of Attorney in Chart? Yes  Pre-existing out of facility DNR order (yellow form or pink MOST form) Yellow form placed in chart (order not valid for inpatient use)     Chief Complaint  Patient presents with  . Acute Visit    depressed with poor intake    HPI:  Pt is a 83 y.o. female today for reports of depression and poor appetite. She has advanced dementia with behaviors and resides in the memory care unit. Not able to perform for MMSE. Staff report that she has appeared sad and has poor oral intake. She was changed from Pristiq to Effexor one month ago due to issues swallowing the Pristiq.  She had periods of agitation in Jan and Depakote was increased for this reason.  There has been no prn use of Ativan in the past two weeks VS are stable.  Weights are trending downward for the month of March as below Wt Readings from Last 3 Encounters:  12/30/16 145 lb (65.8 kg)  12/17/16 145 lb (65.8 kg)  11/21/16 151 lb 11.2 oz (68.8 kg)     Past Medical History:  Diagnosis Date  .  Backache, unspecified 04/10/2009  . Cervicalgia 09/12/2005  . Closed fracture of unspecified part of upper end of humerus 09/28/2013   Right proximal humerus fracture 09/24/2013  . Delusion (HCC) 06/04/2013  . Herpes zoster with other nervous system complications(053.19) 09/12/2008  . Memory deficit   . Other and unspecified hyperlipidemia 07/10/2009  . Other malaise and fatigue 10/09/2009  . Rash and other nonspecific skin eruption 11/02/2007  . Senile osteoporosis 07/09/2006  . Shortness of breath 11/02/2007  . Unspecified constipation   . Unspecified essential hypertension 11/02/2007  . Unspecified hearing loss 12/05/2008  . Unspecified hypothyroidism 06/05/2005  . Unspecified vitamin D deficiency 02/03/2012   Past Surgical History:  Procedure Laterality Date  . BACK SURGERY  2006   Dr. Lequita HaltAluisio  . CATARACT EXTRACTION, BILATERAL    . KIDNEY STONE SURGERY  1959   Dr. Elige RadonBradley  . LAMINECTOMY  03/20/2005   Centtal with Bil L3 & L4 nerve root decompression  Dr. Otelia SergeantNitka  . SPINE SURGERY  1991   Dr. Otelia SergeantNitka  . SPINE SURGERY  1996   Dr. Salli RealNitak  . TOTAL KNEE ARTHROPLASTY Left 09/15/2002  . TOTAL KNEE ARTHROPLASTY  2000   Dr. Jerl Santosalldorf    Allergies  Allergen Reactions  . Codeine   . Darvocet [Propoxyphene N-Acetaminophen]   . Erythromycin   . Hydantoins   . Lyrica [Pregabalin]   . Morphine And Related   . Penicillins   . Percocet [Oxycodone-Acetaminophen]   . Quaternium-15   .  Sulfa Antibiotics   . Sulfites   . Tobramycin-Dexamethasone     Allergies as of 12/30/2016      Reactions   Codeine    Darvocet [propoxyphene N-acetaminophen]    Erythromycin    Hydantoins    Lyrica [pregabalin]    Morphine And Related    Penicillins    Percocet [oxycodone-acetaminophen]    Quaternium-15    Sulfa Antibiotics    Sulfites    Tobramycin-dexamethasone       Medication List       Accurate as of 12/30/16  4:49 PM. Always use your most recent med list.          acetaminophen 325 MG  tablet Commonly known as:  TYLENOL Take 650 mg by mouth 3 (three) times daily.   calcium carbonate 500 MG chewable tablet Commonly known as:  TUMS - dosed in mg elemental calcium Chew 1 tablet by mouth 2 (two) times daily.   denosumab 60 MG/ML Soln injection Commonly known as:  PROLIA Inject 60 mg into the skin once. Administer in upper arm, thigh, or abdomen   donepezil 10 MG disintegrating tablet Commonly known as:  ARICEPT ODT Take 1 tablet (10 mg total) by mouth at bedtime.   levothyroxine 75 MCG tablet Commonly known as:  SYNTHROID, LEVOTHROID Take 75 mcg by mouth daily before breakfast.   LORazepam 0.5 MG tablet Commonly known as:  ATIVAN Take 0.5 mg by mouth 2 (two) times daily. Ativan gel 0.5 mg (if not taking PO) topically q 8 hrs prn anxiety, agitation   Melatonin 5 MG Tabs Take 5 mg by mouth at bedtime.   memantine 10 MG tablet Commonly known as:  NAMENDA Take 10 mg by mouth 2 (two) times daily. For dementia   oseltamivir 75 MG capsule Commonly known as:  TAMIFLU Take 75 mg by mouth daily. X 14 days for prophylaxis   polyethylene glycol powder powder Commonly known as:  GLYCOLAX/MIRALAX Take 17 g by mouth daily.   QUEtiapine 50 MG tablet Commonly known as:  SEROQUEL Take 75 mg by mouth 2 (two) times daily.   sennosides-docusate sodium 8.6-50 MG tablet Commonly known as:  SENOKOT-S Take 2 tablets by mouth 2 (two) times daily.   Valproate Sodium 250 MG/5ML Soln solution Commonly known as:  DEPAKENE Take 375 mg by mouth 2 (two) times daily.   venlafaxine 25 MG tablet Commonly known as:  EFFEXOR Take 25 mg by mouth 2 (two) times daily with a meal.   Vitamin D3 2000 units capsule Take 2,000 Units by mouth daily.       Review of Systems  Unable to perform ROS: Dementia    Immunization History  Administered Date(s) Administered  . Influenza Inj Mdck Quad Pf 08/01/2016  . Influenza Whole 10/24/2011  . Influenza-Unspecified 07/27/2013,  07/19/2014, 07/27/2015  . PPD Test 06/19/2013  . Pneumococcal Conjugate-13 01/17/2015  . Zoster 05/05/2006   Pertinent  Health Maintenance Due  Topic Date Due  . PNA vac Low Risk Adult (2 of 2 - PPSV23) 01/17/2016  . INFLUENZA VACCINE  Completed  . DEXA SCAN  Completed   Fall Risk  08/17/2014  Falls in the past year? No  Risk for fall due to : History of fall(s)  Risk for fall due to (comments): 09-24-2013 broke right shoulder   Functional Status Survey:    Vitals:   12/30/16 1104  BP: 120/64  Pulse: 68  Resp: 18  Temp: 98 F (36.7 C)  SpO2: 94%  Weight:  145 lb (65.8 kg)   Body mass index is 27.4 kg/m. Physical Exam  Constitutional: No distress.  HENT:  Head: Atraumatic.  Right Ear: External ear normal.  Left Ear: External ear normal.  Nose: Nose normal.  Mouth/Throat: Oropharynx is clear and moist. No oropharyngeal exudate.  Eyes: Conjunctivae are normal. Pupils are equal, round, and reactive to light. Right eye exhibits no discharge. Left eye exhibits no discharge.  Neck: No JVD present.  Cardiovascular: Normal rate.   No murmur heard. Regular with skipped beats  Pulmonary/Chest: Effort normal and breath sounds normal. No respiratory distress.  Abdominal: Soft. Bowel sounds are normal. She exhibits no distension. There is no tenderness.  Neurological: She is alert. Not oriented, intermittently able to f/c Skin: Skin is warm and dry. No rash noted. She is not diaphoretic. No erythema.  Psychiatric: She has a normal mood and affect.  Nursing note and vitals reviewed.   Labs reviewed:  Recent Labs  05/30/16 07/26/16 08/15/16 0248  NA 141 142 144  K 4.5 4.7 4.3  BUN 15 17 24*  CREATININE 0.7 0.9 0.8    Recent Labs  07/26/16 08/15/16 0248  AST 15 18  ALT 10 13  ALKPHOS 76 74    Recent Labs  05/30/16 07/26/16 08/15/16 0248  WBC 6.2 6.7 8.2  HGB 12.4 14.0 14.4  HCT 41 46 47*  PLT 295 296 221   Lab Results  Component Value Date   TSH 0.57  11/14/2016   Lab Results  Component Value Date   HGBA1C 5.6 03/07/2016   Lab Results  Component Value Date   CHOL 211 (A) 09/02/2015   HDL 47 09/02/2015   LDLCALC 136 09/02/2015   TRIG 140 09/02/2015    Significant Diagnostic Results in last 30 days:  No results found.  Assessment/Plan  1. Late onset Alzheimer's disease with behavioral disturbance Progressive cognitive and functional losses noted Still may benefit from Aricept and Namenda so will on board for now Continue Depakote and Seroquel (failed previous dose reduction)  2. Depression due to dementia Seems sad for my visit and withdrawn Increase Effexor to 50 mg BID  3. Weight loss Monitor weight bi monthly Boost or breeze BID    Family/ staff Communication: discussed with staff/resident  Labs/tests ordered:NA

## 2017-02-13 ENCOUNTER — Encounter: Payer: Self-pay | Admitting: Adult Health

## 2017-02-13 ENCOUNTER — Non-Acute Institutional Stay: Payer: Medicare Other | Admitting: Adult Health

## 2017-02-13 DIAGNOSIS — M810 Age-related osteoporosis without current pathological fracture: Secondary | ICD-10-CM | POA: Diagnosis not present

## 2017-02-13 DIAGNOSIS — E559 Vitamin D deficiency, unspecified: Secondary | ICD-10-CM | POA: Diagnosis not present

## 2017-02-13 DIAGNOSIS — G301 Alzheimer's disease with late onset: Secondary | ICD-10-CM | POA: Diagnosis not present

## 2017-02-13 DIAGNOSIS — F329 Major depressive disorder, single episode, unspecified: Secondary | ICD-10-CM

## 2017-02-13 DIAGNOSIS — Z23 Encounter for immunization: Secondary | ICD-10-CM

## 2017-02-13 DIAGNOSIS — R269 Unspecified abnormalities of gait and mobility: Secondary | ICD-10-CM

## 2017-02-13 DIAGNOSIS — K5901 Slow transit constipation: Secondary | ICD-10-CM

## 2017-02-13 DIAGNOSIS — F0393 Unspecified dementia, unspecified severity, with mood disturbance: Secondary | ICD-10-CM

## 2017-02-13 DIAGNOSIS — F02818 Dementia in other diseases classified elsewhere, unspecified severity, with other behavioral disturbance: Secondary | ICD-10-CM

## 2017-02-13 DIAGNOSIS — F0281 Dementia in other diseases classified elsewhere with behavioral disturbance: Secondary | ICD-10-CM

## 2017-02-13 DIAGNOSIS — F0280 Dementia in other diseases classified elsewhere without behavioral disturbance: Secondary | ICD-10-CM

## 2017-02-13 NOTE — Progress Notes (Signed)
Location:  Medical illustrator of Service:  ALF (13) Provider:   Peggye Ley, ANP [facility] [phone]   REED, Elmarie Shiley, DO  Patient Care Team: Kermit Balo, DO as PCP - General (Geriatric Medicine) [facility] Kerrin Champagne, MD as Consulting Physician (Orthopedic Surgery) Archer Asa, MD as Consulting Physician (Psychiatry)  Extended Emergency Contact Information Primary Emergency Contact: [name],[name] Address: [phone]          [place] [address] [place] Home Phone: [phone] Mobile Phone: [phone] Relation: None  Code Status:  DNR Goals of care: Advanced Directive information Advanced Directives 02/13/2017  Does Patient Have a Medical Advance Directive? Yes  Type of Advance Directive Out of facility DNR (pink MOST or yellow form);Healthcare Power of [facility];Living will  Copy of Healthcare Power of Attorney in Chart? Yes  Pre-existing out of facility DNR order (yellow form or pink MOST form) Yellow form placed in chart (order not valid for inpatient use)     Chief Complaint  Patient presents with  . Medical Management of Chronic Issues    HPI:  Pt is a 83 y.o. female seen today for medical management of chronic diseases.  She resdies in memory care due to advanced Alzheimer's dementia.  She was changed to Effexor from Pristiq recently due to difficulty swallowing. The dose was increased in March to 50 mg BID. The nurse reports that the crying episodes have improved. Previously was very agitated.  Has more gait issues over time and has a walker but needs to be reminded to use it.  Her weight is stable at 156 lbs.  She is due for Prolia but her daughter decided to hold off on the dose due to the cost (over $600).  She does not receive DEXA scans due to the potential for worsening agitation when she leaves the facility. Calcium was discontinued as she would not swallow it. She  continues on Vit D.   Past Medical History:  Diagnosis Date  . Backache, unspecified 04/10/2009  . Cervicalgia 09/12/2005  . Closed fracture of unspecified part of upper end of humerus 09/28/2013   Right proximal humerus fracture 09/24/2013  . Delusion (HCC) 06/04/2013  . Herpes zoster with other nervous system complications(053.19) 09/12/2008  . Memory deficit   . Other and unspecified hyperlipidemia 07/10/2009  . Other malaise and fatigue 10/09/2009  . Rash and other nonspecific skin eruption 11/02/2007  . Senile osteoporosis 07/09/2006  . Shortness of breath 11/02/2007  . Unspecified constipation   . Unspecified essential hypertension 11/02/2007  . Unspecified hearing loss 12/05/2008  . Unspecified hypothyroidism 06/05/2005  . Unspecified vitamin D deficiency 02/03/2012   Past Surgical History:  Procedure Laterality Date  . BACK SURGERY  2006   Dr. Lequita Halt  . CATARACT EXTRACTION, BILATERAL    . KIDNEY STONE SURGERY  1959   Dr. Elige Radon  . LAMINECTOMY  03/20/2005   Centtal with Bil L3 & L4 nerve root decompression  Dr. Otelia Sergeant  . SPINE SURGERY  1991   Dr. Otelia Sergeant  . SPINE SURGERY  1996   Dr. Salli Real  . TOTAL KNEE ARTHROPLASTY Left 09/15/2002  . TOTAL KNEE ARTHROPLASTY  2000   Dr. Jerl Santos    Allergies  Allergen Reactions  . Codeine   . Darvocet [Propoxyphene N-Acetaminophen]   . Erythromycin   . Hydantoins   . Lyrica [Pregabalin]   . Morphine And Related   . Penicillins   . Percocet [Oxycodone-Acetaminophen]   .  Quaternium-15   . Sulfa Antibiotics   . Sulfites   . Tobramycin-Dexamethasone     Outpatient Encounter Prescriptions as of 02/13/2017  Medication Sig  . lactose free nutrition (BOOST) LIQD Take 237 mLs by mouth 2 (two) times daily between meals.  . venlafaxine (EFFEXOR) 50 MG tablet Take 50 mg by mouth 2 (two) times daily with a meal.  . acetaminophen (TYLENOL) 325 MG tablet Take 650 mg by mouth 3 (three) times daily.   . Cholecalciferol (VITAMIN D3) 2000 units  capsule Take 2,000 Units by mouth daily.  Marland Kitchen denosumab (PROLIA) 60 MG/ML SOLN injection Inject 60 mg into the skin once. Administer in upper arm, thigh, or abdomen  . donepezil (ARICEPT ODT) 10 MG disintegrating tablet Take 1 tablet (10 mg total) by mouth at bedtime.  Marland Kitchen levothyroxine (SYNTHROID, LEVOTHROID) 75 MCG tablet Take 75 mcg by mouth daily before breakfast.  . LORazepam (ATIVAN) 0.5 MG tablet Take 0.5 mg by mouth 2 (two) times daily. Ativan gel 0.5 mg (if not taking PO) topically q 8 hrs prn anxiety, agitation  . Melatonin 5 MG TABS Take 5 mg by mouth at bedtime.  . memantine (NAMENDA) 10 MG tablet Take 10 mg by mouth 2 (two) times daily. For dementia  . polyethylene glycol powder (GLYCOLAX/MIRALAX) powder Take 17 g by mouth daily.  . QUEtiapine (SEROQUEL) 50 MG tablet Take 75 mg by mouth 2 (two) times daily.   . sennosides-docusate sodium (SENOKOT-S) 8.6-50 MG tablet Take 2 tablets by mouth 2 (two) times daily.   . Valproate Sodium (DEPAKENE) 250 MG/5ML SOLN solution Take 375 mg by mouth 2 (two) times daily.   . [DISCONTINUED] calcium carbonate (TUMS - DOSED IN MG ELEMENTAL CALCIUM) 500 MG chewable tablet Chew 1 tablet by mouth 2 (two) times daily.  . [DISCONTINUED] oseltamivir (TAMIFLU) 75 MG capsule Take 75 mg by mouth daily. X 14 days for prophylaxis  . [DISCONTINUED] venlafaxine (EFFEXOR) 25 MG tablet Take 25 mg by mouth 2 (two) times daily with a meal.   No facility-administered encounter medications on file as of 02/13/2017.     Review of Systems  Unable to perform ROS: Dementia    Immunization History  Administered Date(s) Administered  . Influenza Inj Mdck Quad Pf 08/01/2016  . Influenza Whole 10/24/2011  . Influenza-Unspecified 07/27/2013, 07/19/2014, 07/27/2015  . PPD Test 06/19/2013  . Pneumococcal Conjugate-13 01/17/2015  . Zoster 05/05/2006   Pertinent  Health Maintenance Due  Topic Date Due  . PNA vac Low Risk Adult (2 of 2 - PPSV23) 01/17/2016  . INFLUENZA  VACCINE  05/14/2017  . DEXA SCAN  Completed   Fall Risk  08/17/2014  Falls in the past year? No  Risk for fall due to : History of fall(s)  Risk for fall due to (comments): 09-24-2013 broke right shoulder   Functional Status Survey:    Vitals:   02/13/17 1414  BP: 115/65  Pulse: (!) 56  Resp: 18  Temp: 97 F (36.1 C)  Weight: 143 lb (64.9 kg)   Body mass index is 27.02 kg/m.  Wt Readings from Last 3 Encounters:  02/13/17 143 lb (64.9 kg)  12/30/16 145 lb (65.8 kg)  12/17/16 145 lb (65.8 kg)   Physical Exam  Constitutional: No distress.  HENT:  Head: Normocephalic and atraumatic.  Neck: No JVD present.  Cardiovascular: Normal rate and regular rhythm.   No murmur heard. Pulmonary/Chest: Effort normal and breath sounds normal. No respiratory distress. She has no wheezes.  Neurological: She  is alert.  Oriented to self only. Difficulty follow commands  Skin: Skin is warm and dry. She is not diaphoretic.  Psychiatric: She has a normal mood and affect.    Labs reviewed:  Recent Labs  05/30/16 07/26/16 08/15/16 0248  NA 141 142 144  K 4.5 4.7 4.3  BUN 15 17 24*  CREATININE 0.7 0.9 0.8    Recent Labs  07/26/16 08/15/16 0248  AST 15 18  ALT 10 13  ALKPHOS 76 74    Recent Labs  05/30/16 07/26/16 08/15/16 0248  WBC 6.2 6.7 8.2  HGB 12.4 14.0 14.4  HCT 41 46 47*  PLT 295 296 221   Lab Results  Component Value Date   TSH 0.57 11/14/2016   Lab Results  Component Value Date   HGBA1C 5.6 03/07/2016   Lab Results  Component Value Date   CHOL 211 (A) 09/02/2015   HDL 47 09/02/2015   LDLCALC 136 09/02/2015   TRIG 140 09/02/2015    Significant Diagnostic Results in last 30 days:  No results found.  Assessment/Plan  1. Late onset Alzheimer's disease with behavioral disturbance Improved agitation per staff Consider tapering Depakote per Dr. Hebert Sohoeeds recommendation in the future but since she is doing so much better for my visit will hold off Continue  Aricept and Namenda for now Depakote level, CMP  2. Depression due to dementia Improved Continue Effexor 50 mg BID  3. Senile osteoporosis prolia on hold due to cost  4. Vitamin D deficiency Continue Vit D supplementation 2000 units qd  5. Slow transit constipation Continue Miralax 17 gram qd  6. Gait abnormality Due to advancing dementia and sedating meds Continue fall prec, use walker   7. Immunization due Tdap ordered   Family/ staff Communication: discussed with staff  Labs/tests ordered:  Depakote level, CMP

## 2017-02-14 LAB — BASIC METABOLIC PANEL
BUN: 23 mg/dL — AB (ref 4–21)
Creatinine: 0.7 mg/dL (ref 0.5–1.1)
Glucose: 82 mg/dL
Potassium: 3.8 mmol/L (ref 3.4–5.3)
Sodium: 145 mmol/L (ref 137–147)

## 2017-02-14 LAB — HEPATIC FUNCTION PANEL
ALT: 14 U/L (ref 7–35)
AST: 20 U/L (ref 13–35)
Alkaline Phosphatase: 83 U/L (ref 25–125)
Bilirubin, Total: 0.3 mg/dL

## 2017-02-17 DIAGNOSIS — R269 Unspecified abnormalities of gait and mobility: Secondary | ICD-10-CM | POA: Insufficient documentation

## 2017-03-04 ENCOUNTER — Non-Acute Institutional Stay: Payer: Medicare Other | Admitting: Internal Medicine

## 2017-03-04 ENCOUNTER — Encounter: Payer: Self-pay | Admitting: Internal Medicine

## 2017-03-04 DIAGNOSIS — F32A Depression, unspecified: Secondary | ICD-10-CM

## 2017-03-04 DIAGNOSIS — F0392 Unspecified dementia, unspecified severity, with psychotic disturbance: Secondary | ICD-10-CM

## 2017-03-04 DIAGNOSIS — R269 Unspecified abnormalities of gait and mobility: Secondary | ICD-10-CM

## 2017-03-04 DIAGNOSIS — M810 Age-related osteoporosis without current pathological fracture: Secondary | ICD-10-CM

## 2017-03-04 DIAGNOSIS — E038 Other specified hypothyroidism: Secondary | ICD-10-CM

## 2017-03-04 DIAGNOSIS — G301 Alzheimer's disease with late onset: Secondary | ICD-10-CM

## 2017-03-04 DIAGNOSIS — F0391 Unspecified dementia with behavioral disturbance: Secondary | ICD-10-CM

## 2017-03-04 DIAGNOSIS — F0281 Dementia in other diseases classified elsewhere with behavioral disturbance: Secondary | ICD-10-CM

## 2017-03-04 DIAGNOSIS — F0393 Unspecified dementia, unspecified severity, with mood disturbance: Secondary | ICD-10-CM

## 2017-03-04 DIAGNOSIS — F02818 Dementia in other diseases classified elsewhere, unspecified severity, with other behavioral disturbance: Secondary | ICD-10-CM

## 2017-03-04 DIAGNOSIS — F0280 Dementia in other diseases classified elsewhere without behavioral disturbance: Secondary | ICD-10-CM

## 2017-03-04 DIAGNOSIS — F329 Major depressive disorder, single episode, unspecified: Secondary | ICD-10-CM

## 2017-03-04 NOTE — Progress Notes (Signed)
Patient ID: [name], female   DOB: 03/26/1927, 83 y.o.   MRN: [id number]  Location:  [facility] Room Number: 307 memory care Place of Service:  SNF (31) Provider:   ,  L, DO  Patient Care Team: ,  L, DO as PCP - General (Geriatric Medicine) [facility] Nitka, James E, MD as Consulting Physician (Orthopedic Surgery) Plovsky, Gerald, MD as Consulting Physician (Psychiatry)  Extended Emergency Contact Information Primary Emergency Contact: [name],[name] Address: [address]          [place] [address] [place] Home Phone: [phone] Mobile Phone: [phone] Relation: None  Code Status:  DNR Goals of care: Advanced Directive information Advanced Directives 03/04/2017  Does Patient Have a Medical Advance Directive? Yes  Type of Advance Directive Out of facility DNR (pink MOST or yellow form);Healthcare Power of Attorney;Living will  Copy of Healthcare Power of Attorney in Chart? Yes  Pre-existing out of facility DNR order (yellow form or pink MOST form) Yellow form placed in chart (order not valid for inpatient use)   Chief Complaint  Patient presents with  . Medical Management of Chronic Issues    routine visit   HPI:  Pt is a 83 y.o. female seen today for medical management of chronic diseases including AD with behaviors, depression, dysphagia, falls and gait disturbance, osteoporosis (unable to do bone densities and daughter has decided to hold off on prolia with her declining status--had been receiving with last in December '17 so likely deductible hasn't been met or it was run through the wrong insurance-is under part B not D).  Dysphagia has been worsening with Ca d/c'd and depression meds changed as a result.  Oddly, when I saw her, staff told me her crying was no better and her agitation worse since the pristiq was changed to effexor.  NP had been told the opposite  last routine visit.  Her effexor was increased to 75mg po bid on 5/10 and her depakote reduced to 250mg po bid (was on tid dose which seemed like a lot).  She remains on aricept and namenda.  TSH was done 11/14/16 which was 0.57, finally in normal range.  BMP and hepatic panel 5/4 were normal.  Tdap was ordered on 5/3.  5/4 depakote level was 40.  Last MMSE was 2/30 on 08/26/16.   When seen, she was mumbling to herself, not tearful, not agitated.  Weight is trending down at 142 lbs, down 14 lbs in 6 mos 156 in Oct).  Max weight was 170 lbs in feb '17.     Past Medical History:  Diagnosis Date  . Backache, unspecified 04/10/2009  . Cervicalgia 09/12/2005  . Closed fracture of unspecified part of upper end of humerus 09/28/2013   Right proximal humerus fracture 09/24/2013  . Delusion (HCC) 06/04/2013  . Herpes zoster with other nervous system complications(053.19) 09/12/2008  . Memory deficit   . Other and unspecified hyperlipidemia 07/10/2009  . Other malaise and fatigue 10/09/2009  . Rash and other nonspecific skin eruption 11/02/2007  . Senile osteoporosis 07/09/2006  . Shortness of breath 11/02/2007  . Unspecified constipation   . Unspecified essential hypertension 11/02/2007  . Unspecified hearing loss 12/05/2008  . Unspecified hypothyroidism 06/05/2005  . Unspecified vitamin D deficiency 02/03/2012   Past Surgical History:  Procedure Laterality Date  . BACK SURGERY  2006   Dr. Aluisio  . CATARACT EXTRACTION, BILATERAL    . KIDNEY STONE SURGERY    43   Dr. Leory Plowman  . LAMINECTOMY  03/20/2005   Centtal with Bil L3 & L4 nerve root decompression  Dr. Louanne Skye  . SPINE SURGERY  1991   Dr. Louanne Skye  . SPINE SURGERY  1996   Dr. Kirstie Mirza  . TOTAL KNEE ARTHROPLASTY Left 09/15/2002  . TOTAL KNEE ARTHROPLASTY  2000   Dr. Rhona Raider    Allergies  Allergen Reactions  . Codeine   . Darvocet [Propoxyphene N-Acetaminophen]   . Erythromycin   . Hydantoins   . Lyrica [Pregabalin]   . Morphine And Related     . Penicillins   . Percocet [Oxycodone-Acetaminophen]   . Quaternium-15   . Sulfa Antibiotics   . Sulfites   . Tobramycin-Dexamethasone     Outpatient Encounter Prescriptions as of 03/04/2017  Medication Sig  . acetaminophen (TYLENOL) 325 MG tablet Take 650 mg by mouth 3 (three) times daily.   . Cholecalciferol (VITAMIN D3) 2000 units capsule Take 2,000 Units by mouth daily.  Marland Kitchen denosumab (PROLIA) 60 MG/ML SOLN injection Inject 60 mg into the skin once. Administer in upper arm, thigh, or abdomen  . donepezil (ARICEPT ODT) 10 MG disintegrating tablet Take 1 tablet (10 mg total) by mouth at bedtime.  . lactose free nutrition (BOOST) LIQD Take 237 mLs by mouth 2 (two) times daily between meals.  Marland Kitchen levothyroxine (SYNTHROID, LEVOTHROID) 75 MCG tablet Take 75 mcg by mouth daily before breakfast.  . LORazepam (ATIVAN) 0.5 MG tablet Take 0.5 mg by mouth 2 (two) times daily. Ativan gel 0.5 mg (if not taking PO) topically q 8 hrs prn anxiety, agitation  . Melatonin 5 MG TABS Take 5 mg by mouth at bedtime.  . memantine (NAMENDA) 10 MG tablet Take 10 mg by mouth 2 (two) times daily. For dementia  . polyethylene glycol powder (GLYCOLAX/MIRALAX) powder Take 17 g by mouth daily.  . QUEtiapine (SEROQUEL) 50 MG tablet Take 75 mg by mouth 2 (two) times daily.   . sennosides-docusate sodium (SENOKOT-S) 8.6-50 MG tablet Take 2 tablets by mouth 2 (two) times daily.   . Valproate Sodium (DEPAKENE) 250 MG/5ML SOLN solution Take 250 mg by mouth 2 (two) times daily.   Marland Kitchen venlafaxine (EFFEXOR) 75 MG tablet Take 75 mg by mouth 2 (two) times daily.  . [DISCONTINUED] venlafaxine (EFFEXOR) 50 MG tablet Take 50 mg by mouth 2 (two) times daily with a meal.   No facility-administered encounter medications on file as of 03/04/2017.     Review of Systems  Constitutional: Negative for activity change, appetite change, chills and fever.  HENT: Negative for congestion.   Eyes:       Glasses  Respiratory: Negative for chest  tightness and shortness of breath.   Cardiovascular: Negative for chest pain and leg swelling.  Gastrointestinal: Negative for abdominal pain.  Genitourinary: Negative for dysuria.  Musculoskeletal: Positive for arthralgias and gait problem.  Neurological: Positive for weakness. Negative for dizziness.  Hematological: Does not bruise/bleed easily.  Psychiatric/Behavioral: Positive for agitation, behavioral problems, confusion and hallucinations. Negative for sleep disturbance. The patient is not nervous/anxious.     Immunization History  Administered Date(s) Administered  . Influenza Inj Mdck Quad Pf 08/01/2016  . Influenza Whole 10/24/2011  . Influenza-Unspecified 07/27/2013, 07/19/2014, 07/27/2015  . PPD Test 06/19/2013  . Pneumococcal Conjugate-13 01/17/2015  . Zoster 05/05/2006   Pertinent  Health Maintenance Due  Topic Date Due  . PNA vac Low Risk Adult (2 of 2 - PPSV23) 01/17/2016  . INFLUENZA VACCINE  05/14/2017  .  DEXA SCAN  Completed   Fall Risk  08/17/2014  Falls in the past year? No  Risk for fall due to : History of fall(s)  Risk for fall due to (comments): 09-24-2013 broke right shoulder   Functional Status Survey:   dependent except some feeding herself  Vitals:   03/04/17 1307  BP: (!) 150/71  Pulse: 62  Resp: 20  Temp: 97.1 F (36.2 C)  TempSrc: Oral  SpO2: 96%  Weight: 142 lb (64.4 kg)   Body mass index is 26.83 kg/m. Physical Exam  Constitutional: No distress.  Cardiovascular: Normal rate, regular rhythm, normal heart sounds and intact distal pulses.   Pulmonary/Chest: Effort normal and breath sounds normal. No respiratory distress.  Abdominal: Soft. Bowel sounds are normal.  Musculoskeletal: Normal range of motion.  To be using walker but forgets  Neurological:  Difficult to get her into this world--clearly distracted  Skin: Skin is warm and dry. There is pallor.  Psychiatric:  Talking to herself or something she sees    Labs  reviewed:  Recent Labs  07/26/16 08/15/16 0248 02/14/17 0600  NA 142 144 145  K 4.7 4.3 3.8  BUN 17 24* 23*  CREATININE 0.9 0.8 0.7    Recent Labs  07/26/16 08/15/16 0248 02/14/17 0600  AST 15 18 20  ALT 10 13 14  ALKPHOS 76 74 83    Recent Labs  05/30/16 07/26/16 08/15/16 0248  WBC 6.2 6.7 8.2  HGB 12.4 14.0 14.4  HCT 41 46 47*  PLT 295 296 221   Lab Results  Component Value Date   TSH 0.57 11/14/2016   Lab Results  Component Value Date   HGBA1C 5.6 03/07/2016   Lab Results  Component Value Date   CHOL 211 (A) 09/02/2015   HDL 47 09/02/2015   LDLCALC 136 09/02/2015   TRIG 140 09/02/2015    Assessment/Plan 1. Late onset Alzheimer's disease with behavioral disturbance -end stage with weight loss, failure to thrive, difficulty ambulating now, psychosis -would favor tapering off aricept, then namenda due to lack of benefit at this point, plus the aricept can promote weight loss  2. Depression due to dementia -cont on effexor 75mg po bid, but does not seem to be working very well to me--need something that she is able to swallow which limits our options--suggested Dr. Plovsky see her to DON  3. Senile osteoporosis -cont vitamin D, is walking less as dementia progressing and prolia not affordable now, bisphosphonates not an option with her dysphagia -no longer getting bone densities  4. Other specified hypothyroidism -last tsh was finally normal, cont levothyroxine  5. Gait abnormality -falling more and more unsteady as she is forgetting how to walk and dementia progresses, not helped by meds but she is not always redirectable so meds have had to be used to calm her when she is terribly upset and tearful or overtly psychotic  6. Dementia with psychosis -clearly having hallucinations most times I've seen her including today--talking away "to herself" -continues on seroquel 75mg po bid -get agitated and upset possibly due to these hallucinations  -also has  ativan to calm her bid  Family/ staff Communication: discussed with memory care staff Labs/tests ordered:  No new   L. , D.O. Geriatrics [facility] [address] [place], [place] [address] Cell Phone (Mon-Fri 8am-5pm):  [phone] On Call:  [phone] & follow prompts after 5pm & weekends Office Phone:  [phone] Office Fax:  [phone]    

## 2017-04-07 ENCOUNTER — Non-Acute Institutional Stay: Payer: Medicare Other | Admitting: Adult Health

## 2017-04-07 ENCOUNTER — Encounter: Payer: Self-pay | Admitting: Adult Health

## 2017-04-07 DIAGNOSIS — F0280 Dementia in other diseases classified elsewhere without behavioral disturbance: Secondary | ICD-10-CM

## 2017-04-07 DIAGNOSIS — F02818 Dementia in other diseases classified elsewhere, unspecified severity, with other behavioral disturbance: Secondary | ICD-10-CM

## 2017-04-07 DIAGNOSIS — F0393 Unspecified dementia, unspecified severity, with mood disturbance: Secondary | ICD-10-CM

## 2017-04-07 DIAGNOSIS — G301 Alzheimer's disease with late onset: Secondary | ICD-10-CM | POA: Diagnosis not present

## 2017-04-07 DIAGNOSIS — R634 Abnormal weight loss: Secondary | ICD-10-CM | POA: Diagnosis not present

## 2017-04-07 DIAGNOSIS — F329 Major depressive disorder, single episode, unspecified: Secondary | ICD-10-CM

## 2017-04-07 DIAGNOSIS — Z7189 Other specified counseling: Secondary | ICD-10-CM | POA: Diagnosis not present

## 2017-04-07 DIAGNOSIS — F0281 Dementia in other diseases classified elsewhere with behavioral disturbance: Secondary | ICD-10-CM

## 2017-04-07 NOTE — Progress Notes (Signed)
Location:  Medical illustrator of Service:  ALF (13) Provider:   Peggye Ley, ANP [facility] [phone]   Kermit Balo, DO  Patient Care Team: Kermit Balo, DO as PCP - General (Geriatric Medicine) Community, [facility] Venancio Poisson, Guy Sandifer, MD as Consulting Physician (Orthopedic Surgery) Archer Asa, MD as Consulting Physician (Psychiatry)  Extended Emergency Contact Information Primary Emergency Contact: [name],[name] Address: [phone]          [place] [address] [place] Home Phone: [phone] Mobile Phone: [phone] Relation: None  Code Status:  DNR Goals of care: Advanced Directive information Advanced Directives 04/07/2017  Does Patient Have a Medical Advance Directive? Yes  Type of Advance Directive Out of facility DNR (pink MOST or yellow form);Healthcare Power of [place];Living will  Copy of Healthcare Power of Attorney in Chart? Yes  Pre-existing out of facility DNR order (yellow form or pink MOST form) Yellow form placed in chart (order not valid for inpatient use)     Chief Complaint  Patient presents with  . Acute Visit    weight loss, refusing  meds    HPI:  Pt is a 83 y.o. female seen today for an acute visit for weight loss and refusing medication.  [name] resides in the memory care unit due to advanced dementia. She is ambulatory and spends much of the day pacing. She is not able to answer q's but is verbal. Staff report that she has not had any medications for 3 days due to refusal and is not eating or drinking. She is easily agitated and can become physical with staff. Over the past few months staff have noted tearful episodes and her medications have been adjusted but apparently they are not able to consistently get them in her to see improvement. Her weight has trended down from 154 lbs in Dec to 139.8 lbs in June. Her vital signs are stable and there is no outward  sign of infection such as cough, dysuria, fever, etc. The staff are using ativan gel at least 1 x per day for the past 5 days to help with tearful episodes. For my visit she resisted my exam and walked away. I later was able to approach her a second time and she let me perform most of my exam.   Past Medical History:  Diagnosis Date  . Backache, unspecified 04/10/2009  . Cervicalgia 09/12/2005  . Closed fracture of unspecified part of upper end of humerus 09/28/2013   Right proximal humerus fracture 09/24/2013  . Delusion (HCC) 06/04/2013  . Herpes zoster with other nervous system complications(053.19) 09/12/2008  . Memory deficit   . Other and unspecified hyperlipidemia 07/10/2009  . Other malaise and fatigue 10/09/2009  . Rash and other nonspecific skin eruption 11/02/2007  . Senile osteoporosis 07/09/2006  . Shortness of breath 11/02/2007  . Unspecified constipation   . Unspecified essential hypertension 11/02/2007  . Unspecified hearing loss 12/05/2008  . Unspecified hypothyroidism 06/05/2005  . Unspecified vitamin D deficiency 02/03/2012   Past Surgical History:  Procedure Laterality Date  . BACK SURGERY  2006   Dr. Lequita Halt  . CATARACT EXTRACTION, BILATERAL    . KIDNEY STONE SURGERY  1959   Dr. Elige Radon  . LAMINECTOMY  03/20/2005   Centtal with Bil L3 & L4 nerve root decompression  Dr. Otelia Sergeant  . SPINE SURGERY  1991   Dr. Otelia Sergeant  . SPINE SURGERY  1996   Dr. Salli Real  .  TOTAL KNEE ARTHROPLASTY Left 09/15/2002  . TOTAL KNEE ARTHROPLASTY  2000   Dr. Jerl Santosalldorf    Allergies  Allergen Reactions  . Codeine   . Darvocet [Propoxyphene N-Acetaminophen]   . Erythromycin   . Hydantoins   . Lyrica [Pregabalin]   . Morphine And Related   . Penicillins   . Percocet [Oxycodone-Acetaminophen]   . Quaternium-15   . Sulfa Antibiotics   . Sulfites   . Tobramycin-Dexamethasone     Outpatient Encounter Prescriptions as of 04/07/2017  Medication Sig  . acetaminophen (TYLENOL) 325 MG tablet Take  650 mg by mouth 3 (three) times daily.   . Cholecalciferol (VITAMIN D3) 2000 units capsule Take 2,000 Units by mouth daily.  Marland Kitchen. denosumab (PROLIA) 60 MG/ML SOLN injection Inject 60 mg into the skin once. Administer in upper arm, thigh, or abdomen  . donepezil (ARICEPT ODT) 10 MG disintegrating tablet Take 1 tablet (10 mg total) by mouth at bedtime.  . lactose free nutrition (BOOST) LIQD Take 237 mLs by mouth 2 (two) times daily between meals.  Marland Kitchen. levothyroxine (SYNTHROID, LEVOTHROID) 75 MCG tablet Take 75 mcg by mouth daily before breakfast.  . LORazepam (ATIVAN) 0.5 MG tablet Take 0.5 mg by mouth 2 (two) times daily. Ativan gel 0.5 mg (if not taking PO) topically q 8 hrs prn anxiety, agitation  . Melatonin 5 MG TABS Take 5 mg by mouth at bedtime.  . memantine (NAMENDA) 10 MG tablet Take 10 mg by mouth 2 (two) times daily. For dementia  . polyethylene glycol powder (GLYCOLAX/MIRALAX) powder Take 17 g by mouth daily.  . QUEtiapine (SEROQUEL) 50 MG tablet Take 75 mg by mouth 2 (two) times daily.   . sennosides-docusate sodium (SENOKOT-S) 8.6-50 MG tablet Take 2 tablets by mouth 2 (two) times daily.   . Valproate Sodium (DEPAKENE) 250 MG/5ML SOLN solution Take 250 mg by mouth 2 (two) times daily.   Marland Kitchen. venlafaxine (EFFEXOR) 75 MG tablet Take 75 mg by mouth 2 (two) times daily.   No facility-administered encounter medications on file as of 04/07/2017.     Review of Systems  Unable to perform ROS: Dementia    Immunization History  Administered Date(s) Administered  . Influenza Inj Mdck Quad Pf 08/01/2016  . Influenza Whole 10/24/2011  . Influenza-Unspecified 07/27/2013, 07/19/2014, 07/27/2015  . PPD Test 06/19/2013  . Pneumococcal Conjugate-13 01/17/2015  . Zoster 05/05/2006   Pertinent  Health Maintenance Due  Topic Date Due  . PNA vac Low Risk Adult (2 of 2 - PPSV23) 01/17/2016  . INFLUENZA VACCINE  05/14/2017  . DEXA SCAN  Completed   Fall Risk  08/17/2014  Falls in the past year? No    Risk for fall due to : History of fall(s)  Risk for fall due to (comments): 09-24-2013 broke right shoulder   Functional Status Survey:    Vitals:   04/07/17 1120  BP: 118/63  Pulse: (!) 55  Resp: 20  Temp: 97.9 F (36.6 C)  SpO2: 96%   There is no height or weight on file to calculate BMI. Physical Exam  Constitutional: No distress.  HENT:  Head: Normocephalic and atraumatic.  Right Ear: External ear normal.  Left Ear: External ear normal.  Nose: Nose normal.  Refused oral exam  Neck: No JVD present.  Cardiovascular: Normal rate and regular rhythm.   No murmur heard. Pulmonary/Chest: Effort normal and breath sounds normal. No respiratory distress. She has no wheezes.  Abdominal: Soft. Bowel sounds are normal. She exhibits no distension.  Musculoskeletal: She exhibits no edema or tenderness.  Neurological: She is alert.  No obvious deficit, difficult following commands. Not oriented.  Skin: Skin is warm and dry. She is not diaphoretic.  Psychiatric:  Tearful    Labs reviewed:  Recent Labs  07/26/16 08/15/16 0248 02/14/17 0600  NA 142 144 145  K 4.7 4.3 3.8  BUN 17 24* 23*  CREATININE 0.9 0.8 0.7    Recent Labs  07/26/16 08/15/16 0248 02/14/17 0600  AST 15 18 20   ALT 10 13 14   ALKPHOS 76 74 83    Recent Labs  05/30/16 07/26/16 08/15/16 0248  WBC 6.2 6.7 8.2  HGB 12.4 14.0 14.4  HCT 41 46 47*  PLT 295 296 221   Lab Results  Component Value Date   TSH 0.57 11/14/2016   Lab Results  Component Value Date   HGBA1C 5.6 03/07/2016   Lab Results  Component Value Date   CHOL 211 (A) 09/02/2015   HDL 47 09/02/2015   LDLCALC 136 09/02/2015   TRIG 140 09/02/2015    Significant Diagnostic Results in last 30 days:  No results found.  Assessment/Plan  1. Weight loss Due to progressive dementia with behaviors Most recent TSH in Feb ok Refuses supplements Continue to monitor intake/weight and encourage supplements during times of calm  behavior  2. Late onset Alzheimer's disease with behavioral disturbance Progressive cognitive losses Try giving ativan gel prior to other meds to see if this will help calm her down to take them  3. Depression due to dementia No real improvement in tearful episodes with adjustment of Effexor in May.  I recommend the staff continue to use the ativan gel when their are periods of tearfulness and encourage her to take her pills when she is calm.    4. Advanced care planning I spoke with her daughter, and we both agree that Ms. Rog has declined cognitively in regards to memory and agitation, as well as her nutritional status (weight loss).  We agreed that Ms Wycoff would not tolerate labs, IVs, or trips to the hospital.  I filled out a most form indicating no IVs, DNR, no feeding tubes, determine the use of antibiotics depending on the situation.  I placed a hospice consult due to weight loss, dementia, and depression.  She was appreciative of the time I spent with her and verbalized understanding that Ms. Palen is nearing the end of life and should not receive aggressive care. We will focus on her dignity and comfort.  30 min spent in counseling, coordination, and discussion with HCPOA and staff  Peggye Ley, ANP [facility] [phone]

## 2017-04-13 DIAGNOSIS — F0392 Unspecified dementia, unspecified severity, with psychotic disturbance: Secondary | ICD-10-CM | POA: Insufficient documentation

## 2017-04-13 DIAGNOSIS — F0391 Unspecified dementia with behavioral disturbance: Secondary | ICD-10-CM | POA: Insufficient documentation

## 2017-04-17 DIAGNOSIS — E46 Unspecified protein-calorie malnutrition: Secondary | ICD-10-CM | POA: Diagnosis not present

## 2017-04-17 DIAGNOSIS — I10 Essential (primary) hypertension: Secondary | ICD-10-CM | POA: Diagnosis not present

## 2017-04-17 DIAGNOSIS — E039 Hypothyroidism, unspecified: Secondary | ICD-10-CM | POA: Diagnosis not present

## 2017-04-17 DIAGNOSIS — G309 Alzheimer's disease, unspecified: Secondary | ICD-10-CM | POA: Diagnosis not present

## 2017-04-17 DIAGNOSIS — E785 Hyperlipidemia, unspecified: Secondary | ICD-10-CM | POA: Diagnosis not present

## 2017-04-18 DIAGNOSIS — E785 Hyperlipidemia, unspecified: Secondary | ICD-10-CM | POA: Diagnosis not present

## 2017-04-18 DIAGNOSIS — E039 Hypothyroidism, unspecified: Secondary | ICD-10-CM | POA: Diagnosis not present

## 2017-04-18 DIAGNOSIS — E46 Unspecified protein-calorie malnutrition: Secondary | ICD-10-CM | POA: Diagnosis not present

## 2017-04-18 DIAGNOSIS — G309 Alzheimer's disease, unspecified: Secondary | ICD-10-CM | POA: Diagnosis not present

## 2017-04-18 DIAGNOSIS — I10 Essential (primary) hypertension: Secondary | ICD-10-CM | POA: Diagnosis not present

## 2017-04-23 DIAGNOSIS — E039 Hypothyroidism, unspecified: Secondary | ICD-10-CM | POA: Diagnosis not present

## 2017-04-23 DIAGNOSIS — E46 Unspecified protein-calorie malnutrition: Secondary | ICD-10-CM | POA: Diagnosis not present

## 2017-04-23 DIAGNOSIS — I10 Essential (primary) hypertension: Secondary | ICD-10-CM | POA: Diagnosis not present

## 2017-04-23 DIAGNOSIS — G309 Alzheimer's disease, unspecified: Secondary | ICD-10-CM | POA: Diagnosis not present

## 2017-04-23 DIAGNOSIS — E785 Hyperlipidemia, unspecified: Secondary | ICD-10-CM | POA: Diagnosis not present

## 2017-04-30 DIAGNOSIS — G309 Alzheimer's disease, unspecified: Secondary | ICD-10-CM | POA: Diagnosis not present

## 2017-04-30 DIAGNOSIS — E785 Hyperlipidemia, unspecified: Secondary | ICD-10-CM | POA: Diagnosis not present

## 2017-04-30 DIAGNOSIS — E039 Hypothyroidism, unspecified: Secondary | ICD-10-CM | POA: Diagnosis not present

## 2017-04-30 DIAGNOSIS — E46 Unspecified protein-calorie malnutrition: Secondary | ICD-10-CM | POA: Diagnosis not present

## 2017-04-30 DIAGNOSIS — I10 Essential (primary) hypertension: Secondary | ICD-10-CM | POA: Diagnosis not present

## 2017-05-02 DIAGNOSIS — G309 Alzheimer's disease, unspecified: Secondary | ICD-10-CM | POA: Diagnosis not present

## 2017-05-02 DIAGNOSIS — E039 Hypothyroidism, unspecified: Secondary | ICD-10-CM | POA: Diagnosis not present

## 2017-05-02 DIAGNOSIS — E46 Unspecified protein-calorie malnutrition: Secondary | ICD-10-CM | POA: Diagnosis not present

## 2017-05-02 DIAGNOSIS — I10 Essential (primary) hypertension: Secondary | ICD-10-CM | POA: Diagnosis not present

## 2017-05-02 DIAGNOSIS — E785 Hyperlipidemia, unspecified: Secondary | ICD-10-CM | POA: Diagnosis not present

## 2017-05-06 ENCOUNTER — Non-Acute Institutional Stay (SKILLED_NURSING_FACILITY): Payer: Medicare Other | Admitting: Internal Medicine

## 2017-05-06 DIAGNOSIS — R269 Unspecified abnormalities of gait and mobility: Secondary | ICD-10-CM | POA: Diagnosis not present

## 2017-05-06 DIAGNOSIS — L97511 Non-pressure chronic ulcer of other part of right foot limited to breakdown of skin: Secondary | ICD-10-CM

## 2017-05-06 DIAGNOSIS — F0392 Unspecified dementia, unspecified severity, with psychotic disturbance: Secondary | ICD-10-CM

## 2017-05-06 DIAGNOSIS — F0391 Unspecified dementia with behavioral disturbance: Secondary | ICD-10-CM | POA: Diagnosis not present

## 2017-05-06 DIAGNOSIS — F0280 Dementia in other diseases classified elsewhere without behavioral disturbance: Secondary | ICD-10-CM | POA: Diagnosis not present

## 2017-05-06 DIAGNOSIS — F329 Major depressive disorder, single episode, unspecified: Secondary | ICD-10-CM

## 2017-05-06 DIAGNOSIS — F0393 Unspecified dementia, unspecified severity, with mood disturbance: Secondary | ICD-10-CM

## 2017-05-06 DIAGNOSIS — F32A Depression, unspecified: Secondary | ICD-10-CM

## 2017-05-06 DIAGNOSIS — R627 Adult failure to thrive: Secondary | ICD-10-CM | POA: Diagnosis not present

## 2017-05-06 DIAGNOSIS — K5901 Slow transit constipation: Secondary | ICD-10-CM | POA: Diagnosis not present

## 2017-05-06 DIAGNOSIS — L97521 Non-pressure chronic ulcer of other part of left foot limited to breakdown of skin: Secondary | ICD-10-CM | POA: Diagnosis not present

## 2017-05-06 NOTE — Progress Notes (Signed)
Patient ID: [name], female   DOB: Nov 14, 1926, 83 y.o.   MRN: [id number]  Location:  [facility] Room Number: 307 memory care Place of Service:  SNF [id number]) Provider:   Kermit Balo, DO  Patient Care Team: Kermit Balo, DO as PCP - General (Geriatric Medicine) Community, Well Venancio Poisson, Guy Sandifer, MD as Consulting Physician (Orthopedic Surgery) Archer Asa, MD as Consulting Physician (Psychiatry)  Extended Emergency Contact Information Primary Emergency Contact: [name],[name] Address: [phone]          [place] [address] [place] Home Phone: [phone] Mobile Phone: [phone] Relation: None  Code Status:  DNR, hospice Goals of care: Advanced Directive information Advanced Directives 05/06/2017  Does Patient Have a Medical Advance Directive? Yes  Type of Advance Directive Out of facility DNR (pink MOST or yellow form);Healthcare Power of [place];Living will  Copy of Healthcare Power of Attorney in Chart? Yes  Pre-existing out of facility DNR order (yellow form or pink MOST form) Yellow form placed in chart (order not valid for inpatient use);Pink MOST form placed in chart (order not valid for inpatient use)     Chief Complaint  Patient presents with  . Medical Management of Chronic Issues    routine visit    HPI:  Pt is a 83 y.o. female seen today for medical management of chronic diseases.    She has had failure to thrive with poor po intake.  Lives in memory care for end stages of dementia and was recently admitted to hospice care.  She actually ate a small amount this am at breakfast, but not much at lunch. She is dependent in all ADLs.  Continues to hallucinate and talk to herself.  Appears down and depressed regardless of medications for a long time now.  Has a subungal ulcer on her right great toe for which she saw Dr. Jeanice Lim who ordered cleansings and dressings for it.  She  has been constipated with no bm in 4 days. She received dulcolax with benefit.    She also had a fall and was found on the floor of another resident's room.  No injury.  She is unsteady and does not remember to use any assistive device on her own.     Past Medical History:  Diagnosis Date  . Backache, unspecified 04/10/2009  . Cervicalgia 09/12/2005  . Closed fracture of unspecified part of upper end of humerus 09/28/2013   Right proximal humerus fracture 09/24/2013  . Delusion (HCC) 06/04/2013  . Herpes zoster with other nervous system complications(053.19) 09/12/2008  . Memory deficit   . Other and unspecified hyperlipidemia 07/10/2009  . Other malaise and fatigue 10/09/2009  . Rash and other nonspecific skin eruption 11/02/2007  . Senile osteoporosis 07/09/2006  . Shortness of breath 11/02/2007  . Unspecified constipation   . Unspecified essential hypertension 11/02/2007  . Unspecified hearing loss 12/05/2008  . Unspecified hypothyroidism 06/05/2005  . Unspecified vitamin D deficiency 02/03/2012   Past Surgical History:  Procedure Laterality Date  . BACK SURGERY  2006   Dr. Lequita Halt  . CATARACT EXTRACTION, BILATERAL    . KIDNEY STONE SURGERY  1959   Dr. Elige Radon  . LAMINECTOMY  03/20/2005   Centtal with Bil L3 & L4 nerve root decompression  Dr. Otelia Sergeant  . SPINE SURGERY  1991   Dr. Otelia Sergeant  . SPINE SURGERY  1996   Dr. Salli Real  . TOTAL KNEE ARTHROPLASTY Left 09/15/2002  . TOTAL  KNEE ARTHROPLASTY  2000   Dr. Jerl Santosalldorf    Allergies  Allergen Reactions  . Codeine   . Darvocet [Propoxyphene N-Acetaminophen]   . Erythromycin   . Hydantoins   . Lyrica [Pregabalin]   . Morphine And Related   . Penicillins   . Percocet [Oxycodone-Acetaminophen]   . Quaternium-15   . Sulfa Antibiotics   . Sulfites   . Tobramycin-Dexamethasone     Outpatient Encounter Prescriptions as of 05/06/2017  Medication Sig  . acetaminophen (TYLENOL) 160 MG/5ML suspension Take 20.3 mg by mouth 2 (two) times daily.   . bisacodyl (DULCOLAX) 10 MG suppository Place 10 mg rectally as needed for moderate constipation.  . Cholecalciferol (VITAMIN D3) 2000 units capsule Take 2,000 Units by mouth daily.  Marland Kitchen. denosumab (PROLIA) 60 MG/ML SOLN injection Inject 60 mg into the skin every 6 (six) months. Administer in upper arm, thigh, or abdomen  . lactose free nutrition (BOOST) LIQD Take 237 mLs by mouth 2 (two) times daily between meals.  Marland Kitchen. levothyroxine (SYNTHROID, LEVOTHROID) 75 MCG tablet Take 75 mcg by mouth daily before breakfast.  . LORazepam (ATIVAN) 0.5 MG tablet Take 0.5 mg by mouth 2 (two) times daily. Ativan gel 0.5 mg (if not taking PO) topically q 8 hrs prn anxiety, agitation  . Melatonin 5 MG TABS Take 5 mg by mouth at bedtime.  . polyethylene glycol powder (GLYCOLAX/MIRALAX) powder Take 17 g by mouth daily.  . QUEtiapine (SEROQUEL) 50 MG tablet Take 75 mg by mouth 2 (two) times daily.   . sennosides-docusate sodium (SENOKOT-S) 8.6-50 MG tablet Take 2 tablets by mouth 2 (two) times daily.   . Valproate Sodium (DEPAKENE) 250 MG/5ML SOLN solution Take 250 mg by mouth 2 (two) times daily.   Marland Kitchen. venlafaxine (EFFEXOR) 75 MG tablet Take 75 mg by mouth 2 (two) times daily.  . [DISCONTINUED] acetaminophen (TYLENOL) 325 MG tablet Take 650 mg by mouth 3 (three) times daily.   . [DISCONTINUED] donepezil (ARICEPT ODT) 10 MG disintegrating tablet Take 1 tablet (10 mg total) by mouth at bedtime.  . [DISCONTINUED] memantine (NAMENDA) 10 MG tablet Take 10 mg by mouth 2 (two) times daily. For dementia   No facility-administered encounter medications on file as of 05/06/2017.     Review of Systems  Reason unable to perform ROS: per nursing.  Constitutional: Positive for appetite change, fatigue and unexpected weight change. Negative for activity change, chills and fever.  HENT: Negative for congestion.   Eyes: Positive for visual disturbance.  Respiratory: Negative for chest tightness and shortness of breath.     Gastrointestinal: Positive for constipation. Negative for abdominal pain.  Genitourinary: Positive for enuresis.  Musculoskeletal: Positive for gait problem.       Fall  Neurological: Positive for weakness.  Psychiatric/Behavioral: Positive for agitation, behavioral problems, confusion and hallucinations.    Immunization History  Administered Date(s) Administered  . Influenza Inj Mdck Quad Pf 08/01/2016  . Influenza Whole 10/24/2011  . Influenza-Unspecified 07/27/2013, 07/19/2014, 07/27/2015  . PPD Test 06/19/2013  . Pneumococcal Conjugate-13 01/17/2015  . Zoster 05/05/2006   Pertinent  Health Maintenance Due  Topic Date Due  . PNA vac Low Risk Adult (2 of 2 - PPSV23) 01/17/2016  . INFLUENZA VACCINE  05/14/2017  . DEXA SCAN  Completed   Fall Risk  08/17/2014  Falls in the past year? No  Risk for fall due to : History of fall(s)  Risk for fall due to (comments): 09-24-2013 broke right shoulder   Functional Status Survey:  There were no vitals filed for this visit. There is no height or weight on file to calculate BMI. Physical Exam  Constitutional:  Frail white female seated in recliner talking to herself, hard to get her to focus on anything else  Cardiovascular: Normal rate, regular rhythm and normal heart sounds.   Pulmonary/Chest: Effort normal and breath sounds normal. No respiratory distress.  Abdominal: Soft. Bowel sounds are normal. She exhibits no distension. There is no tenderness.  Musculoskeletal: Normal range of motion.  Neurological:  In her own world--hard to get her attention--distracted by hallucinations  Skin: Skin is warm and dry.    Labs reviewed:  Recent Labs  07/26/16 08/15/16 0248 02/14/17 0600  NA 142 144 145  K 4.7 4.3 3.8  BUN 17 24* 23*  CREATININE 0.9 0.8 0.7    Recent Labs  07/26/16 08/15/16 0248 02/14/17 0600  AST 15 18 20   ALT 10 13 14   ALKPHOS 76 74 83    Recent Labs  05/30/16 07/26/16 08/15/16 0248  WBC 6.2 6.7  8.2  HGB 12.4 14.0 14.4  HCT 41 46 47*  PLT 295 296 221   Lab Results  Component Value Date   TSH 0.57 11/14/2016   Lab Results  Component Value Date   HGBA1C 5.6 03/07/2016   Lab Results  Component Value Date   CHOL 211 (A) 09/02/2015   HDL 47 09/02/2015   LDLCALC 136 09/02/2015   TRIG 140 09/02/2015    Assessment/Plan 1. Dementia with psychosis -end stages with FTT -cont hospice supportive care -avoid labs and tests  2. Depression due to dementia -has not responded to therapies provided, continues to hallucinate, have tearful spells and poor po intake  3. Slow transit constipation -cont with current bowel regimen and suppositories when needed  4. Gait abnormality -dementia too severe to maintain ability to use assistive device consistently so having falls, close supervision and assistance when walking around  5. Skin ulcer of toe of right foot, limited to breakdown of skin (HCC) -treatments as per podiatry, beneath great toenail  6. Adult failure to thrive -due to end stage dementia, cont hospice care  Family/ staff Communication: discussed with memory care nurse  Labs/tests ordered:  No further labs/tests due to goals of care  Sovereign Ramiro L. Elmond Poehlman, D.O. Geriatrics [facility] [address] [address], [place] [address] Cell Phone (Mon-Fri 8am-5pm):  [phone] On Call:  [phone] & follow prompts after 5pm & weekends Office Phone:  [phone] Office Fax:  [phone]

## 2017-05-08 DIAGNOSIS — E46 Unspecified protein-calorie malnutrition: Secondary | ICD-10-CM | POA: Diagnosis not present

## 2017-05-08 DIAGNOSIS — I10 Essential (primary) hypertension: Secondary | ICD-10-CM | POA: Diagnosis not present

## 2017-05-08 DIAGNOSIS — E785 Hyperlipidemia, unspecified: Secondary | ICD-10-CM | POA: Diagnosis not present

## 2017-05-08 DIAGNOSIS — E039 Hypothyroidism, unspecified: Secondary | ICD-10-CM | POA: Diagnosis not present

## 2017-05-08 DIAGNOSIS — G309 Alzheimer's disease, unspecified: Secondary | ICD-10-CM | POA: Diagnosis not present

## 2017-05-09 DIAGNOSIS — E039 Hypothyroidism, unspecified: Secondary | ICD-10-CM | POA: Diagnosis not present

## 2017-05-09 DIAGNOSIS — E46 Unspecified protein-calorie malnutrition: Secondary | ICD-10-CM | POA: Diagnosis not present

## 2017-05-09 DIAGNOSIS — E785 Hyperlipidemia, unspecified: Secondary | ICD-10-CM | POA: Diagnosis not present

## 2017-05-09 DIAGNOSIS — I10 Essential (primary) hypertension: Secondary | ICD-10-CM | POA: Diagnosis not present

## 2017-05-09 DIAGNOSIS — G309 Alzheimer's disease, unspecified: Secondary | ICD-10-CM | POA: Diagnosis not present

## 2017-05-12 ENCOUNTER — Encounter: Payer: Self-pay | Admitting: Internal Medicine

## 2017-05-12 DIAGNOSIS — L97509 Non-pressure chronic ulcer of other part of unspecified foot with unspecified severity: Secondary | ICD-10-CM | POA: Insufficient documentation

## 2017-05-12 DIAGNOSIS — R627 Adult failure to thrive: Secondary | ICD-10-CM | POA: Insufficient documentation

## 2017-05-14 DIAGNOSIS — E039 Hypothyroidism, unspecified: Secondary | ICD-10-CM | POA: Diagnosis not present

## 2017-05-14 DIAGNOSIS — G309 Alzheimer's disease, unspecified: Secondary | ICD-10-CM | POA: Diagnosis not present

## 2017-05-14 DIAGNOSIS — E46 Unspecified protein-calorie malnutrition: Secondary | ICD-10-CM | POA: Diagnosis not present

## 2017-05-14 DIAGNOSIS — E785 Hyperlipidemia, unspecified: Secondary | ICD-10-CM | POA: Diagnosis not present

## 2017-05-14 DIAGNOSIS — I10 Essential (primary) hypertension: Secondary | ICD-10-CM | POA: Diagnosis not present

## 2017-05-23 DIAGNOSIS — E46 Unspecified protein-calorie malnutrition: Secondary | ICD-10-CM | POA: Diagnosis not present

## 2017-05-23 DIAGNOSIS — E785 Hyperlipidemia, unspecified: Secondary | ICD-10-CM | POA: Diagnosis not present

## 2017-05-23 DIAGNOSIS — I10 Essential (primary) hypertension: Secondary | ICD-10-CM | POA: Diagnosis not present

## 2017-05-23 DIAGNOSIS — E039 Hypothyroidism, unspecified: Secondary | ICD-10-CM | POA: Diagnosis not present

## 2017-05-23 DIAGNOSIS — G309 Alzheimer's disease, unspecified: Secondary | ICD-10-CM | POA: Diagnosis not present

## 2017-05-24 DIAGNOSIS — I10 Essential (primary) hypertension: Secondary | ICD-10-CM | POA: Diagnosis not present

## 2017-05-24 DIAGNOSIS — E785 Hyperlipidemia, unspecified: Secondary | ICD-10-CM | POA: Diagnosis not present

## 2017-05-24 DIAGNOSIS — E46 Unspecified protein-calorie malnutrition: Secondary | ICD-10-CM | POA: Diagnosis not present

## 2017-05-24 DIAGNOSIS — G309 Alzheimer's disease, unspecified: Secondary | ICD-10-CM | POA: Diagnosis not present

## 2017-05-24 DIAGNOSIS — E039 Hypothyroidism, unspecified: Secondary | ICD-10-CM | POA: Diagnosis not present

## 2017-05-28 DIAGNOSIS — G309 Alzheimer's disease, unspecified: Secondary | ICD-10-CM | POA: Diagnosis not present

## 2017-05-28 DIAGNOSIS — I10 Essential (primary) hypertension: Secondary | ICD-10-CM | POA: Diagnosis not present

## 2017-05-28 DIAGNOSIS — E039 Hypothyroidism, unspecified: Secondary | ICD-10-CM | POA: Diagnosis not present

## 2017-05-28 DIAGNOSIS — E785 Hyperlipidemia, unspecified: Secondary | ICD-10-CM | POA: Diagnosis not present

## 2017-05-28 DIAGNOSIS — E46 Unspecified protein-calorie malnutrition: Secondary | ICD-10-CM | POA: Diagnosis not present

## 2017-05-29 DIAGNOSIS — E039 Hypothyroidism, unspecified: Secondary | ICD-10-CM | POA: Diagnosis not present

## 2017-05-29 DIAGNOSIS — E46 Unspecified protein-calorie malnutrition: Secondary | ICD-10-CM | POA: Diagnosis not present

## 2017-05-29 DIAGNOSIS — G309 Alzheimer's disease, unspecified: Secondary | ICD-10-CM | POA: Diagnosis not present

## 2017-05-29 DIAGNOSIS — I10 Essential (primary) hypertension: Secondary | ICD-10-CM | POA: Diagnosis not present

## 2017-05-29 DIAGNOSIS — E785 Hyperlipidemia, unspecified: Secondary | ICD-10-CM | POA: Diagnosis not present

## 2017-05-30 DIAGNOSIS — E039 Hypothyroidism, unspecified: Secondary | ICD-10-CM | POA: Diagnosis not present

## 2017-05-30 DIAGNOSIS — I10 Essential (primary) hypertension: Secondary | ICD-10-CM | POA: Diagnosis not present

## 2017-05-30 DIAGNOSIS — E785 Hyperlipidemia, unspecified: Secondary | ICD-10-CM | POA: Diagnosis not present

## 2017-05-30 DIAGNOSIS — G309 Alzheimer's disease, unspecified: Secondary | ICD-10-CM | POA: Diagnosis not present

## 2017-05-30 DIAGNOSIS — E46 Unspecified protein-calorie malnutrition: Secondary | ICD-10-CM | POA: Diagnosis not present

## 2017-05-31 DIAGNOSIS — G309 Alzheimer's disease, unspecified: Secondary | ICD-10-CM | POA: Diagnosis not present

## 2017-05-31 DIAGNOSIS — E46 Unspecified protein-calorie malnutrition: Secondary | ICD-10-CM | POA: Diagnosis not present

## 2017-05-31 DIAGNOSIS — E785 Hyperlipidemia, unspecified: Secondary | ICD-10-CM | POA: Diagnosis not present

## 2017-05-31 DIAGNOSIS — I10 Essential (primary) hypertension: Secondary | ICD-10-CM | POA: Diagnosis not present

## 2017-05-31 DIAGNOSIS — E039 Hypothyroidism, unspecified: Secondary | ICD-10-CM | POA: Diagnosis not present

## 2017-06-05 DIAGNOSIS — E46 Unspecified protein-calorie malnutrition: Secondary | ICD-10-CM | POA: Diagnosis not present

## 2017-06-05 DIAGNOSIS — I10 Essential (primary) hypertension: Secondary | ICD-10-CM | POA: Diagnosis not present

## 2017-06-05 DIAGNOSIS — E785 Hyperlipidemia, unspecified: Secondary | ICD-10-CM | POA: Diagnosis not present

## 2017-06-05 DIAGNOSIS — G309 Alzheimer's disease, unspecified: Secondary | ICD-10-CM | POA: Diagnosis not present

## 2017-06-05 DIAGNOSIS — E039 Hypothyroidism, unspecified: Secondary | ICD-10-CM | POA: Diagnosis not present

## 2017-06-11 DIAGNOSIS — E039 Hypothyroidism, unspecified: Secondary | ICD-10-CM | POA: Diagnosis not present

## 2017-06-11 DIAGNOSIS — E785 Hyperlipidemia, unspecified: Secondary | ICD-10-CM | POA: Diagnosis not present

## 2017-06-11 DIAGNOSIS — E46 Unspecified protein-calorie malnutrition: Secondary | ICD-10-CM | POA: Diagnosis not present

## 2017-06-11 DIAGNOSIS — I10 Essential (primary) hypertension: Secondary | ICD-10-CM | POA: Diagnosis not present

## 2017-06-11 DIAGNOSIS — G309 Alzheimer's disease, unspecified: Secondary | ICD-10-CM | POA: Diagnosis not present

## 2017-06-12 DIAGNOSIS — E785 Hyperlipidemia, unspecified: Secondary | ICD-10-CM | POA: Diagnosis not present

## 2017-06-12 DIAGNOSIS — G309 Alzheimer's disease, unspecified: Secondary | ICD-10-CM | POA: Diagnosis not present

## 2017-06-12 DIAGNOSIS — E46 Unspecified protein-calorie malnutrition: Secondary | ICD-10-CM | POA: Diagnosis not present

## 2017-06-12 DIAGNOSIS — I10 Essential (primary) hypertension: Secondary | ICD-10-CM | POA: Diagnosis not present

## 2017-06-12 DIAGNOSIS — E039 Hypothyroidism, unspecified: Secondary | ICD-10-CM | POA: Diagnosis not present

## 2017-06-13 DIAGNOSIS — E785 Hyperlipidemia, unspecified: Secondary | ICD-10-CM | POA: Diagnosis not present

## 2017-06-13 DIAGNOSIS — E46 Unspecified protein-calorie malnutrition: Secondary | ICD-10-CM | POA: Diagnosis not present

## 2017-06-13 DIAGNOSIS — G309 Alzheimer's disease, unspecified: Secondary | ICD-10-CM | POA: Diagnosis not present

## 2017-06-13 DIAGNOSIS — I10 Essential (primary) hypertension: Secondary | ICD-10-CM | POA: Diagnosis not present

## 2017-06-13 DIAGNOSIS — E039 Hypothyroidism, unspecified: Secondary | ICD-10-CM | POA: Diagnosis not present

## 2017-06-14 DIAGNOSIS — G309 Alzheimer's disease, unspecified: Secondary | ICD-10-CM | POA: Diagnosis not present

## 2017-06-14 DIAGNOSIS — E785 Hyperlipidemia, unspecified: Secondary | ICD-10-CM | POA: Diagnosis not present

## 2017-06-14 DIAGNOSIS — I10 Essential (primary) hypertension: Secondary | ICD-10-CM | POA: Diagnosis not present

## 2017-06-14 DIAGNOSIS — E46 Unspecified protein-calorie malnutrition: Secondary | ICD-10-CM | POA: Diagnosis not present

## 2017-06-14 DIAGNOSIS — E039 Hypothyroidism, unspecified: Secondary | ICD-10-CM | POA: Diagnosis not present

## 2017-06-17 DIAGNOSIS — E785 Hyperlipidemia, unspecified: Secondary | ICD-10-CM | POA: Diagnosis not present

## 2017-06-17 DIAGNOSIS — E46 Unspecified protein-calorie malnutrition: Secondary | ICD-10-CM | POA: Diagnosis not present

## 2017-06-17 DIAGNOSIS — G309 Alzheimer's disease, unspecified: Secondary | ICD-10-CM | POA: Diagnosis not present

## 2017-06-17 DIAGNOSIS — I10 Essential (primary) hypertension: Secondary | ICD-10-CM | POA: Diagnosis not present

## 2017-06-17 DIAGNOSIS — E039 Hypothyroidism, unspecified: Secondary | ICD-10-CM | POA: Diagnosis not present

## 2017-06-18 DIAGNOSIS — I10 Essential (primary) hypertension: Secondary | ICD-10-CM | POA: Diagnosis not present

## 2017-06-18 DIAGNOSIS — G309 Alzheimer's disease, unspecified: Secondary | ICD-10-CM | POA: Diagnosis not present

## 2017-06-18 DIAGNOSIS — E46 Unspecified protein-calorie malnutrition: Secondary | ICD-10-CM | POA: Diagnosis not present

## 2017-06-18 DIAGNOSIS — E785 Hyperlipidemia, unspecified: Secondary | ICD-10-CM | POA: Diagnosis not present

## 2017-06-18 DIAGNOSIS — E039 Hypothyroidism, unspecified: Secondary | ICD-10-CM | POA: Diagnosis not present

## 2017-06-26 DIAGNOSIS — G309 Alzheimer's disease, unspecified: Secondary | ICD-10-CM | POA: Diagnosis not present

## 2017-06-26 DIAGNOSIS — I10 Essential (primary) hypertension: Secondary | ICD-10-CM | POA: Diagnosis not present

## 2017-06-26 DIAGNOSIS — E785 Hyperlipidemia, unspecified: Secondary | ICD-10-CM | POA: Diagnosis not present

## 2017-06-26 DIAGNOSIS — E039 Hypothyroidism, unspecified: Secondary | ICD-10-CM | POA: Diagnosis not present

## 2017-06-26 DIAGNOSIS — E46 Unspecified protein-calorie malnutrition: Secondary | ICD-10-CM | POA: Diagnosis not present

## 2017-07-02 DIAGNOSIS — E46 Unspecified protein-calorie malnutrition: Secondary | ICD-10-CM | POA: Diagnosis not present

## 2017-07-02 DIAGNOSIS — E039 Hypothyroidism, unspecified: Secondary | ICD-10-CM | POA: Diagnosis not present

## 2017-07-02 DIAGNOSIS — G309 Alzheimer's disease, unspecified: Secondary | ICD-10-CM | POA: Diagnosis not present

## 2017-07-02 DIAGNOSIS — E785 Hyperlipidemia, unspecified: Secondary | ICD-10-CM | POA: Diagnosis not present

## 2017-07-02 DIAGNOSIS — I10 Essential (primary) hypertension: Secondary | ICD-10-CM | POA: Diagnosis not present

## 2017-07-03 ENCOUNTER — Encounter: Payer: Self-pay | Admitting: Adult Health

## 2017-07-03 ENCOUNTER — Non-Acute Institutional Stay: Payer: Medicare Other | Admitting: Adult Health

## 2017-07-03 DIAGNOSIS — G8929 Other chronic pain: Secondary | ICD-10-CM

## 2017-07-03 DIAGNOSIS — E038 Other specified hypothyroidism: Secondary | ICD-10-CM

## 2017-07-03 DIAGNOSIS — R52 Pain, unspecified: Secondary | ICD-10-CM | POA: Diagnosis not present

## 2017-07-03 DIAGNOSIS — K5901 Slow transit constipation: Secondary | ICD-10-CM

## 2017-07-03 DIAGNOSIS — F0280 Dementia in other diseases classified elsewhere without behavioral disturbance: Secondary | ICD-10-CM

## 2017-07-03 DIAGNOSIS — F329 Major depressive disorder, single episode, unspecified: Secondary | ICD-10-CM | POA: Diagnosis not present

## 2017-07-03 DIAGNOSIS — F0392 Unspecified dementia, unspecified severity, with psychotic disturbance: Secondary | ICD-10-CM

## 2017-07-03 DIAGNOSIS — F0393 Unspecified dementia, unspecified severity, with mood disturbance: Secondary | ICD-10-CM

## 2017-07-03 DIAGNOSIS — M810 Age-related osteoporosis without current pathological fracture: Secondary | ICD-10-CM | POA: Diagnosis not present

## 2017-07-03 DIAGNOSIS — F0391 Unspecified dementia with behavioral disturbance: Secondary | ICD-10-CM | POA: Diagnosis not present

## 2017-07-03 DIAGNOSIS — R634 Abnormal weight loss: Secondary | ICD-10-CM

## 2017-07-03 DIAGNOSIS — R627 Adult failure to thrive: Secondary | ICD-10-CM

## 2017-07-03 NOTE — Progress Notes (Signed)
Location:  Medical illustrator of Service:  ALF (13) Provider:  Peggye Ley, ANP [facility] [phone]  Kermit Balo, DO  Patient Care Team: Kermit Balo, DO as PCP - General (Geriatric Medicine) [facility], Guy Sandifer, MD as Consulting Physician (Orthopedic Surgery) Archer Asa, MD as Consulting Physician (Psychiatry)  Extended Emergency Contact Information Primary Emergency Contact: [name],[name] Address: [phone]          [place] [address] [place] Home Phone: [phone] Mobile Phone: [phone] Relation: None  Code Status:  DNR Goals of care: Advanced Directive information Advanced Directives 07/03/2017  Does Patient Have a Medical Advance Directive? Yes  Type of Advance Directive Out of facility DNR (pink MOST or yellow form);Healthcare Power of [place];Living will  Copy of Healthcare Power of Attorney in Chart? Yes  Pre-existing out of facility DNR order (yellow form or pink MOST form) Yellow form placed in chart (order not valid for inpatient use);Pink MOST form placed in chart (order not valid for inpatient use)     Chief Complaint  Patient presents with  . Medical Management of Chronic Issues    HPI:  Pt is a 83 y.o. female seen today for medical management of chronic diseases.  She resides in memory care and is followed by hospice due to declining status, weight loss, and advanced dementia. Recently she has become more pleasant and taking her medication on a more regular basis. She continues with some periods of agitation and resistance to care but the staff feel that this is overall improved. She has less crying episodes and less hallucinations. She walks with a walker and apparently fell yesterday but did not sustain an injury. She uses a walker for short distances but due to progressive weakness she is using a WC for longer distances.   Low back pain: long hx  of surgeries and takes scheduled tylenol for pain, no reports of pain from the staff  OP: currently on Prolia but has been too agitated to go have a dexa scan completed Past Medical History:  Diagnosis Date  . Backache, unspecified 04/10/2009  . Cervicalgia 09/12/2005  . Closed fracture of unspecified part of upper end of humerus 09/28/2013   Right proximal humerus fracture 09/24/2013  . Delusion (HCC) 06/04/2013  . Herpes zoster with other nervous system complications(053.19) 09/12/2008  . Memory deficit   . Other and unspecified hyperlipidemia 07/10/2009  . Other malaise and fatigue 10/09/2009  . Rash and other nonspecific skin eruption 11/02/2007  . Senile osteoporosis 07/09/2006  . Shortness of breath 11/02/2007  . Unspecified constipation   . Unspecified essential hypertension 11/02/2007  . Unspecified hearing loss 12/05/2008  . Unspecified hypothyroidism 06/05/2005  . Unspecified vitamin D deficiency 02/03/2012   Past Surgical History:  Procedure Laterality Date  . BACK SURGERY  2006   Dr. Lequita Halt  . CATARACT EXTRACTION, BILATERAL    . KIDNEY STONE SURGERY  1959   Dr. Elige Radon  . LAMINECTOMY  03/20/2005   Centtal with Bil L3 & L4 nerve root decompression  Dr. Otelia Sergeant  . SPINE SURGERY  1991   Dr. Otelia Sergeant  . SPINE SURGERY  1996   Dr. Salli Real  . TOTAL KNEE ARTHROPLASTY Left 09/15/2002  . TOTAL KNEE ARTHROPLASTY  2000   Dr. Jerl Santos    Allergies  Allergen Reactions  . Codeine   . Darvocet [Propoxyphene N-Acetaminophen]   . Erythromycin   . Hydantoins   . Lyrica [  Pregabalin]   . Morphine And Related   . Penicillins   . Percocet [Oxycodone-Acetaminophen]   . Quaternium-15   . Sulfa Antibiotics   . Sulfites   . Tobramycin-Dexamethasone     Outpatient Encounter Prescriptions as of 07/03/2017  Medication Sig  . acetaminophen (TYLENOL) 325 MG tablet Take 650 mg by mouth 2 (two) times daily.  . bisacodyl (DULCOLAX) 10 MG suppository Place 10 mg rectally as needed for moderate  constipation.  . Cholecalciferol (VITAMIN D3) 2000 units capsule Take 2,000 Units by mouth daily.  Marland Kitchen denosumab (PROLIA) 60 MG/ML SOLN injection Inject 60 mg into the skin every 6 (six) months. Administer in upper arm, thigh, or abdomen  . lactose free nutrition (BOOST) LIQD Take 237 mLs by mouth 2 (two) times daily between meals.  Marland Kitchen levothyroxine (SYNTHROID, LEVOTHROID) 75 MCG tablet Take 75 mcg by mouth daily before breakfast.  . LORazepam (ATIVAN) 0.5 MG tablet Take 0.5 mg by mouth 2 (two) times daily. Ativan gel 0.5 mg (if not taking PO) topically q 8 hrs prn anxiety, agitation  . Melatonin 5 MG TABS Take 5 mg by mouth at bedtime.  . polyethylene glycol powder (GLYCOLAX/MIRALAX) powder Take 17 g by mouth daily.  . QUEtiapine (SEROQUEL) 50 MG tablet Take 75 mg by mouth 2 (two) times daily.   . sennosides-docusate sodium (SENOKOT-S) 8.6-50 MG tablet Take 2 tablets by mouth 2 (two) times daily.   . Valproate Sodium (DEPAKENE) 250 MG/5ML SOLN solution Take 250 mg by mouth 2 (two) times daily.   Marland Kitchen venlafaxine (EFFEXOR) 75 MG tablet Take 75 mg by mouth 2 (two) times daily.  . [DISCONTINUED] acetaminophen (TYLENOL) 160 MG/5ML suspension Take 20.3 mg by mouth 2 (two) times daily.   No facility-administered encounter medications on file as of 07/03/2017.     Review of Systems  Immunization History  Administered Date(s) Administered  . Influenza Inj Mdck Quad Pf 08/01/2016  . Influenza Whole 10/24/2011  . Influenza-Unspecified 07/27/2013, 07/19/2014, 07/27/2015  . PPD Test 06/19/2013  . Pneumococcal Conjugate-13 01/17/2015  . Zoster 05/05/2006   Pertinent  Health Maintenance Due  Topic Date Due  . PNA vac Low Risk Adult (2 of 2 - PPSV23) 01/17/2016  . INFLUENZA VACCINE  05/14/2017  . DEXA SCAN  Completed   Fall Risk  08/17/2014  Falls in the past year? No  Risk for fall due to : History of fall(s)  Risk for fall due to: Comment 09-24-2013 broke right shoulder   Functional Status  Survey:    Vitals:   07/03/17 1613  Weight: 132 lb (59.9 kg)   Body mass index is 24.94 kg/m. Physical Exam  Labs reviewed:  Recent Labs  07/26/16 08/15/16 0248 02/14/17 0600  NA 142 144 145  K 4.7 4.3 3.8  BUN 17 24* 23*  CREATININE 0.9 0.8 0.7    Recent Labs  07/26/16 08/15/16 0248 02/14/17 0600  AST ALT ALKPHOS 76 74 83    Recent Labs  07/26/16 08/15/16 0248  WBC 6.7 8.2  HGB 14.0 14.4  HCT 46 47*  PLT 296 221   Lab Results  Component Value Date   TSH 0.57 11/14/2016   Lab Results  Component Value Date   HGBA1C 5.6 03/07/2016   Lab Results  Component Value Date   CHOL 211 (A) 09/02/2015   HDL 47 09/02/2015   LDLCALC 136 09/02/2015   TRIG 140 09/02/2015    Significant Diagnostic Results in last  30 days:  No results found.  Assessment/Plan  1. Dementia with psychosis Progressive cognitive and functional decline No longer benefits from medication for her memory  2. Depression due to dementia Improved mood/behaviors per staff Continue depakote, seroquel, and effexor  3. Other specified hypothyroidism Continue synthroid 75 mcg qd Check TSH annually  4. Slow transit constipation Continues with difficulty having BMs despite taking Miralax and senna s BID Start Linzess 145 mg qd if insurance will cover and discontinue miralax and senna s  5. Adult failure to thrive Progressive weight loss and functional decline due to dementia  6. Chronic generalized pain Tends to be in her back but she is unable to specify due to dementia Continue scheduled tylenol  7. Senile osteoporosis D/c prolia due to declining status and decreased mobility  8. Weight loss See above  Followed by hospice Goals of care are comfort based Continue boost BID    Family/ staff Communication: discussed with staff  Labs/tests ordered:  NA

## 2017-07-09 DIAGNOSIS — I10 Essential (primary) hypertension: Secondary | ICD-10-CM | POA: Diagnosis not present

## 2017-07-09 DIAGNOSIS — E46 Unspecified protein-calorie malnutrition: Secondary | ICD-10-CM | POA: Diagnosis not present

## 2017-07-09 DIAGNOSIS — G309 Alzheimer's disease, unspecified: Secondary | ICD-10-CM | POA: Diagnosis not present

## 2017-07-09 DIAGNOSIS — E785 Hyperlipidemia, unspecified: Secondary | ICD-10-CM | POA: Diagnosis not present

## 2017-07-09 DIAGNOSIS — E039 Hypothyroidism, unspecified: Secondary | ICD-10-CM | POA: Diagnosis not present

## 2017-07-14 DIAGNOSIS — E039 Hypothyroidism, unspecified: Secondary | ICD-10-CM | POA: Diagnosis not present

## 2017-07-14 DIAGNOSIS — E785 Hyperlipidemia, unspecified: Secondary | ICD-10-CM | POA: Diagnosis not present

## 2017-07-14 DIAGNOSIS — E46 Unspecified protein-calorie malnutrition: Secondary | ICD-10-CM | POA: Diagnosis not present

## 2017-07-14 DIAGNOSIS — G309 Alzheimer's disease, unspecified: Secondary | ICD-10-CM | POA: Diagnosis not present

## 2017-07-14 DIAGNOSIS — I10 Essential (primary) hypertension: Secondary | ICD-10-CM | POA: Diagnosis not present

## 2017-07-17 DIAGNOSIS — E039 Hypothyroidism, unspecified: Secondary | ICD-10-CM | POA: Diagnosis not present

## 2017-07-17 DIAGNOSIS — G309 Alzheimer's disease, unspecified: Secondary | ICD-10-CM | POA: Diagnosis not present

## 2017-07-17 DIAGNOSIS — E46 Unspecified protein-calorie malnutrition: Secondary | ICD-10-CM | POA: Diagnosis not present

## 2017-07-17 DIAGNOSIS — I10 Essential (primary) hypertension: Secondary | ICD-10-CM | POA: Diagnosis not present

## 2017-07-17 DIAGNOSIS — E785 Hyperlipidemia, unspecified: Secondary | ICD-10-CM | POA: Diagnosis not present

## 2017-07-19 DIAGNOSIS — I10 Essential (primary) hypertension: Secondary | ICD-10-CM | POA: Diagnosis not present

## 2017-07-19 DIAGNOSIS — G309 Alzheimer's disease, unspecified: Secondary | ICD-10-CM | POA: Diagnosis not present

## 2017-07-19 DIAGNOSIS — E039 Hypothyroidism, unspecified: Secondary | ICD-10-CM | POA: Diagnosis not present

## 2017-07-19 DIAGNOSIS — E46 Unspecified protein-calorie malnutrition: Secondary | ICD-10-CM | POA: Diagnosis not present

## 2017-07-19 DIAGNOSIS — E785 Hyperlipidemia, unspecified: Secondary | ICD-10-CM | POA: Diagnosis not present

## 2017-07-24 DIAGNOSIS — E039 Hypothyroidism, unspecified: Secondary | ICD-10-CM | POA: Diagnosis not present

## 2017-07-24 DIAGNOSIS — G309 Alzheimer's disease, unspecified: Secondary | ICD-10-CM | POA: Diagnosis not present

## 2017-07-24 DIAGNOSIS — E46 Unspecified protein-calorie malnutrition: Secondary | ICD-10-CM | POA: Diagnosis not present

## 2017-07-24 DIAGNOSIS — E785 Hyperlipidemia, unspecified: Secondary | ICD-10-CM | POA: Diagnosis not present

## 2017-07-24 DIAGNOSIS — I10 Essential (primary) hypertension: Secondary | ICD-10-CM | POA: Diagnosis not present

## 2017-07-25 DIAGNOSIS — E46 Unspecified protein-calorie malnutrition: Secondary | ICD-10-CM | POA: Diagnosis not present

## 2017-07-25 DIAGNOSIS — E785 Hyperlipidemia, unspecified: Secondary | ICD-10-CM | POA: Diagnosis not present

## 2017-07-25 DIAGNOSIS — I10 Essential (primary) hypertension: Secondary | ICD-10-CM | POA: Diagnosis not present

## 2017-07-25 DIAGNOSIS — E039 Hypothyroidism, unspecified: Secondary | ICD-10-CM | POA: Diagnosis not present

## 2017-07-25 DIAGNOSIS — G309 Alzheimer's disease, unspecified: Secondary | ICD-10-CM | POA: Diagnosis not present

## 2017-07-29 DIAGNOSIS — E46 Unspecified protein-calorie malnutrition: Secondary | ICD-10-CM | POA: Diagnosis not present

## 2017-07-29 DIAGNOSIS — G309 Alzheimer's disease, unspecified: Secondary | ICD-10-CM | POA: Diagnosis not present

## 2017-07-29 DIAGNOSIS — I10 Essential (primary) hypertension: Secondary | ICD-10-CM | POA: Diagnosis not present

## 2017-07-29 DIAGNOSIS — E785 Hyperlipidemia, unspecified: Secondary | ICD-10-CM | POA: Diagnosis not present

## 2017-07-29 DIAGNOSIS — E039 Hypothyroidism, unspecified: Secondary | ICD-10-CM | POA: Diagnosis not present

## 2017-07-31 DIAGNOSIS — E039 Hypothyroidism, unspecified: Secondary | ICD-10-CM | POA: Diagnosis not present

## 2017-07-31 DIAGNOSIS — E785 Hyperlipidemia, unspecified: Secondary | ICD-10-CM | POA: Diagnosis not present

## 2017-07-31 DIAGNOSIS — G309 Alzheimer's disease, unspecified: Secondary | ICD-10-CM | POA: Diagnosis not present

## 2017-07-31 DIAGNOSIS — E46 Unspecified protein-calorie malnutrition: Secondary | ICD-10-CM | POA: Diagnosis not present

## 2017-07-31 DIAGNOSIS — I10 Essential (primary) hypertension: Secondary | ICD-10-CM | POA: Diagnosis not present

## 2017-08-01 DIAGNOSIS — I10 Essential (primary) hypertension: Secondary | ICD-10-CM | POA: Diagnosis not present

## 2017-08-01 DIAGNOSIS — E46 Unspecified protein-calorie malnutrition: Secondary | ICD-10-CM | POA: Diagnosis not present

## 2017-08-01 DIAGNOSIS — E039 Hypothyroidism, unspecified: Secondary | ICD-10-CM | POA: Diagnosis not present

## 2017-08-01 DIAGNOSIS — G309 Alzheimer's disease, unspecified: Secondary | ICD-10-CM | POA: Diagnosis not present

## 2017-08-01 DIAGNOSIS — E785 Hyperlipidemia, unspecified: Secondary | ICD-10-CM | POA: Diagnosis not present

## 2017-08-05 DIAGNOSIS — G309 Alzheimer's disease, unspecified: Secondary | ICD-10-CM | POA: Diagnosis not present

## 2017-08-05 DIAGNOSIS — E785 Hyperlipidemia, unspecified: Secondary | ICD-10-CM | POA: Diagnosis not present

## 2017-08-05 DIAGNOSIS — E039 Hypothyroidism, unspecified: Secondary | ICD-10-CM | POA: Diagnosis not present

## 2017-08-05 DIAGNOSIS — I10 Essential (primary) hypertension: Secondary | ICD-10-CM | POA: Diagnosis not present

## 2017-08-05 DIAGNOSIS — E46 Unspecified protein-calorie malnutrition: Secondary | ICD-10-CM | POA: Diagnosis not present

## 2017-08-06 DIAGNOSIS — I10 Essential (primary) hypertension: Secondary | ICD-10-CM | POA: Diagnosis not present

## 2017-08-06 DIAGNOSIS — G309 Alzheimer's disease, unspecified: Secondary | ICD-10-CM | POA: Diagnosis not present

## 2017-08-06 DIAGNOSIS — E039 Hypothyroidism, unspecified: Secondary | ICD-10-CM | POA: Diagnosis not present

## 2017-08-06 DIAGNOSIS — E785 Hyperlipidemia, unspecified: Secondary | ICD-10-CM | POA: Diagnosis not present

## 2017-08-06 DIAGNOSIS — E46 Unspecified protein-calorie malnutrition: Secondary | ICD-10-CM | POA: Diagnosis not present

## 2017-08-12 DIAGNOSIS — E785 Hyperlipidemia, unspecified: Secondary | ICD-10-CM | POA: Diagnosis not present

## 2017-08-12 DIAGNOSIS — E039 Hypothyroidism, unspecified: Secondary | ICD-10-CM | POA: Diagnosis not present

## 2017-08-12 DIAGNOSIS — G309 Alzheimer's disease, unspecified: Secondary | ICD-10-CM | POA: Diagnosis not present

## 2017-08-12 DIAGNOSIS — I10 Essential (primary) hypertension: Secondary | ICD-10-CM | POA: Diagnosis not present

## 2017-08-12 DIAGNOSIS — E46 Unspecified protein-calorie malnutrition: Secondary | ICD-10-CM | POA: Diagnosis not present

## 2017-08-14 DIAGNOSIS — E785 Hyperlipidemia, unspecified: Secondary | ICD-10-CM | POA: Diagnosis not present

## 2017-08-14 DIAGNOSIS — I10 Essential (primary) hypertension: Secondary | ICD-10-CM | POA: Diagnosis not present

## 2017-08-14 DIAGNOSIS — E039 Hypothyroidism, unspecified: Secondary | ICD-10-CM | POA: Diagnosis not present

## 2017-08-14 DIAGNOSIS — G309 Alzheimer's disease, unspecified: Secondary | ICD-10-CM | POA: Diagnosis not present

## 2017-08-14 DIAGNOSIS — E46 Unspecified protein-calorie malnutrition: Secondary | ICD-10-CM | POA: Diagnosis not present

## 2017-08-18 DIAGNOSIS — E785 Hyperlipidemia, unspecified: Secondary | ICD-10-CM | POA: Diagnosis not present

## 2017-08-18 DIAGNOSIS — G309 Alzheimer's disease, unspecified: Secondary | ICD-10-CM | POA: Diagnosis not present

## 2017-08-18 DIAGNOSIS — E039 Hypothyroidism, unspecified: Secondary | ICD-10-CM | POA: Diagnosis not present

## 2017-08-18 DIAGNOSIS — I10 Essential (primary) hypertension: Secondary | ICD-10-CM | POA: Diagnosis not present

## 2017-08-18 DIAGNOSIS — E46 Unspecified protein-calorie malnutrition: Secondary | ICD-10-CM | POA: Diagnosis not present

## 2017-08-19 DIAGNOSIS — E039 Hypothyroidism, unspecified: Secondary | ICD-10-CM | POA: Diagnosis not present

## 2017-08-19 DIAGNOSIS — E46 Unspecified protein-calorie malnutrition: Secondary | ICD-10-CM | POA: Diagnosis not present

## 2017-08-19 DIAGNOSIS — G309 Alzheimer's disease, unspecified: Secondary | ICD-10-CM | POA: Diagnosis not present

## 2017-08-19 DIAGNOSIS — E785 Hyperlipidemia, unspecified: Secondary | ICD-10-CM | POA: Diagnosis not present

## 2017-08-19 DIAGNOSIS — I10 Essential (primary) hypertension: Secondary | ICD-10-CM | POA: Diagnosis not present

## 2017-08-21 ENCOUNTER — Encounter: Payer: Self-pay | Admitting: Adult Health

## 2017-08-21 ENCOUNTER — Non-Acute Institutional Stay: Payer: Medicare Other | Admitting: Adult Health

## 2017-08-21 DIAGNOSIS — F02818 Dementia in other diseases classified elsewhere, unspecified severity, with other behavioral disturbance: Secondary | ICD-10-CM

## 2017-08-21 DIAGNOSIS — R269 Unspecified abnormalities of gait and mobility: Secondary | ICD-10-CM

## 2017-08-21 DIAGNOSIS — G309 Alzheimer's disease, unspecified: Secondary | ICD-10-CM | POA: Diagnosis not present

## 2017-08-21 DIAGNOSIS — G8929 Other chronic pain: Secondary | ICD-10-CM | POA: Diagnosis not present

## 2017-08-21 DIAGNOSIS — E46 Unspecified protein-calorie malnutrition: Secondary | ICD-10-CM | POA: Diagnosis not present

## 2017-08-21 DIAGNOSIS — E785 Hyperlipidemia, unspecified: Secondary | ICD-10-CM | POA: Diagnosis not present

## 2017-08-21 DIAGNOSIS — F0281 Dementia in other diseases classified elsewhere with behavioral disturbance: Secondary | ICD-10-CM

## 2017-08-21 DIAGNOSIS — R634 Abnormal weight loss: Secondary | ICD-10-CM | POA: Diagnosis not present

## 2017-08-21 DIAGNOSIS — F5101 Primary insomnia: Secondary | ICD-10-CM | POA: Diagnosis not present

## 2017-08-21 DIAGNOSIS — K5901 Slow transit constipation: Secondary | ICD-10-CM | POA: Diagnosis not present

## 2017-08-21 DIAGNOSIS — I10 Essential (primary) hypertension: Secondary | ICD-10-CM

## 2017-08-21 DIAGNOSIS — G301 Alzheimer's disease with late onset: Secondary | ICD-10-CM | POA: Diagnosis not present

## 2017-08-21 DIAGNOSIS — E039 Hypothyroidism, unspecified: Secondary | ICD-10-CM | POA: Diagnosis not present

## 2017-08-21 DIAGNOSIS — R52 Pain, unspecified: Secondary | ICD-10-CM | POA: Diagnosis not present

## 2017-08-21 NOTE — Progress Notes (Signed)
[facility]   Place of Service:  ALF (13) Provider:   Peggye Leyhristy Schyler Counsell, ANP [facility] [phone]   Kermit Baloeed, Tiffany L, DO  Patient Care Team: Kermit Baloeed, Tiffany L, DO as PCP - General (Geriatric Medicine) [facility] Nitka, Guy SandiferJames E, MD as Consulting Physician (Orthopedic Surgery) Archer AsaPlovsky, Gerald, MD as Consulting Physician (Psychiatry)  Extended Emergency Contact Information Primary Emergency Contact: [name],[name] Address: [phone]          [place] [id number] [place] Home Phone: [phone] Mobile Phone: [phone] Relation: None  Code Status:  DNR Goals of care: Advanced Directive information Advanced Directives 07/03/2017  Does Patient Have a Medical Advance Directive? Yes  Type of Advance Directive Out of facility DNR (pink MOST or yellow form);Healthcare Power of [place];Living will  Copy of Healthcare Power of Attorney in Chart? Yes  Pre-existing out of facility DNR order (yellow form or pink MOST form) Yellow form placed in chart (order not valid for inpatient use);Pink MOST form placed in chart (order not valid for inpatient use)     Chief Complaint  Patient presents with  . Medical Management of Chronic Issues    HPI:  Pt is a 83 y.o. female seen today for medical management of chronic diseases.  She resides in memory care with progressive dementia with psychosis. Followed by hospice for this as well as weight loss and progressive decline. [name] is now non ambulatory and needs the stand up lift at times.   Weight loss Wt Readings from Last 3 Encounters:  08/21/17 133 lb (60.3 kg)  07/03/17 132 lb (59.9 kg)  03/04/17 142 lb (64.4 kg)   Her behaviors have improved and she does not need prn medication. No longer able to perform for MMSE. She continues on Depakote, scheduled Ativan, and Effexor.   Constipation: doing well on senna s, miralax, and supp QOD. (off linzess  due to cost while on hospice)  No reports of low back pain. She has an extensive hx of back surgery with DDD.   No reports of difficulty sleeping, currently on  melatonin  Past Medical History:  Diagnosis Date  . Backache, unspecified 04/10/2009  . Cervicalgia 09/12/2005  . Closed fracture of unspecified part of upper end of humerus 09/28/2013   Right proximal humerus fracture 09/24/2013  . Delusion (HCC) 06/04/2013  . Herpes zoster with other nervous system complications(053.19) 09/12/2008  . Memory deficit   . Other and unspecified hyperlipidemia 07/10/2009  . Other malaise and fatigue 10/09/2009  . Rash and other nonspecific skin eruption 11/02/2007  . Senile osteoporosis 07/09/2006  . Shortness of breath 11/02/2007  . Unspecified constipation   . Unspecified essential hypertension 11/02/2007  . Unspecified hearing loss 12/05/2008  . Unspecified hypothyroidism 06/05/2005  . Unspecified vitamin D deficiency 02/03/2012   Past Surgical History:  Procedure Laterality Date  . BACK SURGERY  2006   Dr. Lequita HaltAluisio  . CATARACT EXTRACTION, BILATERAL    . KIDNEY STONE SURGERY  1959   Dr. Elige RadonBradley  . LAMINECTOMY  03/20/2005   Centtal with Bil L3 & L4 nerve root decompression  Dr. Otelia SergeantNitka  . SPINE SURGERY  1991   Dr. Otelia SergeantNitka  . SPINE SURGERY  1996   Dr. Salli RealNitak  . TOTAL KNEE ARTHROPLASTY Left 09/15/2002  . TOTAL KNEE ARTHROPLASTY  2000   Dr. Jerl Santosalldorf    Allergies  Allergen Reactions  . Codeine   . Darvocet [Propoxyphene N-Acetaminophen]   . Erythromycin   .  Hydantoins   . Lyrica [Pregabalin]   . Morphine And Related   . Penicillins   . Percocet [Oxycodone-Acetaminophen]   . Quaternium-15   . Sulfa Antibiotics   . Sulfites   . Tobramycin-Dexamethasone     Outpatient Encounter Medications as of 08/21/2017  Medication Sig  . acetaminophen (TYLENOL) 325 MG tablet Take 650 mg 2 (two) times daily by mouth.  . bisacodyl (DULCOLAX) 10 MG suppository Place 10 mg every other day rectally.   .  Cholecalciferol (VITAMIN D3) 2000 units capsule Take 2,000 Units by mouth daily.  Marland Kitchen lactose free nutrition (BOOST) LIQD Take 237 mLs by mouth 2 (two) times daily between meals.  Marland Kitchen levothyroxine (SYNTHROID, LEVOTHROID) 75 MCG tablet Take 75 mcg by mouth daily before breakfast.  . LORazepam (ATIVAN) 0.5 MG tablet Take 0.5 mg by mouth 2 (two) times daily. Ativan gel 0.5 mg (if not taking PO) topically q 8 hrs prn anxiety, agitation  . Melatonin 5 MG TABS Take 5 mg by mouth at bedtime.  . polyethylene glycol powder (GLYCOLAX/MIRALAX) powder Take 17 g by mouth daily.  . QUEtiapine (SEROQUEL) 50 MG tablet Take 75 mg by mouth 2 (two) times daily.   . sennosides-docusate sodium (SENOKOT-S) 8.6-50 MG tablet Take 1 tablet 2 (two) times daily by mouth.   . Valproate Sodium (DEPAKENE) 250 MG/5ML SOLN solution Take 250 mg by mouth 2 (two) times daily.   Marland Kitchen venlafaxine (EFFEXOR) 75 MG tablet Take 75 mg by mouth 2 (two) times daily.  . [DISCONTINUED] acetaminophen (TYLENOL) 325 MG tablet Take 650 mg by mouth 2 (two) times daily.  . [DISCONTINUED] denosumab (PROLIA) 60 MG/ML SOLN injection Inject 60 mg into the skin every 6 (six) months. Administer in upper arm, thigh, or abdomen   No facility-administered encounter medications on file as of 08/21/2017.     Review of Systems  Unable to perform ROS: Dementia    Immunization History  Administered Date(s) Administered  . Influenza Inj Mdck Quad Pf 08/01/2016  . Influenza Whole 10/24/2011  . Influenza-Unspecified 07/27/2013, 07/19/2014, 07/27/2015  . PPD Test 06/19/2013  . Pneumococcal Conjugate-13 01/17/2015  . Zoster 05/05/2006   Pertinent  Health Maintenance Due  Topic Date Due  . PNA vac Low Risk Adult (2 of 2 - PPSV23) 01/17/2016  . INFLUENZA VACCINE  Completed  . DEXA SCAN  Completed   Fall Risk  08/17/2014  Falls in the past year? No  Risk for fall due to : History of fall(s)  Risk for fall due to: Comment 09-24-2013 broke right shoulder    Functional Status Survey:    Vitals:   08/21/17 1030  Weight: 133 lb (60.3 kg)   Body mass index is 25.13 kg/m. Physical Exam  Constitutional: No distress.  HENT:  Head: Normocephalic and atraumatic.  Would not let me see the OP area  Eyes: Conjunctivae are normal. Pupils are equal, round, and reactive to light. Right eye exhibits no discharge. Left eye exhibits no discharge.  Neck: No JVD present.  Cardiovascular: Normal rate and regular rhythm.  No murmur heard. Pulmonary/Chest: Effort normal and breath sounds normal. No respiratory distress. She has no wheezes.  Abdominal: Soft. Bowel sounds are normal.  Musculoskeletal: She exhibits no edema or tenderness.  Neurological: She is alert.  Not oriented, not consistently able to f/c  Skin: Skin is warm and dry. She is not diaphoretic.  Resolving ecchymosis to the right periorbital area  Psychiatric: She has a normal mood and affect.  Nursing note and  vitals reviewed.   Labs reviewed: Recent Labs    02/14/17 0600  NA 145  K 3.8  BUN 23*  CREATININE 0.7   Recent Labs    02/14/17 0600  AST 20  ALT 14  ALKPHOS 83   No results for input(s): WBC, NEUTROABS, HGB, HCT, MCV, PLT in the last 8760 hours. Lab Results  Component Value Date   TSH 0.57 11/14/2016   Lab Results  Component Value Date   HGBA1C 5.6 03/07/2016   Lab Results  Component Value Date   CHOL 211 (A) 09/02/2015   HDL 47 09/02/2015   LDLCALC 136 09/02/2015   TRIG 140 09/02/2015    Significant Diagnostic Results in last 30 days:  No results found.  Assessment/Plan  Dementia with behavioral disturbance Improved behaviors over time as her dementia has progressed. She continues under hospice care with comfort care goals expressed by her family. Continue Depakote, Ativan, and Seroquel. Check depakote level for drug therapy  Monitoring ubt avoid other testing.   Essential hypertension Controlled without medicaition  Gait abnormality No  longer ambulatory due to progressive weakness associated with her dementia  Insomnia Continue melatonin 5 mg qhs  Constipation Controlled. Continue current regimen.  Weight loss This is due to her progressive dementia. There are no indications of dysphagia at this time. Continue nutritional supplement BID.   Chronic generalized pain H/o right shoulder pain, OA, lumbar laminectomy and cervicalgia. On scheduled tyelnol Continue scheduled Tylenol as her pain appears to be controlled.    Family/ staff Communication: discussed with staff  Labs/tests ordered:  Depakote level in am

## 2017-08-21 NOTE — Assessment & Plan Note (Signed)
Controlled. Continue current regimen. 

## 2017-08-21 NOTE — Assessment & Plan Note (Signed)
Continue melatonin 5 mg qhs 

## 2017-08-21 NOTE — Assessment & Plan Note (Signed)
Improved behaviors over time as her dementia has progressed. She continues under hospice care with comfort care goals expressed by her family. Continue Depakote, Ativan, and Seroquel. Check depakote level for drug therapy  Monitoring ubt avoid other testing.

## 2017-08-21 NOTE — Assessment & Plan Note (Signed)
Controlled without medicaition

## 2017-08-21 NOTE — Assessment & Plan Note (Addendum)
H/o right shoulder pain, OA, lumbar laminectomy and cervicalgia. On scheduled tyelnol Continue scheduled Tylenol as her pain appears to be controlled.

## 2017-08-21 NOTE — Assessment & Plan Note (Signed)
This is due to her progressive dementia. There are no indications of dysphagia at this time. Continue nutritional supplement BID.

## 2017-08-21 NOTE — Assessment & Plan Note (Signed)
No longer ambulatory due to progressive weakness associated with her dementia

## 2017-08-26 ENCOUNTER — Non-Acute Institutional Stay: Payer: Medicare Other | Admitting: Internal Medicine

## 2017-08-26 ENCOUNTER — Encounter: Payer: Self-pay | Admitting: Internal Medicine

## 2017-08-26 DIAGNOSIS — E038 Other specified hypothyroidism: Secondary | ICD-10-CM | POA: Diagnosis not present

## 2017-08-26 DIAGNOSIS — F329 Major depressive disorder, single episode, unspecified: Secondary | ICD-10-CM

## 2017-08-26 DIAGNOSIS — H9113 Presbycusis, bilateral: Secondary | ICD-10-CM

## 2017-08-26 DIAGNOSIS — E46 Unspecified protein-calorie malnutrition: Secondary | ICD-10-CM | POA: Diagnosis not present

## 2017-08-26 DIAGNOSIS — F0280 Dementia in other diseases classified elsewhere without behavioral disturbance: Secondary | ICD-10-CM

## 2017-08-26 DIAGNOSIS — F0391 Unspecified dementia with behavioral disturbance: Secondary | ICD-10-CM

## 2017-08-26 DIAGNOSIS — E039 Hypothyroidism, unspecified: Secondary | ICD-10-CM | POA: Diagnosis not present

## 2017-08-26 DIAGNOSIS — M810 Age-related osteoporosis without current pathological fracture: Secondary | ICD-10-CM

## 2017-08-26 DIAGNOSIS — R627 Adult failure to thrive: Secondary | ICD-10-CM | POA: Diagnosis not present

## 2017-08-26 DIAGNOSIS — R634 Abnormal weight loss: Secondary | ICD-10-CM

## 2017-08-26 DIAGNOSIS — I10 Essential (primary) hypertension: Secondary | ICD-10-CM

## 2017-08-26 DIAGNOSIS — E785 Hyperlipidemia, unspecified: Secondary | ICD-10-CM | POA: Diagnosis not present

## 2017-08-26 DIAGNOSIS — F0393 Unspecified dementia, unspecified severity, with mood disturbance: Secondary | ICD-10-CM

## 2017-08-26 DIAGNOSIS — F32A Depression, unspecified: Secondary | ICD-10-CM

## 2017-08-26 DIAGNOSIS — K5901 Slow transit constipation: Secondary | ICD-10-CM | POA: Diagnosis not present

## 2017-08-26 DIAGNOSIS — G309 Alzheimer's disease, unspecified: Secondary | ICD-10-CM | POA: Diagnosis not present

## 2017-08-26 DIAGNOSIS — F0392 Unspecified dementia, unspecified severity, with psychotic disturbance: Secondary | ICD-10-CM

## 2017-08-26 NOTE — Progress Notes (Signed)
Patient ID: [name], female   DOB: 1927-10-06, 83 y.o.   MRN: [id number]  Location:  [facility] Room Number: 307 memory care Place of Service:  ALF [phone] Provider:  Kermit Baloeed, Myron Stankovich L, DO  Patient Care Team: Kermit Baloeed, Ryson Bacha L, DO as PCP - General (Geriatric Medicine) [facility] Nitka, James E, MD as Consulting Physician (Orthopedic Surgery) Archer AsaPlovsky, Gerald, MD as Consulting Physician (Psychiatry)  Extended Emergency Contact Information Primary Emergency Contact: [name],[name] Address: [phone]          [place] [id number] [place] Home Phone: [phone] Mobile Phone: [phone] Relation: None  Code Status:  DNR, Hospice Goals of care: Advanced Directive information Advanced Directives 08/26/2017  Does Patient Have a Medical Advance Directive? No  Type of Advance Directive Out of facility DNR (pink MOST or yellow form);Healthcare Power of [place];Living will  Copy of Healthcare Power of Attorney in Chart? Yes  Would patient like information on creating a medical advance directive? No - Patient declined  Pre-existing out of facility DNR order (yellow form or pink MOST form) Yellow form placed in chart (order not valid for inpatient use);Pink MOST form placed in chart (order not valid for inpatient use)   Chief Complaint  Patient presents with  . Medical Management of Chronic Issues    routine visit    HPI:  Pt is a 83 y.o. female seen today for medical management of chronic diseases.  She lives in memory care ALF due to end stage dementia--she requires assistance with all ADLs.  She is on hospice care with [facility].    Dementia with psychosis:  Has active hallucinations--can be seen talking and distracted--on seroquel.  Speech no longer makes sense and has diminished considerably.  She is on depakote, ativan for anxiety twice a day, melatonin for insomnia.    Depression due to dementia:   Continues on effexor.  Often tearful.    Hypothyroidism:  On levothyroxine 75mcg before breakfast.   Lab Results  Component Value Date   TSH 0.57 11/14/2016   HTN:  bp at goal w/o meds at this point.    HOH:  Presbycusis severe, also interferes with function and dementia.  Senile osteoporosis:  On D3, but benefit no longer there--is not ambulatory, was not candidate for bone density or OP meds.    Weight loss:   Wt Readings from Last 3 Encounters:  08/26/17 133 lb (60.3 kg)  08/21/17 133 lb (60.3 kg)  07/03/17 132 lb (59.9 kg)  actually stable last three months, but cognitive status and depression continue to worsen.  Is down 10 lbs overall in past 6 mos though.  On boost shakes bid b/w meals.  Failure to thrive:  On hospice care.  Mood has been difficult to manage.  Constipation:  On dulcolax supp every other day, miralax daily and senna s bid which have been effective.  Past Medical History:  Diagnosis Date  . Backache, unspecified 04/10/2009  . Cervicalgia 09/12/2005  . Closed fracture of unspecified part of upper end of humerus 09/28/2013   Right proximal humerus fracture 09/24/2013  . Delusion (HCC) 06/04/2013  . Herpes zoster with other nervous system complications(053.19) 09/12/2008  . Memory deficit   . Other and unspecified hyperlipidemia 07/10/2009  . Other malaise and fatigue 10/09/2009  . Rash and other nonspecific skin eruption 11/02/2007  . Senile osteoporosis 07/09/2006  . Shortness of breath 11/02/2007  . Unspecified constipation   . Unspecified essential  hypertension 11/02/2007  . Unspecified hearing loss 12/05/2008  . Unspecified hypothyroidism 06/05/2005  . Unspecified vitamin D deficiency 02/03/2012   Past Surgical History:  Procedure Laterality Date  . BACK SURGERY  2006   Dr. Lequita HaltAluisio  . CATARACT EXTRACTION, BILATERAL    . KIDNEY STONE SURGERY  1959   Dr. Elige RadonBradley  . LAMINECTOMY  03/20/2005   Centtal with Bil L3 & L4 nerve root decompression  Dr. Otelia SergeantNitka  .  SPINE SURGERY  1991   Dr. Otelia SergeantNitka  . SPINE SURGERY  1996   Dr. Salli RealNitak  . TOTAL KNEE ARTHROPLASTY Left 09/15/2002  . TOTAL KNEE ARTHROPLASTY  2000   Dr. Jerl Santosalldorf    Allergies  Allergen Reactions  . Codeine   . Darvocet [Propoxyphene N-Acetaminophen]   . Erythromycin   . Hydantoins   . Lyrica [Pregabalin]   . Morphine And Related   . Penicillins   . Percocet [Oxycodone-Acetaminophen]   . Quaternium-15   . Sulfa Antibiotics   . Sulfites   . Tobramycin-Dexamethasone     Outpatient Encounter Medications as of 08/26/2017  Medication Sig  . acetaminophen (TYLENOL) 325 MG tablet Take 650 mg 2 (two) times daily by mouth.  . bisacodyl (DULCOLAX) 10 MG suppository Place 10 mg every other day rectally.   . Cholecalciferol (VITAMIN D3) 2000 units capsule Take 2,000 Units by mouth daily.  Marland Kitchen. lactose free nutrition (BOOST) LIQD Take 237 mLs by mouth 2 (two) times daily between meals.  Marland Kitchen. levothyroxine (SYNTHROID, LEVOTHROID) 75 MCG tablet Take 75 mcg by mouth daily before breakfast.  . LORazepam (ATIVAN) 0.5 MG tablet Take 0.5 mg 2 (two) times daily by mouth.   . Melatonin 5 MG TABS Take 5 mg by mouth at bedtime.  . polyethylene glycol powder (GLYCOLAX/MIRALAX) powder Take 17 g by mouth daily.  . QUEtiapine (SEROQUEL) 50 MG tablet Take 75 mg by mouth 2 (two) times daily.   . sennosides-docusate sodium (SENOKOT-S) 8.6-50 MG tablet Take 1 tablet 2 (two) times daily by mouth.   . Valproate Sodium (DEPAKENE) 250 MG/5ML SOLN solution Take 250 mg by mouth 2 (two) times daily.   Marland Kitchen. venlafaxine (EFFEXOR) 75 MG tablet Take 75 mg by mouth 2 (two) times daily.   No facility-administered encounter medications on file as of 08/26/2017.     Review of Systems  Unable to perform ROS: Dementia (ROS per nursing)  Constitutional: Positive for unexpected weight change. Negative for activity change, appetite change, chills and fever.  HENT: Negative for congestion.   Respiratory: Negative for chest tightness and  shortness of breath.   Cardiovascular: Negative for chest pain and leg swelling.  Gastrointestinal: Positive for constipation. Negative for abdominal pain, blood in stool, diarrhea, nausea and vomiting.  Genitourinary: Negative for dysuria.       Urinary incontinence  Musculoskeletal: Positive for arthralgias and gait problem.  Skin: Positive for pallor.  Neurological: Positive for weakness. Negative for dizziness.  Hematological: Bruises/bleeds easily.  Psychiatric/Behavioral: Positive for behavioral problems, confusion, hallucinations and sleep disturbance.    Immunization History  Administered Date(s) Administered  . Influenza Inj Mdck Quad Pf 08/01/2016  . Influenza Whole 10/24/2011  . Influenza-Unspecified 07/27/2013, 07/19/2014, 07/27/2015, 08/07/2017  . PPD Test 06/19/2013  . Pneumococcal Conjugate-13 01/17/2015  . Zoster 05/05/2006   Pertinent  Health Maintenance Due  Topic Date Due  . PNA vac Low Risk Adult (2 of 2 - PPSV23) 01/17/2016  . INFLUENZA VACCINE  Completed  . DEXA SCAN  Completed   Fall Risk  08/17/2014  Falls in the past year? No  Risk for fall due to : History of fall(s)  Risk for fall due to: Comment 09-24-2013 broke right shoulder   Functional Status Survey:    Vitals:   08/26/17 1454  BP: 115/76  Pulse: 64  Resp: 20  Temp: 97.6 F (36.4 C)  TempSrc: Oral  SpO2: 90%  Weight: 133 lb (60.3 kg)   Body mass index is 25.13 kg/m. Physical Exam  Constitutional: No distress.  HENT:  Head: Normocephalic and atraumatic.  glasses  Cardiovascular: Normal rate, regular rhythm, normal heart sounds and intact distal pulses.  Pulmonary/Chest: Effort normal and breath sounds normal. No respiratory distress.  Abdominal: Soft. Bowel sounds are normal. She exhibits no distension. There is no tenderness.  Musculoskeletal: Normal range of motion.  Uses manual wheelchair, requires another person  Neurological:  Lethargic today  Skin: Skin is warm and dry.  There is pallor.  Psychiatric:  hallucinating    Labs reviewed: Recent Labs    02/14/17 0600  NA 145  K 3.8  BUN 23*  CREATININE 0.7   Recent Labs    02/14/17 0600  AST 20  ALT 14  ALKPHOS 83   No results for input(s): WBC, NEUTROABS, HGB, HCT, MCV, PLT in the last 8760 hours. Lab Results  Component Value Date   TSH 0.57 11/14/2016   Lab Results  Component Value Date   HGBA1C 5.6 03/07/2016   Lab Results  Component Value Date   CHOL 211 (A) 09/02/2015   HDL 47 09/02/2015   LDLCALC 136 09/02/2015   TRIG 140 09/02/2015    Assessment/Plan 1. Dementia with psychosis -end stages with FTT, continue current med regimen and hospice care  2. Depression due to dementia -continues on effexor therapy, ativan for anxiety  3. Other specified hypothyroidism -cont levothyroxine at current dose, would avoid labs  4. Essential hypertension -bp at goal w/o meds, runs low  5. Presbycusis of both ears -HOH affects function and communciation  6. Senile osteoporosis -would d/c vitamin D at this point, not of benefit at this point, no longer ambulatory, uses wheelchair and she's near end of life  7. Weight loss -stable for 3 mos, but down considerably over 6 mos and over the past couple of years, is on boost supplement  8. Adult failure to thrive -see#7, cont hospice care  9. Slow transit constipation -cont current bowel regimen which has been effective  Family/ staff Communication: discussed with memory care nurse  Labs/tests ordered:  Avoid labs due to goals of care being comfort-based, on hospice  Blakeleigh Domek L. Rondy Krupinski, D.O. Geriatrics [facility] [address] [place], [place] [address] Cell Phone (Mon-Fri 8am-5pm):  [phone] On Call:  [phone] & follow prompts after 5pm & weekends Office Phone:  [phone] Office Fax:  [phone]

## 2017-09-01 ENCOUNTER — Other Ambulatory Visit: Payer: Self-pay | Admitting: *Deleted

## 2017-09-03 DIAGNOSIS — E46 Unspecified protein-calorie malnutrition: Secondary | ICD-10-CM | POA: Diagnosis not present

## 2017-09-03 DIAGNOSIS — G309 Alzheimer's disease, unspecified: Secondary | ICD-10-CM | POA: Diagnosis not present

## 2017-09-03 DIAGNOSIS — I10 Essential (primary) hypertension: Secondary | ICD-10-CM | POA: Diagnosis not present

## 2017-09-03 DIAGNOSIS — E039 Hypothyroidism, unspecified: Secondary | ICD-10-CM | POA: Diagnosis not present

## 2017-09-03 DIAGNOSIS — E785 Hyperlipidemia, unspecified: Secondary | ICD-10-CM | POA: Diagnosis not present

## 2017-09-05 DIAGNOSIS — E785 Hyperlipidemia, unspecified: Secondary | ICD-10-CM | POA: Diagnosis not present

## 2017-09-05 DIAGNOSIS — E039 Hypothyroidism, unspecified: Secondary | ICD-10-CM | POA: Diagnosis not present

## 2017-09-05 DIAGNOSIS — E46 Unspecified protein-calorie malnutrition: Secondary | ICD-10-CM | POA: Diagnosis not present

## 2017-09-05 DIAGNOSIS — I10 Essential (primary) hypertension: Secondary | ICD-10-CM | POA: Diagnosis not present

## 2017-09-05 DIAGNOSIS — G309 Alzheimer's disease, unspecified: Secondary | ICD-10-CM | POA: Diagnosis not present

## 2017-09-08 DIAGNOSIS — E785 Hyperlipidemia, unspecified: Secondary | ICD-10-CM | POA: Diagnosis not present

## 2017-09-08 DIAGNOSIS — E46 Unspecified protein-calorie malnutrition: Secondary | ICD-10-CM | POA: Diagnosis not present

## 2017-09-08 DIAGNOSIS — H9113 Presbycusis, bilateral: Secondary | ICD-10-CM | POA: Insufficient documentation

## 2017-09-08 DIAGNOSIS — G309 Alzheimer's disease, unspecified: Secondary | ICD-10-CM | POA: Diagnosis not present

## 2017-09-08 DIAGNOSIS — E039 Hypothyroidism, unspecified: Secondary | ICD-10-CM | POA: Diagnosis not present

## 2017-09-08 DIAGNOSIS — I10 Essential (primary) hypertension: Secondary | ICD-10-CM | POA: Diagnosis not present

## 2017-09-09 ENCOUNTER — Non-Acute Institutional Stay (SKILLED_NURSING_FACILITY): Payer: Medicare Other

## 2017-09-09 DIAGNOSIS — E039 Hypothyroidism, unspecified: Secondary | ICD-10-CM | POA: Diagnosis not present

## 2017-09-09 DIAGNOSIS — Z0000 Encounter for general adult medical examination without abnormal findings: Secondary | ICD-10-CM | POA: Diagnosis not present

## 2017-09-09 DIAGNOSIS — I10 Essential (primary) hypertension: Secondary | ICD-10-CM | POA: Diagnosis not present

## 2017-09-09 DIAGNOSIS — E46 Unspecified protein-calorie malnutrition: Secondary | ICD-10-CM | POA: Diagnosis not present

## 2017-09-09 DIAGNOSIS — E785 Hyperlipidemia, unspecified: Secondary | ICD-10-CM | POA: Diagnosis not present

## 2017-09-09 DIAGNOSIS — G309 Alzheimer's disease, unspecified: Secondary | ICD-10-CM | POA: Diagnosis not present

## 2017-09-09 NOTE — Patient Instructions (Signed)
[name] , Thank you for taking time to come for your Medicare Wellness Visit. I appreciate your ongoing commitment to your health goals. Please review the following plan we discussed and let me know if I can assist you in the future.   Screening recommendations/referrals: Colonoscopy excluded, pt is over age 81 Mammogram excluded, pt is over age 81 Bone Density up to date Recommended yearly ophthalmology/optometry visit for glaucoma screening and checkup Recommended yearly dental visit for hygiene and checkup  Vaccinations: Influenza vaccine up to date. Due 2019 fall season Pneumococcal vaccine 23 due, ordered Tdap vaccine due, ordered Shingles vaccine not in records    Advanced directives: In Chart  Conditions/risks identified: None  Next appointment: Dr. Renato Gailseed makes rounds   Preventive Care 65 Years and Older, Female Preventive care refers to lifestyle choices and visits with your health care provider that can promote health and wellness. What does preventive care include?  A yearly physical exam. This is also called an annual well check.  Dental exams once or twice a year.  Routine eye exams. Ask your health care provider how often you should have your eyes checked.  Personal lifestyle choices, including:  Daily care of your teeth and gums.  Regular physical activity.  Eating a healthy diet.  Avoiding tobacco and drug use.  Limiting alcohol use.  Practicing safe sex.  Taking low-dose aspirin every day.  Taking vitamin and mineral supplements as recommended by your health care provider. What happens during an annual well check? The services and screenings done by your health care provider during your annual well check will depend on your age, overall health, lifestyle risk factors, and family history of disease. Counseling  Your health care provider may ask you questions about your:  Alcohol use.  Tobacco use.  Drug use.  Emotional well-being.  Home  and relationship well-being.  Sexual activity.  Eating habits.  History of falls.  Memory and ability to understand (cognition).  Work and work Astronomerenvironment.  Reproductive health. Screening  You may have the following tests or measurements:  Height, weight, and BMI.  Blood pressure.  Lipid and cholesterol levels. These may be checked every 5 years, or more frequently if you are over 531 years old.  Skin check.  Lung cancer screening. You may have this screening every year starting at age 81 if you have a 30-pack-year history of smoking and currently smoke or have quit within the past 15 years.  Fecal occult blood test (FOBT) of the stool. You may have this test every year starting at age 81.  Flexible sigmoidoscopy or colonoscopy. You may have a sigmoidoscopy every 5 years or a colonoscopy every 10 years starting at age 81.  Hepatitis C blood test.  Hepatitis B blood test.  Sexually transmitted disease (STD) testing.  Diabetes screening. This is done by checking your blood sugar (glucose) after you have not eaten for a while (fasting). You may have this done every 1-3 years.  Bone density scan. This is done to screen for osteoporosis. You may have this done starting at age 81.  Mammogram. This may be done every 1-2 years. Talk to your health care provider about how often you should have regular mammograms. Talk with your health care provider about your test results, treatment options, and if necessary, the need for more tests. Vaccines  Your health care provider may recommend certain vaccines, such as:  Influenza vaccine. This is recommended every year.  Tetanus, diphtheria, and acellular pertussis (Tdap, Td)  vaccine. You may need a Td booster every 10 years.  Zoster vaccine. You may need this after age 74.  Pneumococcal 13-valent conjugate (PCV13) vaccine. One dose is recommended after age 27.  Pneumococcal polysaccharide (PPSV23) vaccine. One dose is recommended  after age 14. Talk to your health care provider about which screenings and vaccines you need and how often you need them. This information is not intended to replace advice given to you by your health care provider. Make sure you discuss any questions you have with your health care provider. Document Released: 10/27/2015 Document Revised: 06/19/2016 Document Reviewed: 08/01/2015 Elsevier Interactive Patient Education  2017 [place] Prevention in the Home Falls can cause injuries. They can happen to people of all ages. There are many things you can do to make your home safe and to help prevent falls. What can I do on the outside of my home?  Regularly fix the edges of walkways and driveways and fix any cracks.  Remove anything that might make you trip as you walk through a door, such as a raised step or threshold.  Trim any bushes or trees on the path to your home.  Use bright outdoor lighting.  Clear any walking paths of anything that might make someone trip, such as rocks or tools.  Regularly check to see if handrails are loose or broken. Make sure that both sides of any steps have handrails.  Any raised decks and porches should have guardrails on the edges.  Have any leaves, snow, or ice cleared regularly.  Use sand or salt on walking paths during winter.  Clean up any spills in your garage right away. This includes oil or grease spills. What can I do in the bathroom?  Use night lights.  Install grab bars by the toilet and in the tub and shower. Do not use towel bars as grab bars.  Use non-skid mats or decals in the tub or shower.  If you need to sit down in the shower, use a plastic, non-slip stool.  Keep the floor dry. Clean up any water that spills on the floor as soon as it happens.  Remove soap buildup in the tub or shower regularly.  Attach bath mats securely with double-sided non-slip rug tape.  Do not have throw rugs and other things on the floor  that can make you trip. What can I do in the bedroom?  Use night lights.  Make sure that you have a light by your bed that is easy to reach.  Do not use any sheets or blankets that are too big for your bed. They should not hang down onto the floor.  Have a firm chair that has side arms. You can use this for support while you get dressed.  Do not have throw rugs and other things on the floor that can make you trip. What can I do in the kitchen?  Clean up any spills right away.  Avoid walking on wet floors.  Keep items that you use a lot in easy-to-reach places.  If you need to reach something above you, use a strong step stool that has a grab bar.  Keep electrical cords out of the way.  Do not use floor polish or wax that makes floors slippery. If you must use wax, use non-skid floor wax.  Do not have throw rugs and other things on the floor that can make you trip. What can I do with my stairs?  Do not leave  any items on the stairs.  Make sure that there are handrails on both sides of the stairs and use them. Fix handrails that are broken or loose. Make sure that handrails are as long as the stairways.  Check any carpeting to make sure that it is firmly attached to the stairs. Fix any carpet that is loose or worn.  Avoid having throw rugs at the top or bottom of the stairs. If you do have throw rugs, attach them to the floor with carpet tape.  Make sure that you have a light switch at the top of the stairs and the bottom of the stairs. If you do not have them, ask someone to add them for you. What else can I do to help prevent falls?  Wear shoes that:  Do not have high heels.  Have rubber bottoms.  Are comfortable and fit you well.  Are closed at the toe. Do not wear sandals.  If you use a stepladder:  Make sure that it is fully opened. Do not climb a closed stepladder.  Make sure that both sides of the stepladder are locked into place.  Ask someone to hold it  for you, if possible.  Clearly mark and make sure that you can see:  Any grab bars or handrails.  First and last steps.  Where the edge of each step is.  Use tools that help you move around (mobility aids) if they are needed. These include:  Canes.  Walkers.  Scooters.  Crutches.  Turn on the lights when you go into a dark area. Replace any light bulbs as soon as they burn out.  Set up your furniture so you have a clear path. Avoid moving your furniture around.  If any of your floors are uneven, fix them.  If there are any pets around you, be aware of where they are.  Review your medicines with your doctor. Some medicines can make you feel dizzy. This can increase your chance of falling. Ask your doctor what other things that you can do to help prevent falls. This information is not intended to replace advice given to you by your health care provider. Make sure you discuss any questions you have with your health care provider. Document Released: 07/27/2009 Document Revised: 03/07/2016 Document Reviewed: 11/04/2014 Elsevier Interactive Patient Education  2017 [employer].

## 2017-09-09 NOTE — Progress Notes (Signed)
Subjective:   [name] is a 83 y.o. female who presents for an Initial Medicare Annual Wellness Visit at [facility] memory care; incapacitated patient unable to answer questions appropriately       Objective:    Today's Vitals   09/09/17 1149  BP: 128/62  Pulse: (!) 58  Temp: 98 F (36.7 C)  TempSrc: Oral  SpO2: 93%  Weight: 133 lb (60.3 kg)  Height: 5\' 1"  (1.549 m)   Body mass index is 25.13 kg/m.   Current Medications (verified) Outpatient Encounter Medications as of 09/09/2017  Medication Sig  . acetaminophen (TYLENOL) 325 MG tablet Take 650 mg 2 (two) times daily by mouth.  . bisacodyl (DULCOLAX) 10 MG suppository Place 10 mg every other day rectally.   . Cholecalciferol (VITAMIN D3) 2000 units capsule Take 2,000 Units by mouth daily.  Marland Kitchen lactose free nutrition (BOOST) LIQD Take 237 mLs by mouth 2 (two) times daily between meals.  Marland Kitchen levothyroxine (SYNTHROID, LEVOTHROID) 75 MCG tablet Take 75 mcg by mouth daily before breakfast.  . LORazepam (ATIVAN) 0.5 MG tablet Take 0.5 mg 2 (two) times daily by mouth.   . Melatonin 5 MG TABS Take 5 mg by mouth at bedtime.  . polyethylene glycol powder (GLYCOLAX/MIRALAX) powder Take 17 g by mouth daily.  . QUEtiapine (SEROQUEL) 50 MG tablet Take 75 mg by mouth 2 (two) times daily.   . sennosides-docusate sodium (SENOKOT-S) 8.6-50 MG tablet Take 1 tablet 2 (two) times daily by mouth.   . Valproate Sodium (DEPAKENE) 250 MG/5ML SOLN solution Take 250 mg by mouth 2 (two) times daily.   Marland Kitchen venlafaxine (EFFEXOR) 75 MG tablet Take 75 mg by mouth 2 (two) times daily.   No facility-administered encounter medications on file as of 09/09/2017.     Allergies (verified) Codeine; Darvocet [propoxyphene n-acetaminophen]; Erythromycin; Hydantoins; Lyrica [pregabalin]; Morphine and related; Penicillins; Percocet [oxycodone-acetaminophen]; Quaternium-15; Sulfa antibiotics; Sulfites; and Tobramycin-dexamethasone    History: Past Medical History:  Diagnosis Date  . Backache, unspecified 04/10/2009  . Cervicalgia 09/12/2005  . Closed fracture of unspecified part of upper end of humerus 09/28/2013   Right proximal humerus fracture 09/24/2013  . Delusion (HCC) 06/04/2013  . Herpes zoster with other nervous system complications(053.19) 09/12/2008  . Memory deficit   . Other and unspecified hyperlipidemia 07/10/2009  . Other malaise and fatigue 10/09/2009  . Rash and other nonspecific skin eruption 11/02/2007  . Senile osteoporosis 07/09/2006  . Shortness of breath 11/02/2007  . Unspecified constipation   . Unspecified essential hypertension 11/02/2007  . Unspecified hearing loss 12/05/2008  . Unspecified hypothyroidism 06/05/2005  . Unspecified vitamin D deficiency 02/03/2012   Past Surgical History:  Procedure Laterality Date  . BACK SURGERY  2006   Dr. Lequita Ward  . CATARACT EXTRACTION, BILATERAL    . KIDNEY STONE SURGERY  1959   Dr. Elige Ward  . LAMINECTOMY  03/20/2005   Centtal with Bil L3 & L4 nerve root decompression  Dr. Otelia Ward  . SPINE SURGERY  1991   Dr. Otelia Ward  . SPINE SURGERY  1996   Dr. Salli Ward  . TOTAL KNEE ARTHROPLASTY Left 09/15/2002  . TOTAL KNEE ARTHROPLASTY  2000   Dr. Jerl Ward   Family History  Problem Relation Age of Onset  . Heart disease Father    Social History   Occupational History  . Occupation: homemaker  Tobacco Use  . Smoking status: Former Smoker    Last attempt to quit: 02/22/1989    Years since quitting:  28.5  . Smokeless tobacco: Never Used  Substance and Sexual Activity  . Alcohol use: No  . Drug use: No  . Sexual activity: No    Tobacco Counseling Counseling given: Not Answered   Activities of Daily Living In your present state of health, do you have any difficulty performing the following activities: 09/09/2017  Hearing? Y  Vision? N  Difficulty concentrating or making decisions? Y  Walking or climbing stairs? Y  Dressing or bathing? Y  Doing  errands, shopping? Y  Preparing Food and eating ? Y  Using the Toilet? Y  In the past six months, have you accidently leaked urine? Y  Do you have problems with loss of bowel control? Y  Managing your Medications? Y  Managing your Finances? Y  Housekeeping or managing your Housekeeping? Y  Some recent data might be hidden    Immunizations and Health Maintenance Immunization History  Administered Date(s) Administered  . Influenza Inj Mdck Quad Pf 08/01/2016  . Influenza Whole 10/24/2011  . Influenza-Unspecified 07/27/2013, 07/19/2014, 07/27/2015, 08/07/2017  . PPD Test 06/19/2013  . Pneumococcal Conjugate-13 01/17/2015  . Zoster 05/05/2006   Health Maintenance Due  Topic Date Due  . TETANUS/TDAP  02/22/1946  . PNA vac Low Risk Adult (2 of 2 - PPSV23) 01/17/2016    Patient Care Team: [name], Christy L, DO as PCP - General (Geriatric Medicine) [facility] [name], Christy SandiferJames E, MD as Consulting Physician (Orthopedic Surgery) [name], Gerald, MD as Consulting Physician (Psychiatry)  Indicate any recent Medical Services you may have received from other than [facility] providers in the past year (date may be approximate).     Assessment:   This is a routine wellness examination for [place].   Hearing/Vision screen No exam data present  Dietary issues and exercise activities discussed: Current Exercise Habits: The patient does not participate in regular exercise at present, Exercise limited by: neurologic condition(s)  Goals    None     Depression Screen PHQ 2/9 Scores 09/09/2017 08/17/2014  PHQ - 2 Score - 0  Exception Documentation Medical reason -    Fall Risk Fall Risk  09/09/2017 08/17/2014  Falls in the past year? No No  Risk for fall due to : - History of fall(s)  Risk for fall due to: Comment - 09-24-2013 broke right shoulder    Cognitive Function: MMSE - Mini Mental State Exam 09/09/2017 07/26/2014 04/25/2014 06/16/2013  Not completed: Unable to  complete - - -  Orientation to time - 1 0 1  Orientation to Place - 2 2 4   Registration - 3 3 3   Attention/ Calculation - 0 0 0  Recall - 0 0 1  Language- name 2 objects - 2 2 2   Language- repeat - 1 1 1   Language- follow 3 step command - 3 3 3   Language- read & follow direction - 1 1 1   Write a sentence - 1 1 1   Copy design - 1 0 1  Total score - 15 13 18         Screening Tests Health Maintenance  Topic Date Due  . TETANUS/TDAP  02/22/1946  . PNA vac Low Risk Adult (2 of 2 - PPSV23) 01/17/2016  . INFLUENZA VACCINE  Completed  . DEXA SCAN  Completed      Plan:  I have personally reviewed and addressed the Medicare Annual Wellness questionnaire and have noted the following in the patient's chart:  A. Medical and social history B. Use of alcohol, tobacco  or illicit drugs  C. Current medications and supplements D. Functional ability and status Ward.  Nutritional status F.  Physical activity G. Advance directives H. List of other physicians I.  Hospitalizations, surgeries, and ER visits in previous 12 months J.  Vitals K. Screenings to include hearing, vision, cognitive, depression Ward. Referrals and appointments - none  In addition, I am unable to review and discuss with incapacitated patient certain preventive protocols, quality metrics, and best practice recommendations. A written personalized care plan for preventive services as well as general preventive health recommendations were provided to patient.  See attached scanned questionnaire for additional information.   Signed,   Annetta MawSara Gonthier, RN Nurse Health Advisor   Quick Notes   Health Maintenance: TDAP and PNA 23 due-ordered     Abnormal Screen: Unable to complete MMSE     Patient Concerns: None     Nurse Concerns: None

## 2017-09-13 DIAGNOSIS — E039 Hypothyroidism, unspecified: Secondary | ICD-10-CM | POA: Diagnosis not present

## 2017-09-13 DIAGNOSIS — E785 Hyperlipidemia, unspecified: Secondary | ICD-10-CM | POA: Diagnosis not present

## 2017-09-13 DIAGNOSIS — E46 Unspecified protein-calorie malnutrition: Secondary | ICD-10-CM | POA: Diagnosis not present

## 2017-09-13 DIAGNOSIS — I10 Essential (primary) hypertension: Secondary | ICD-10-CM | POA: Diagnosis not present

## 2017-09-13 DIAGNOSIS — G309 Alzheimer's disease, unspecified: Secondary | ICD-10-CM | POA: Diagnosis not present

## 2017-09-17 DIAGNOSIS — E039 Hypothyroidism, unspecified: Secondary | ICD-10-CM | POA: Diagnosis not present

## 2017-09-17 DIAGNOSIS — E46 Unspecified protein-calorie malnutrition: Secondary | ICD-10-CM | POA: Diagnosis not present

## 2017-09-17 DIAGNOSIS — E785 Hyperlipidemia, unspecified: Secondary | ICD-10-CM | POA: Diagnosis not present

## 2017-09-17 DIAGNOSIS — G309 Alzheimer's disease, unspecified: Secondary | ICD-10-CM | POA: Diagnosis not present

## 2017-09-17 DIAGNOSIS — I10 Essential (primary) hypertension: Secondary | ICD-10-CM | POA: Diagnosis not present

## 2017-09-18 DIAGNOSIS — E785 Hyperlipidemia, unspecified: Secondary | ICD-10-CM | POA: Diagnosis not present

## 2017-09-18 DIAGNOSIS — E46 Unspecified protein-calorie malnutrition: Secondary | ICD-10-CM | POA: Diagnosis not present

## 2017-09-18 DIAGNOSIS — G309 Alzheimer's disease, unspecified: Secondary | ICD-10-CM | POA: Diagnosis not present

## 2017-09-18 DIAGNOSIS — E039 Hypothyroidism, unspecified: Secondary | ICD-10-CM | POA: Diagnosis not present

## 2017-09-18 DIAGNOSIS — I10 Essential (primary) hypertension: Secondary | ICD-10-CM | POA: Diagnosis not present

## 2017-09-23 DIAGNOSIS — E785 Hyperlipidemia, unspecified: Secondary | ICD-10-CM | POA: Diagnosis not present

## 2017-09-23 DIAGNOSIS — G309 Alzheimer's disease, unspecified: Secondary | ICD-10-CM | POA: Diagnosis not present

## 2017-09-23 DIAGNOSIS — I10 Essential (primary) hypertension: Secondary | ICD-10-CM | POA: Diagnosis not present

## 2017-09-23 DIAGNOSIS — E46 Unspecified protein-calorie malnutrition: Secondary | ICD-10-CM | POA: Diagnosis not present

## 2017-09-23 DIAGNOSIS — E039 Hypothyroidism, unspecified: Secondary | ICD-10-CM | POA: Diagnosis not present

## 2017-09-24 DIAGNOSIS — E46 Unspecified protein-calorie malnutrition: Secondary | ICD-10-CM | POA: Diagnosis not present

## 2017-09-24 DIAGNOSIS — G309 Alzheimer's disease, unspecified: Secondary | ICD-10-CM | POA: Diagnosis not present

## 2017-09-24 DIAGNOSIS — E039 Hypothyroidism, unspecified: Secondary | ICD-10-CM | POA: Diagnosis not present

## 2017-09-24 DIAGNOSIS — E785 Hyperlipidemia, unspecified: Secondary | ICD-10-CM | POA: Diagnosis not present

## 2017-09-24 DIAGNOSIS — I10 Essential (primary) hypertension: Secondary | ICD-10-CM | POA: Diagnosis not present

## 2017-09-30 DIAGNOSIS — E785 Hyperlipidemia, unspecified: Secondary | ICD-10-CM | POA: Diagnosis not present

## 2017-09-30 DIAGNOSIS — I10 Essential (primary) hypertension: Secondary | ICD-10-CM | POA: Diagnosis not present

## 2017-09-30 DIAGNOSIS — E46 Unspecified protein-calorie malnutrition: Secondary | ICD-10-CM | POA: Diagnosis not present

## 2017-09-30 DIAGNOSIS — G309 Alzheimer's disease, unspecified: Secondary | ICD-10-CM | POA: Diagnosis not present

## 2017-09-30 DIAGNOSIS — E039 Hypothyroidism, unspecified: Secondary | ICD-10-CM | POA: Diagnosis not present

## 2017-10-03 DIAGNOSIS — E039 Hypothyroidism, unspecified: Secondary | ICD-10-CM | POA: Diagnosis not present

## 2017-10-03 DIAGNOSIS — E46 Unspecified protein-calorie malnutrition: Secondary | ICD-10-CM | POA: Diagnosis not present

## 2017-10-03 DIAGNOSIS — G309 Alzheimer's disease, unspecified: Secondary | ICD-10-CM | POA: Diagnosis not present

## 2017-10-03 DIAGNOSIS — I10 Essential (primary) hypertension: Secondary | ICD-10-CM | POA: Diagnosis not present

## 2017-10-03 DIAGNOSIS — E785 Hyperlipidemia, unspecified: Secondary | ICD-10-CM | POA: Diagnosis not present

## 2017-10-09 DIAGNOSIS — E039 Hypothyroidism, unspecified: Secondary | ICD-10-CM | POA: Diagnosis not present

## 2017-10-09 DIAGNOSIS — I10 Essential (primary) hypertension: Secondary | ICD-10-CM | POA: Diagnosis not present

## 2017-10-09 DIAGNOSIS — E785 Hyperlipidemia, unspecified: Secondary | ICD-10-CM | POA: Diagnosis not present

## 2017-10-09 DIAGNOSIS — E46 Unspecified protein-calorie malnutrition: Secondary | ICD-10-CM | POA: Diagnosis not present

## 2017-10-09 DIAGNOSIS — G309 Alzheimer's disease, unspecified: Secondary | ICD-10-CM | POA: Diagnosis not present

## 2017-10-14 DIAGNOSIS — E785 Hyperlipidemia, unspecified: Secondary | ICD-10-CM | POA: Diagnosis not present

## 2017-10-14 DIAGNOSIS — G309 Alzheimer's disease, unspecified: Secondary | ICD-10-CM | POA: Diagnosis not present

## 2017-10-14 DIAGNOSIS — E039 Hypothyroidism, unspecified: Secondary | ICD-10-CM | POA: Diagnosis not present

## 2017-10-14 DIAGNOSIS — I10 Essential (primary) hypertension: Secondary | ICD-10-CM | POA: Diagnosis not present

## 2017-10-14 DIAGNOSIS — E46 Unspecified protein-calorie malnutrition: Secondary | ICD-10-CM | POA: Diagnosis not present

## 2017-10-15 DIAGNOSIS — E039 Hypothyroidism, unspecified: Secondary | ICD-10-CM | POA: Diagnosis not present

## 2017-10-15 DIAGNOSIS — G309 Alzheimer's disease, unspecified: Secondary | ICD-10-CM | POA: Diagnosis not present

## 2017-10-15 DIAGNOSIS — E46 Unspecified protein-calorie malnutrition: Secondary | ICD-10-CM | POA: Diagnosis not present

## 2017-10-15 DIAGNOSIS — E785 Hyperlipidemia, unspecified: Secondary | ICD-10-CM | POA: Diagnosis not present

## 2017-10-15 DIAGNOSIS — I10 Essential (primary) hypertension: Secondary | ICD-10-CM | POA: Diagnosis not present

## 2017-10-20 ENCOUNTER — Non-Acute Institutional Stay: Payer: Medicare Other | Admitting: Adult Health

## 2017-10-20 DIAGNOSIS — G8929 Other chronic pain: Secondary | ICD-10-CM | POA: Diagnosis not present

## 2017-10-20 DIAGNOSIS — R627 Adult failure to thrive: Secondary | ICD-10-CM | POA: Diagnosis not present

## 2017-10-20 DIAGNOSIS — K5901 Slow transit constipation: Secondary | ICD-10-CM

## 2017-10-20 DIAGNOSIS — R269 Unspecified abnormalities of gait and mobility: Secondary | ICD-10-CM | POA: Diagnosis not present

## 2017-10-20 DIAGNOSIS — F0281 Dementia in other diseases classified elsewhere with behavioral disturbance: Secondary | ICD-10-CM | POA: Diagnosis not present

## 2017-10-20 DIAGNOSIS — F329 Major depressive disorder, single episode, unspecified: Secondary | ICD-10-CM | POA: Diagnosis not present

## 2017-10-20 DIAGNOSIS — F0280 Dementia in other diseases classified elsewhere without behavioral disturbance: Secondary | ICD-10-CM | POA: Diagnosis not present

## 2017-10-20 DIAGNOSIS — R634 Abnormal weight loss: Secondary | ICD-10-CM

## 2017-10-20 DIAGNOSIS — F0393 Unspecified dementia, unspecified severity, with mood disturbance: Secondary | ICD-10-CM

## 2017-10-20 DIAGNOSIS — E038 Other specified hypothyroidism: Secondary | ICD-10-CM

## 2017-10-20 DIAGNOSIS — F02818 Dementia in other diseases classified elsewhere, unspecified severity, with other behavioral disturbance: Secondary | ICD-10-CM

## 2017-10-20 DIAGNOSIS — R52 Pain, unspecified: Secondary | ICD-10-CM

## 2017-10-20 DIAGNOSIS — G301 Alzheimer's disease with late onset: Secondary | ICD-10-CM

## 2017-10-22 ENCOUNTER — Encounter: Payer: Self-pay | Admitting: Adult Health

## 2017-10-22 NOTE — Assessment & Plan Note (Signed)
Hoyer lift for all transfers due to instability and inability to follow commands.

## 2017-10-22 NOTE — Progress Notes (Signed)
Location:  Medical illustrator of Service:  ALF (13) Provider:   Peggye Ley, ANP [facility] [phone]   Kermit Balo, DO  Patient Care Team: Kermit Balo, DO as PCP - General (Geriatric Medicine) [facility], Guy Sandifer, MD as Consulting Physician (Orthopedic Surgery) Archer Asa, MD as Consulting Physician (Psychiatry)  Extended Emergency Contact Information Primary Emergency Contact: [name],[name] Address: [phone]          [place] [address] [place] Home Phone: [phone] Mobile Phone: [phone] Relation: None  Code Status: DNR Goals of care: Advanced Directive information Advanced Directives 09/09/2017  Does Patient Have a Medical Advance Directive? Yes  Type of Estate agent of [place];Out of facility DNR (pink MOST or yellow form)  Does patient want to make changes to medical advance directive? No - Patient declined  Copy of Healthcare Power of Attorney in Chart? Yes  Would patient like information on creating a medical advance directive? No - Patient declined  Pre-existing out of facility DNR order (yellow form or pink MOST form) Yellow form placed in chart (order not valid for inpatient use);Pink MOST form placed in chart (order not valid for inpatient use)     Chief Complaint  Patient presents with  . Medical Management of Chronic Issues    HPI:  Pt is a 84 y.o. female seen today for medical management of chronic diseases.  She resides in memory care due to advanced dementia. She is followed by hospice for this reason as well as FTT, chronic back pain, and weight loss.   Dementia: she has a hx of hallucinations and tearful episodes but as her dementia has progressed over the passed 6 months this has improved some. Nsg notes indicate no issues with behavior. She is on ativan, seroquel, effexor, and depakote. Last Dep level was 20 on  08/22/17 which is ordered for her behaviors not seizures. Staff report she is swallowing her meds now and not refusing them.  Weight loss: has lost 4 lbs in the past two months.  Poor intake. Boost ordered twice a day  Gait disorder: she is no longer ambulatory due to her dementia, as well as some chronic back pain related to DDD. She is able to sit up and move her legs to the side of the bed per the staff but she is unsteady. She is not able to follow commands for the stand up lift which is why a hoyer lift is used for transfers.  Hypothyroidism: Lab Results  Component Value Date   TSH 0.57 11/14/2016    Constipation: previously had a rectal fissure which has resolved. Takes miralax and senna s bid  Back pain: extensive surgical hx, DDD noted on previous xrays done at the facility No reports of pain  Noted and she takes tylenol BID   Past Medical History:  Diagnosis Date  . Backache, unspecified 04/10/2009  . Cervicalgia 09/12/2005  . Closed fracture of unspecified part of upper end of humerus 09/28/2013   Right proximal humerus fracture 09/24/2013  . Delusion (HCC) 06/04/2013  . Herpes zoster with other nervous system complications(053.19) 09/12/2008  . Memory deficit   . Other and unspecified hyperlipidemia 07/10/2009  . Other malaise and fatigue 10/09/2009  . Rash and other nonspecific skin eruption 11/02/2007  . Senile osteoporosis 07/09/2006  . Shortness of breath 11/02/2007  . Unspecified constipation   . Unspecified essential hypertension 11/02/2007  . Unspecified hearing  loss 12/05/2008  . Unspecified hypothyroidism 06/05/2005  . Unspecified vitamin D deficiency 02/03/2012   Past Surgical History:  Procedure Laterality Date  . BACK SURGERY  2006   Dr. Lequita HaltAluisio  . CATARACT EXTRACTION, BILATERAL    . KIDNEY STONE SURGERY  1959   Dr. Elige RadonBradley  . LAMINECTOMY  03/20/2005   Centtal with Bil L3 & L4 nerve root decompression  Dr. Otelia SergeantNitka  . SPINE SURGERY  1991   Dr. Otelia SergeantNitka  . SPINE  SURGERY  1996   Dr. Salli RealNitak  . TOTAL KNEE ARTHROPLASTY Left 09/15/2002  . TOTAL KNEE ARTHROPLASTY  2000   Dr. Jerl Santosalldorf    Allergies  Allergen Reactions  . Codeine   . Darvocet [Propoxyphene N-Acetaminophen]   . Erythromycin   . Hydantoins   . Lyrica [Pregabalin]   . Morphine And Related   . Penicillins   . Percocet [Oxycodone-Acetaminophen]   . Quaternium-15   . Sulfa Antibiotics   . Sulfites   . Tobramycin-Dexamethasone     Outpatient Encounter Medications as of 10/20/2017  Medication Sig  . acetaminophen (TYLENOL) 325 MG tablet Take 650 mg 2 (two) times daily by mouth.  . bisacodyl (DULCOLAX) 10 MG suppository Place 10 mg every other day rectally.   . lactose free nutrition (BOOST) LIQD Take 237 mLs by mouth 2 (two) times daily between meals.  Marland Kitchen. levothyroxine (SYNTHROID, LEVOTHROID) 75 MCG tablet Take 75 mcg by mouth daily before breakfast.  . LORazepam (ATIVAN) 0.5 MG tablet Take 0.5 mg 2 (two) times daily by mouth.   . Melatonin 5 MG TABS Take 5 mg by mouth at bedtime.  . polyethylene glycol powder (GLYCOLAX/MIRALAX) powder Take 17 g by mouth daily.  . QUEtiapine (SEROQUEL) 50 MG tablet Take 75 mg by mouth 2 (two) times daily.   . sennosides-docusate sodium (SENOKOT-S) 8.6-50 MG tablet Take 1 tablet 2 (two) times daily by mouth.   . Valproate Sodium (DEPAKENE) 250 MG/5ML SOLN solution Take 250 mg by mouth 2 (two) times daily.   Marland Kitchen. venlafaxine (EFFEXOR) 75 MG tablet Take 75 mg by mouth 2 (two) times daily.  . [DISCONTINUED] Cholecalciferol (VITAMIN D3) 2000 units capsule Take 2,000 Units by mouth daily.   No facility-administered encounter medications on file as of 10/20/2017.     Review of Systems  Unable to perform ROS: Dementia    Immunization History  Administered Date(s) Administered  . Influenza Inj Mdck Quad Pf 08/01/2016  . Influenza Whole 10/24/2011  . Influenza-Unspecified 07/27/2013, 07/19/2014, 07/27/2015, 08/07/2017  . PPD Test 06/19/2013  . Pneumococcal  Conjugate-13 01/17/2015  . Zoster 05/05/2006   Pertinent  Health Maintenance Due  Topic Date Due  . INFLUENZA VACCINE  Completed  . DEXA SCAN  Completed  . PNA vac Low Risk Adult  Completed   Fall Risk  09/09/2017 08/17/2014  Falls in the past year? No No  Risk for fall due to : - History of fall(s)  Risk for fall due to: Comment - 09-24-2013 broke right shoulder   Functional Status Survey:    Vitals:   10/22/17 1547  Weight: 129 lb 9.6 oz (58.8 kg)   Body mass index is 24.49 kg/m.  Wt Readings from Last 3 Encounters:  10/22/17 129 lb 9.6 oz (58.8 kg)  09/09/17 133 lb (60.3 kg)  08/26/17 133 lb (60.3 kg)    Physical Exam  Constitutional: No distress.  HENT:  Head: Normocephalic and atraumatic.  Nose: Nose normal.  Mouth/Throat: No oropharyngeal exudate.  Neck: No JVD present.  Cardiovascular: Normal rate and regular rhythm.  No murmur heard. Pulmonary/Chest: Effort normal and breath sounds normal. No respiratory distress. She has no wheezes.  Abdominal: Soft. Bowel sounds are normal.  Musculoskeletal: She exhibits no edema or tenderness.  kyphosis  Lymphadenopathy:    She has no cervical adenopathy.  Neurological: She is alert.  Mumbles but does not answer q's. Not able to follow commands. No obvious focal deficit.   Skin: Skin is warm and dry. She is not diaphoretic.  Psychiatric: She has a normal mood and affect.  Vitals reviewed.   Labs reviewed: Recent Labs    02/14/17 0600  NA 145  K 3.8  BUN 23*  CREATININE 0.7   Recent Labs    02/14/17 0600  AST 20  ALT 14  ALKPHOS 83   No results for input(s): WBC, NEUTROABS, HGB, HCT, MCV, PLT in the last 8760 hours. Lab Results  Component Value Date   TSH 0.57 11/14/2016   Lab Results  Component Value Date   HGBA1C 5.6 03/07/2016   Lab Results  Component Value Date   CHOL 211 (A) 09/02/2015   HDL 47 09/02/2015   LDLCALC 136 09/02/2015   TRIG 140 09/02/2015    Significant Diagnostic Results  in last 30 days:  No results found.  Assessment/Plan  Hypothyroidism Labs due next month. Continue current dose of synthroid and adjust accordingly when labs return.  Dementia with behavioral disturbance Improved behaviors per staff. Would not taper medication at this point but would consider in the future if she became less coherent.  Depression due to dementia Improved mood per staff with less tearful ness. Continue current dose of effexor.   Constipation Controlled. Continue senna s bid and miralax qd.   Chronic generalized pain Continue scheduled tylenol 650 mg BID.  Adult failure to thrive Progressive decline in cognition, weight, and function. Followed by hospice.  Weight loss Related to intake/dementia Continue boost supplementation.  Gait abnormality Hoyer lift for all transfers due to instability and inability to follow commands.    Family/ staff Communication: discussed with CNA and nurse  Labs/tests ordered:  No routine screening labs due to goals of care. Monitoring labs for depakote therapy ordered when necessary. TSH due with next visit.

## 2017-10-22 NOTE — Assessment & Plan Note (Signed)
Progressive decline in cognition, weight, and function. Followed by hospice.

## 2017-10-22 NOTE — Assessment & Plan Note (Signed)
Improved behaviors per staff. Would not taper medication at this point but would consider in the future if she became less coherent.

## 2017-10-22 NOTE — Assessment & Plan Note (Signed)
Labs due next month. Continue current dose of synthroid and adjust accordingly when labs return.

## 2017-10-22 NOTE — Assessment & Plan Note (Signed)
Related to intake/dementia Continue boost supplementation.

## 2017-10-22 NOTE — Assessment & Plan Note (Signed)
Controlled. Continue senna s bid and miralax qd.

## 2017-10-22 NOTE — Assessment & Plan Note (Signed)
Improved mood per staff with less tearful ness. Continue current dose of effexor.

## 2017-10-22 NOTE — Assessment & Plan Note (Signed)
Continue scheduled tylenol 650 mg BID.

## 2017-10-23 DIAGNOSIS — I10 Essential (primary) hypertension: Secondary | ICD-10-CM | POA: Diagnosis not present

## 2017-10-23 DIAGNOSIS — G309 Alzheimer's disease, unspecified: Secondary | ICD-10-CM | POA: Diagnosis not present

## 2017-10-23 DIAGNOSIS — E039 Hypothyroidism, unspecified: Secondary | ICD-10-CM | POA: Diagnosis not present

## 2017-10-23 DIAGNOSIS — E46 Unspecified protein-calorie malnutrition: Secondary | ICD-10-CM | POA: Diagnosis not present

## 2017-10-23 DIAGNOSIS — E785 Hyperlipidemia, unspecified: Secondary | ICD-10-CM | POA: Diagnosis not present

## 2017-10-31 DIAGNOSIS — I10 Essential (primary) hypertension: Secondary | ICD-10-CM | POA: Diagnosis not present

## 2017-10-31 DIAGNOSIS — G309 Alzheimer's disease, unspecified: Secondary | ICD-10-CM | POA: Diagnosis not present

## 2017-10-31 DIAGNOSIS — E46 Unspecified protein-calorie malnutrition: Secondary | ICD-10-CM | POA: Diagnosis not present

## 2017-10-31 DIAGNOSIS — E785 Hyperlipidemia, unspecified: Secondary | ICD-10-CM | POA: Diagnosis not present

## 2017-10-31 DIAGNOSIS — E039 Hypothyroidism, unspecified: Secondary | ICD-10-CM | POA: Diagnosis not present

## 2017-11-05 DIAGNOSIS — E46 Unspecified protein-calorie malnutrition: Secondary | ICD-10-CM | POA: Diagnosis not present

## 2017-11-05 DIAGNOSIS — E039 Hypothyroidism, unspecified: Secondary | ICD-10-CM | POA: Diagnosis not present

## 2017-11-05 DIAGNOSIS — I10 Essential (primary) hypertension: Secondary | ICD-10-CM | POA: Diagnosis not present

## 2017-11-05 DIAGNOSIS — G309 Alzheimer's disease, unspecified: Secondary | ICD-10-CM | POA: Diagnosis not present

## 2017-11-05 DIAGNOSIS — E785 Hyperlipidemia, unspecified: Secondary | ICD-10-CM | POA: Diagnosis not present

## 2017-11-06 DIAGNOSIS — E46 Unspecified protein-calorie malnutrition: Secondary | ICD-10-CM | POA: Diagnosis not present

## 2017-11-06 DIAGNOSIS — E039 Hypothyroidism, unspecified: Secondary | ICD-10-CM | POA: Diagnosis not present

## 2017-11-06 DIAGNOSIS — E785 Hyperlipidemia, unspecified: Secondary | ICD-10-CM | POA: Diagnosis not present

## 2017-11-06 DIAGNOSIS — G309 Alzheimer's disease, unspecified: Secondary | ICD-10-CM | POA: Diagnosis not present

## 2017-11-06 DIAGNOSIS — I10 Essential (primary) hypertension: Secondary | ICD-10-CM | POA: Diagnosis not present

## 2017-11-13 DIAGNOSIS — E039 Hypothyroidism, unspecified: Secondary | ICD-10-CM | POA: Diagnosis not present

## 2017-11-13 DIAGNOSIS — E46 Unspecified protein-calorie malnutrition: Secondary | ICD-10-CM | POA: Diagnosis not present

## 2017-11-13 DIAGNOSIS — I10 Essential (primary) hypertension: Secondary | ICD-10-CM | POA: Diagnosis not present

## 2017-11-13 DIAGNOSIS — E785 Hyperlipidemia, unspecified: Secondary | ICD-10-CM | POA: Diagnosis not present

## 2017-11-13 DIAGNOSIS — G309 Alzheimer's disease, unspecified: Secondary | ICD-10-CM | POA: Diagnosis not present

## 2017-11-14 DIAGNOSIS — E039 Hypothyroidism, unspecified: Secondary | ICD-10-CM | POA: Diagnosis not present

## 2017-11-14 DIAGNOSIS — G309 Alzheimer's disease, unspecified: Secondary | ICD-10-CM | POA: Diagnosis not present

## 2017-11-14 DIAGNOSIS — E785 Hyperlipidemia, unspecified: Secondary | ICD-10-CM | POA: Diagnosis not present

## 2017-11-14 DIAGNOSIS — E46 Unspecified protein-calorie malnutrition: Secondary | ICD-10-CM | POA: Diagnosis not present

## 2017-11-14 DIAGNOSIS — I10 Essential (primary) hypertension: Secondary | ICD-10-CM | POA: Diagnosis not present

## 2017-11-16 DIAGNOSIS — I10 Essential (primary) hypertension: Secondary | ICD-10-CM | POA: Diagnosis not present

## 2017-11-16 DIAGNOSIS — E039 Hypothyroidism, unspecified: Secondary | ICD-10-CM | POA: Diagnosis not present

## 2017-11-16 DIAGNOSIS — G309 Alzheimer's disease, unspecified: Secondary | ICD-10-CM | POA: Diagnosis not present

## 2017-11-16 DIAGNOSIS — E46 Unspecified protein-calorie malnutrition: Secondary | ICD-10-CM | POA: Diagnosis not present

## 2017-11-16 DIAGNOSIS — E785 Hyperlipidemia, unspecified: Secondary | ICD-10-CM | POA: Diagnosis not present

## 2017-11-17 LAB — TSH: TSH: 0.44 (ref 0.41–5.90)

## 2017-11-21 DIAGNOSIS — E46 Unspecified protein-calorie malnutrition: Secondary | ICD-10-CM | POA: Diagnosis not present

## 2017-11-21 DIAGNOSIS — G309 Alzheimer's disease, unspecified: Secondary | ICD-10-CM | POA: Diagnosis not present

## 2017-11-21 DIAGNOSIS — E785 Hyperlipidemia, unspecified: Secondary | ICD-10-CM | POA: Diagnosis not present

## 2017-11-21 DIAGNOSIS — E039 Hypothyroidism, unspecified: Secondary | ICD-10-CM | POA: Diagnosis not present

## 2017-11-21 DIAGNOSIS — I10 Essential (primary) hypertension: Secondary | ICD-10-CM | POA: Diagnosis not present

## 2017-11-27 DIAGNOSIS — E46 Unspecified protein-calorie malnutrition: Secondary | ICD-10-CM | POA: Diagnosis not present

## 2017-11-27 DIAGNOSIS — E039 Hypothyroidism, unspecified: Secondary | ICD-10-CM | POA: Diagnosis not present

## 2017-11-27 DIAGNOSIS — I10 Essential (primary) hypertension: Secondary | ICD-10-CM | POA: Diagnosis not present

## 2017-11-27 DIAGNOSIS — G309 Alzheimer's disease, unspecified: Secondary | ICD-10-CM | POA: Diagnosis not present

## 2017-11-27 DIAGNOSIS — E785 Hyperlipidemia, unspecified: Secondary | ICD-10-CM | POA: Diagnosis not present

## 2017-12-03 DIAGNOSIS — G309 Alzheimer's disease, unspecified: Secondary | ICD-10-CM | POA: Diagnosis not present

## 2017-12-03 DIAGNOSIS — E785 Hyperlipidemia, unspecified: Secondary | ICD-10-CM | POA: Diagnosis not present

## 2017-12-03 DIAGNOSIS — E039 Hypothyroidism, unspecified: Secondary | ICD-10-CM | POA: Diagnosis not present

## 2017-12-03 DIAGNOSIS — I10 Essential (primary) hypertension: Secondary | ICD-10-CM | POA: Diagnosis not present

## 2017-12-03 DIAGNOSIS — E46 Unspecified protein-calorie malnutrition: Secondary | ICD-10-CM | POA: Diagnosis not present

## 2017-12-04 DIAGNOSIS — I10 Essential (primary) hypertension: Secondary | ICD-10-CM | POA: Diagnosis not present

## 2017-12-04 DIAGNOSIS — E46 Unspecified protein-calorie malnutrition: Secondary | ICD-10-CM | POA: Diagnosis not present

## 2017-12-04 DIAGNOSIS — E785 Hyperlipidemia, unspecified: Secondary | ICD-10-CM | POA: Diagnosis not present

## 2017-12-04 DIAGNOSIS — E039 Hypothyroidism, unspecified: Secondary | ICD-10-CM | POA: Diagnosis not present

## 2017-12-04 DIAGNOSIS — G309 Alzheimer's disease, unspecified: Secondary | ICD-10-CM | POA: Diagnosis not present

## 2017-12-10 DIAGNOSIS — E46 Unspecified protein-calorie malnutrition: Secondary | ICD-10-CM | POA: Diagnosis not present

## 2017-12-10 DIAGNOSIS — G309 Alzheimer's disease, unspecified: Secondary | ICD-10-CM | POA: Diagnosis not present

## 2017-12-10 DIAGNOSIS — E785 Hyperlipidemia, unspecified: Secondary | ICD-10-CM | POA: Diagnosis not present

## 2017-12-10 DIAGNOSIS — E039 Hypothyroidism, unspecified: Secondary | ICD-10-CM | POA: Diagnosis not present

## 2017-12-10 DIAGNOSIS — I10 Essential (primary) hypertension: Secondary | ICD-10-CM | POA: Diagnosis not present

## 2017-12-12 DIAGNOSIS — I10 Essential (primary) hypertension: Secondary | ICD-10-CM | POA: Diagnosis not present

## 2017-12-12 DIAGNOSIS — E039 Hypothyroidism, unspecified: Secondary | ICD-10-CM | POA: Diagnosis not present

## 2017-12-12 DIAGNOSIS — G309 Alzheimer's disease, unspecified: Secondary | ICD-10-CM | POA: Diagnosis not present

## 2017-12-12 DIAGNOSIS — E46 Unspecified protein-calorie malnutrition: Secondary | ICD-10-CM | POA: Diagnosis not present

## 2017-12-12 DIAGNOSIS — E785 Hyperlipidemia, unspecified: Secondary | ICD-10-CM | POA: Diagnosis not present

## 2017-12-13 DIAGNOSIS — E785 Hyperlipidemia, unspecified: Secondary | ICD-10-CM | POA: Diagnosis not present

## 2017-12-13 DIAGNOSIS — I10 Essential (primary) hypertension: Secondary | ICD-10-CM | POA: Diagnosis not present

## 2017-12-13 DIAGNOSIS — E46 Unspecified protein-calorie malnutrition: Secondary | ICD-10-CM | POA: Diagnosis not present

## 2017-12-13 DIAGNOSIS — G309 Alzheimer's disease, unspecified: Secondary | ICD-10-CM | POA: Diagnosis not present

## 2017-12-13 DIAGNOSIS — E039 Hypothyroidism, unspecified: Secondary | ICD-10-CM | POA: Diagnosis not present

## 2017-12-15 DIAGNOSIS — G309 Alzheimer's disease, unspecified: Secondary | ICD-10-CM | POA: Diagnosis not present

## 2017-12-15 DIAGNOSIS — E46 Unspecified protein-calorie malnutrition: Secondary | ICD-10-CM | POA: Diagnosis not present

## 2017-12-15 DIAGNOSIS — E785 Hyperlipidemia, unspecified: Secondary | ICD-10-CM | POA: Diagnosis not present

## 2017-12-15 DIAGNOSIS — E039 Hypothyroidism, unspecified: Secondary | ICD-10-CM | POA: Diagnosis not present

## 2017-12-15 DIAGNOSIS — I10 Essential (primary) hypertension: Secondary | ICD-10-CM | POA: Diagnosis not present

## 2017-12-17 DIAGNOSIS — E039 Hypothyroidism, unspecified: Secondary | ICD-10-CM | POA: Diagnosis not present

## 2017-12-17 DIAGNOSIS — E46 Unspecified protein-calorie malnutrition: Secondary | ICD-10-CM | POA: Diagnosis not present

## 2017-12-17 DIAGNOSIS — I10 Essential (primary) hypertension: Secondary | ICD-10-CM | POA: Diagnosis not present

## 2017-12-17 DIAGNOSIS — G309 Alzheimer's disease, unspecified: Secondary | ICD-10-CM | POA: Diagnosis not present

## 2017-12-17 DIAGNOSIS — E785 Hyperlipidemia, unspecified: Secondary | ICD-10-CM | POA: Diagnosis not present

## 2017-12-19 ENCOUNTER — Non-Acute Institutional Stay: Payer: Medicare Other | Admitting: Adult Health

## 2017-12-19 DIAGNOSIS — F0280 Dementia in other diseases classified elsewhere without behavioral disturbance: Secondary | ICD-10-CM

## 2017-12-19 DIAGNOSIS — F329 Major depressive disorder, single episode, unspecified: Secondary | ICD-10-CM

## 2017-12-19 DIAGNOSIS — G8929 Other chronic pain: Secondary | ICD-10-CM

## 2017-12-19 DIAGNOSIS — K5901 Slow transit constipation: Secondary | ICD-10-CM | POA: Diagnosis not present

## 2017-12-19 DIAGNOSIS — F5101 Primary insomnia: Secondary | ICD-10-CM

## 2017-12-19 DIAGNOSIS — R52 Pain, unspecified: Secondary | ICD-10-CM | POA: Diagnosis not present

## 2017-12-19 DIAGNOSIS — F0391 Unspecified dementia with behavioral disturbance: Secondary | ICD-10-CM

## 2017-12-19 DIAGNOSIS — R634 Abnormal weight loss: Secondary | ICD-10-CM | POA: Diagnosis not present

## 2017-12-19 DIAGNOSIS — F0392 Unspecified dementia, unspecified severity, with psychotic disturbance: Secondary | ICD-10-CM

## 2017-12-19 DIAGNOSIS — F0393 Unspecified dementia, unspecified severity, with mood disturbance: Secondary | ICD-10-CM

## 2017-12-20 ENCOUNTER — Encounter: Payer: Self-pay | Admitting: Adult Health

## 2017-12-20 NOTE — Assessment & Plan Note (Signed)
Continue melatonin 5 mg qhs 

## 2017-12-20 NOTE — Assessment & Plan Note (Signed)
Improvement in behaviors over time. Continue depakote, seroquel, and ativan.

## 2017-12-20 NOTE — Assessment & Plan Note (Signed)
Continue Miralax 17 grams qd and senokot s 1 tab BID

## 2017-12-20 NOTE — Assessment & Plan Note (Addendum)
This is expected and due to her progressive dementia and poor intake. Continue Boost 1 can BID.

## 2017-12-20 NOTE — Assessment & Plan Note (Signed)
Improved. Continue Effexor 75 mg BID.

## 2017-12-20 NOTE — Progress Notes (Signed)
Location:  Medical illustrator of Service:  ALF (13) Provider:   Peggye Ley, ANP [facility] [phone]   Kermit Balo, DO  Patient Care Team: Kermit Balo, DO as PCP - General (Geriatric Medicine) [facility], [facility] Venancio Poisson, Guy Sandifer, MD as Consulting Physician (Orthopedic Surgery) Archer Asa, MD as Consulting Physician (Psychiatry)  Extended Emergency Contact Information Primary Emergency Contact: [name],[name] Address: [phone]          [place] [address] [place] Home Phone: [phone] Mobile Phone: [phone] Relation: None  Code Status: DNR Goals of care: Advanced Directive information Advanced Directives 09/09/2017  Does Patient Have a Medical Advance Directive? Yes  Type of Estate agent of [place];Out of facility DNR (pink MOST or yellow form)  Does patient want to make changes to medical advance directive? No - Patient declined  Copy of Healthcare Power of Attorney in Chart? Yes  Would patient like information on creating a medical advance directive? No - Patient declined  Pre-existing out of facility DNR order (yellow form or pink MOST form) Yellow form placed in chart (order not valid for inpatient use);Pink MOST form placed in chart (order not valid for inpatient use)     Chief Complaint  Patient presents with  . Medical Management of Chronic Issues    HPI:  Pt is a 84 y.o. female seen today for medical management of chronic diseases.  She resides in memory care due to advanced dementia. She is followed by hospice.  Dementia: felt to be Alzheimer's type.  MRI in 2014 showed mild diffuse atrophy.  She has progressively declined and is no longer ambulatory and needs assistance with all ADLs.  Previous issues of agitation and hallucinations have improved. Seroquel was decreased recently due to lethargy.  Weight loss: continues to lose weight due to  decreased intake Wt Readings from Last 3 Encounters:  12/20/17 122 lb 12.8 oz (55.7 kg)  10/22/17 129 lb 9.6 oz (58.8 kg)  09/09/17 133 lb (60.3 kg)   Insomnia: no reports of not sleeping, takes melatonin  Constipation: takes miralax and senokot s but occasionally needs warm prune juice or supp  Back pain: extensive surgical hx, DDD noted on previous xrays done at the facility   Past Medical History:  Diagnosis Date  . Backache, unspecified 04/10/2009  . Cervicalgia 09/12/2005  . Closed fracture of unspecified part of upper end of humerus 09/28/2013   Right proximal humerus fracture 09/24/2013  . Delusion (HCC) 06/04/2013  . Herpes zoster with other nervous system complications(053.19) 09/12/2008  . Memory deficit   . Other and unspecified hyperlipidemia 07/10/2009  . Other malaise and fatigue 10/09/2009  . Rash and other nonspecific skin eruption 11/02/2007  . Senile osteoporosis 07/09/2006  . Shortness of breath 11/02/2007  . Unspecified constipation   . Unspecified essential hypertension 11/02/2007  . Unspecified hearing loss 12/05/2008  . Unspecified hypothyroidism 06/05/2005  . Unspecified vitamin D deficiency 02/03/2012   Past Surgical History:  Procedure Laterality Date  . BACK SURGERY  2006   Dr. Lequita Halt  . CATARACT EXTRACTION, BILATERAL    . KIDNEY STONE SURGERY  1959   Dr. Elige Radon  . LAMINECTOMY  03/20/2005   Centtal with Bil L3 & L4 nerve root decompression  Dr. Otelia Sergeant  . SPINE SURGERY  1991   Dr. Otelia Sergeant  . SPINE SURGERY  1996   Dr. Salli Real  . TOTAL KNEE ARTHROPLASTY Left 09/15/2002  . TOTAL  KNEE ARTHROPLASTY  2000   Dr. Jerl Santosalldorf    Allergies  Allergen Reactions  . Codeine   . Darvocet [Propoxyphene N-Acetaminophen]   . Erythromycin   . Hydantoins   . Lyrica [Pregabalin]   . Morphine And Related   . Penicillins   . Percocet [Oxycodone-Acetaminophen]   . Quaternium-15   . Sulfa Antibiotics   . Sulfites   . Tobramycin-Dexamethasone     Outpatient Encounter  Medications as of 12/19/2017  Medication Sig  . acetaminophen (TYLENOL) 325 MG tablet Take 650 mg 2 (two) times daily by mouth.  . bisacodyl (DULCOLAX) 10 MG suppository Place 10 mg every other day rectally.   . lactose free nutrition (BOOST) LIQD Take 237 mLs by mouth 2 (two) times daily between meals.  Marland Kitchen. levothyroxine (SYNTHROID, LEVOTHROID) 75 MCG tablet Take 75 mcg by mouth daily before breakfast.  . LORazepam (ATIVAN) 0.5 MG tablet Take 0.5 mg 2 (two) times daily by mouth.   . Melatonin 5 MG TABS Take 5 mg by mouth at bedtime.  . polyethylene glycol powder (GLYCOLAX/MIRALAX) powder Take 17 g by mouth daily.  . QUEtiapine (SEROQUEL) 50 MG tablet Take 50 mg by mouth 2 (two) times daily.   . sennosides-docusate sodium (SENOKOT-S) 8.6-50 MG tablet Take 1 tablet 2 (two) times daily by mouth.   . Valproate Sodium (DEPAKENE) 250 MG/5ML SOLN solution Take 250 mg by mouth 2 (two) times daily.   Marland Kitchen. venlafaxine (EFFEXOR) 75 MG tablet Take 75 mg by mouth 2 (two) times daily.   No facility-administered encounter medications on file as of 12/19/2017.     Review of Systems  Unable to perform ROS: Dementia    Immunization History  Administered Date(s) Administered  . Influenza Inj Mdck Quad Pf 08/01/2016  . Influenza Whole 10/24/2011  . Influenza-Unspecified 07/27/2013, 07/19/2014, 07/27/2015, 08/07/2017  . PPD Test 06/19/2013  . Pneumococcal Conjugate-13 01/17/2015  . Zoster 05/05/2006   Pertinent  Health Maintenance Due  Topic Date Due  . INFLUENZA VACCINE  Completed  . DEXA SCAN  Completed  . PNA vac Low Risk Adult  Completed   Fall Risk  09/09/2017 08/17/2014  Falls in the past year? No No  Risk for fall due to : - History of fall(s)  Risk for fall due to: Comment - 09-24-2013 broke right shoulder   Functional Status Survey:    Vitals:   12/20/17 0754  Weight: 122 lb 12.8 oz (55.7 kg)   Body mass index is 23.2 kg/m.  Wt Readings from Last 3 Encounters:  12/20/17 122 lb 12.8 oz  (55.7 kg)  10/22/17 129 lb 9.6 oz (58.8 kg)  09/09/17 133 lb (60.3 kg)    Physical Exam  Constitutional: No distress.  HENT:  Head: Normocephalic and atraumatic.  Nose: Nose normal.  Mouth/Throat: No oropharyngeal exudate.  Neck: No JVD present.  Cardiovascular: Normal rate and regular rhythm.  No murmur heard. Pulmonary/Chest: Effort normal and breath sounds normal. No respiratory distress. She has no wheezes.  Abdominal: Soft. Bowel sounds are normal. She exhibits no distension.  Musculoskeletal: She exhibits no edema or tenderness.  kyphosis  Lymphadenopathy:    She has no cervical adenopathy.  Neurological: She is alert.  Talking but not answering q's or following commands  Skin: Skin is warm and dry. She is not diaphoretic.  Psychiatric: She has a normal mood and affect.  Vitals reviewed.   Labs reviewed: Recent Labs    02/14/17 0600  NA 145  K 3.8  BUN 23*  CREATININE 0.7   Recent Labs    02/14/17 0600  AST 20  ALT 14  ALKPHOS 83   No results for input(s): WBC, NEUTROABS, HGB, HCT, MCV, PLT in the last 8760 hours. Lab Results  Component Value Date   TSH 0.57 11/14/2016   Lab Results  Component Value Date   HGBA1C 5.6 03/07/2016   Lab Results  Component Value Date   CHOL 211 (A) 09/02/2015   HDL 47 09/02/2015   LDLCALC 136 09/02/2015   TRIG 140 09/02/2015    Significant Diagnostic Results in last 30 days:  No results found.  Assessment/Plan  Dementia with psychosis Improvement in behaviors over time. Continue depakote, seroquel, and ativan.   Depression due to dementia Improved. Continue Effexor 75 mg BID.   Weight loss This is expected and due to her progressive dementia and poor intake. Continue Boost 1 can BID.   Constipation Continue Miralax 17 grams qd and senokot s 1 tab BID  Chronic generalized pain Controlled. Continue tylenol 650 mg BID.  Insomnia Continue melatonin 5 mg qhs   Family/ staff Communication: discussed with  CNA and nurse  Labs/tests ordered:  No routine screening labs due to goals of care.

## 2017-12-20 NOTE — Assessment & Plan Note (Signed)
Controlled. Continue tylenol 650 mg BID.

## 2017-12-24 DIAGNOSIS — E785 Hyperlipidemia, unspecified: Secondary | ICD-10-CM | POA: Diagnosis not present

## 2017-12-24 DIAGNOSIS — G309 Alzheimer's disease, unspecified: Secondary | ICD-10-CM | POA: Diagnosis not present

## 2017-12-24 DIAGNOSIS — E46 Unspecified protein-calorie malnutrition: Secondary | ICD-10-CM | POA: Diagnosis not present

## 2017-12-24 DIAGNOSIS — E039 Hypothyroidism, unspecified: Secondary | ICD-10-CM | POA: Diagnosis not present

## 2017-12-24 DIAGNOSIS — I10 Essential (primary) hypertension: Secondary | ICD-10-CM | POA: Diagnosis not present

## 2017-12-29 DIAGNOSIS — G309 Alzheimer's disease, unspecified: Secondary | ICD-10-CM | POA: Diagnosis not present

## 2017-12-29 DIAGNOSIS — E785 Hyperlipidemia, unspecified: Secondary | ICD-10-CM | POA: Diagnosis not present

## 2017-12-29 DIAGNOSIS — I10 Essential (primary) hypertension: Secondary | ICD-10-CM | POA: Diagnosis not present

## 2017-12-29 DIAGNOSIS — E039 Hypothyroidism, unspecified: Secondary | ICD-10-CM | POA: Diagnosis not present

## 2017-12-29 DIAGNOSIS — E46 Unspecified protein-calorie malnutrition: Secondary | ICD-10-CM | POA: Diagnosis not present

## 2018-01-02 DIAGNOSIS — G309 Alzheimer's disease, unspecified: Secondary | ICD-10-CM | POA: Diagnosis not present

## 2018-01-02 DIAGNOSIS — E039 Hypothyroidism, unspecified: Secondary | ICD-10-CM | POA: Diagnosis not present

## 2018-01-02 DIAGNOSIS — I10 Essential (primary) hypertension: Secondary | ICD-10-CM | POA: Diagnosis not present

## 2018-01-02 DIAGNOSIS — E785 Hyperlipidemia, unspecified: Secondary | ICD-10-CM | POA: Diagnosis not present

## 2018-01-02 DIAGNOSIS — E46 Unspecified protein-calorie malnutrition: Secondary | ICD-10-CM | POA: Diagnosis not present

## 2018-01-09 DIAGNOSIS — E46 Unspecified protein-calorie malnutrition: Secondary | ICD-10-CM | POA: Diagnosis not present

## 2018-01-09 DIAGNOSIS — E785 Hyperlipidemia, unspecified: Secondary | ICD-10-CM | POA: Diagnosis not present

## 2018-01-09 DIAGNOSIS — I10 Essential (primary) hypertension: Secondary | ICD-10-CM | POA: Diagnosis not present

## 2018-01-09 DIAGNOSIS — G309 Alzheimer's disease, unspecified: Secondary | ICD-10-CM | POA: Diagnosis not present

## 2018-01-09 DIAGNOSIS — E039 Hypothyroidism, unspecified: Secondary | ICD-10-CM | POA: Diagnosis not present

## 2018-01-12 DIAGNOSIS — I10 Essential (primary) hypertension: Secondary | ICD-10-CM | POA: Diagnosis not present

## 2018-01-12 DIAGNOSIS — E46 Unspecified protein-calorie malnutrition: Secondary | ICD-10-CM | POA: Diagnosis not present

## 2018-01-12 DIAGNOSIS — E039 Hypothyroidism, unspecified: Secondary | ICD-10-CM | POA: Diagnosis not present

## 2018-01-12 DIAGNOSIS — G309 Alzheimer's disease, unspecified: Secondary | ICD-10-CM | POA: Diagnosis not present

## 2018-01-12 DIAGNOSIS — E785 Hyperlipidemia, unspecified: Secondary | ICD-10-CM | POA: Diagnosis not present

## 2018-01-13 DIAGNOSIS — G309 Alzheimer's disease, unspecified: Secondary | ICD-10-CM | POA: Diagnosis not present

## 2018-01-13 DIAGNOSIS — I10 Essential (primary) hypertension: Secondary | ICD-10-CM | POA: Diagnosis not present

## 2018-01-13 DIAGNOSIS — E46 Unspecified protein-calorie malnutrition: Secondary | ICD-10-CM | POA: Diagnosis not present

## 2018-01-13 DIAGNOSIS — E039 Hypothyroidism, unspecified: Secondary | ICD-10-CM | POA: Diagnosis not present

## 2018-01-13 DIAGNOSIS — E785 Hyperlipidemia, unspecified: Secondary | ICD-10-CM | POA: Diagnosis not present

## 2018-01-14 DIAGNOSIS — I10 Essential (primary) hypertension: Secondary | ICD-10-CM | POA: Diagnosis not present

## 2018-01-14 DIAGNOSIS — E785 Hyperlipidemia, unspecified: Secondary | ICD-10-CM | POA: Diagnosis not present

## 2018-01-14 DIAGNOSIS — E46 Unspecified protein-calorie malnutrition: Secondary | ICD-10-CM | POA: Diagnosis not present

## 2018-01-14 DIAGNOSIS — E039 Hypothyroidism, unspecified: Secondary | ICD-10-CM | POA: Diagnosis not present

## 2018-01-14 DIAGNOSIS — G309 Alzheimer's disease, unspecified: Secondary | ICD-10-CM | POA: Diagnosis not present

## 2018-01-20 DIAGNOSIS — E785 Hyperlipidemia, unspecified: Secondary | ICD-10-CM | POA: Diagnosis not present

## 2018-01-20 DIAGNOSIS — E46 Unspecified protein-calorie malnutrition: Secondary | ICD-10-CM | POA: Diagnosis not present

## 2018-01-20 DIAGNOSIS — I10 Essential (primary) hypertension: Secondary | ICD-10-CM | POA: Diagnosis not present

## 2018-01-20 DIAGNOSIS — E039 Hypothyroidism, unspecified: Secondary | ICD-10-CM | POA: Diagnosis not present

## 2018-01-20 DIAGNOSIS — G309 Alzheimer's disease, unspecified: Secondary | ICD-10-CM | POA: Diagnosis not present

## 2018-01-27 ENCOUNTER — Non-Acute Institutional Stay: Payer: Medicare Other | Admitting: Internal Medicine

## 2018-01-27 ENCOUNTER — Encounter: Payer: Self-pay | Admitting: Internal Medicine

## 2018-01-27 DIAGNOSIS — E785 Hyperlipidemia, unspecified: Secondary | ICD-10-CM | POA: Diagnosis not present

## 2018-01-27 DIAGNOSIS — K5901 Slow transit constipation: Secondary | ICD-10-CM

## 2018-01-27 DIAGNOSIS — E038 Other specified hypothyroidism: Secondary | ICD-10-CM | POA: Diagnosis not present

## 2018-01-27 DIAGNOSIS — R634 Abnormal weight loss: Secondary | ICD-10-CM | POA: Diagnosis not present

## 2018-01-27 DIAGNOSIS — E039 Hypothyroidism, unspecified: Secondary | ICD-10-CM | POA: Diagnosis not present

## 2018-01-27 DIAGNOSIS — R627 Adult failure to thrive: Secondary | ICD-10-CM

## 2018-01-27 DIAGNOSIS — F0392 Unspecified dementia, unspecified severity, with psychotic disturbance: Secondary | ICD-10-CM

## 2018-01-27 DIAGNOSIS — F0391 Unspecified dementia with behavioral disturbance: Secondary | ICD-10-CM

## 2018-01-27 DIAGNOSIS — E46 Unspecified protein-calorie malnutrition: Secondary | ICD-10-CM | POA: Diagnosis not present

## 2018-01-27 DIAGNOSIS — I10 Essential (primary) hypertension: Secondary | ICD-10-CM | POA: Diagnosis not present

## 2018-01-27 DIAGNOSIS — G309 Alzheimer's disease, unspecified: Secondary | ICD-10-CM | POA: Diagnosis not present

## 2018-01-27 NOTE — Progress Notes (Signed)
Patient ID: [name], female   DOB: 03-Sep-1927, 84 y.o.   MRN: [id number]  Location:  [facility] Room Number: 307 Place of Service:  ALF [phone]) Provider:  Rosine Door, RN, DNP Student/ Kermit Balo, DO  Patient Care Team: Kermit Balo, DO as PCP - General (Geriatric Medicine) [facility], [facility] Venancio Poisson, Guy Sandifer, MD as Consulting Physician (Orthopedic Surgery) Archer Asa, MD as Consulting Physician (Psychiatry)  Extended Emergency Contact Information Primary Emergency Contact: [name],[name] Address: [phone]          [place] [address] [place] Home Phone: [phone] Mobile Phone: [phone] Relation: None  Code Status: DNR Goals of care: Advanced Directive information Advanced Directives 01/27/2018  Does Patient Have a Medical Advance Directive? Yes  Type of Estate agent of [place];Living will;Out of facility DNR (pink MOST or yellow form)  Does patient want to make changes to medical advance directive? No - Patient declined  Copy of Healthcare Power of Attorney in Chart? Yes  Would patient like information on creating a medical advance directive? -  Pre-existing out of facility DNR order (yellow form or pink MOST form) Yellow form placed in chart (order not valid for inpatient use)     Chief Complaint  Patient presents with  . Medical Management of Chronic Issues    Routine Visit    HPI:  Pt is a 84 y.o. female seen today for medical management of chronic diseases.   Seen today in memory care, she lives there secondary to advanced dementia. She is followed by hospice.  Her major issues today concern weight loss and further decline in her cognition.   Weight loss: She continues to lose weight even with boost supplement. She has adult failure to thrive. Her last readings are 117 (Apr/19), 122 (Mar/19), 133 (Nov/2019).   Dementia: felt to be  Alzheimer's type.  MRI in 2014 showed mild diffuse atrophy.  She has progressively declined and is no longer ambulatory and needs assistance with all ADLs.  Previous issues of agitation and hallucinations have improved. Seroquel was decreased recently due to lethargy.  Per nursing: It is reported that she has started sleeping more and is not engaging in care. She has further reduced the amount of food she is eating, Boost at times is getting in her. She has constipation program, which is doing well. She can get a Ducolax supp if needed. She is incontinent of urine and wears a brief. She has no skin breakdown, even with her more sedentary and dependence of ADL's. She requires a Nurse, adult for transfers. It is hard to assess confusion as she is not as verbal she once was and her behaviors have started to display more withdraw of care, versus outburst or hallucinations.       Past Medical History:  Diagnosis Date  . Backache, unspecified 04/10/2009  . Cervicalgia 09/12/2005  . Closed fracture of unspecified part of upper end of humerus 09/28/2013   Right proximal humerus fracture 09/24/2013  . Delusion (HCC) 06/04/2013  . Herpes zoster with other nervous system complications(053.19) 09/12/2008  . Memory deficit   . Other and unspecified hyperlipidemia 07/10/2009  . Other malaise and fatigue 10/09/2009  . Rash and other nonspecific skin eruption 11/02/2007  . Senile osteoporosis 07/09/2006  . Shortness of breath 11/02/2007  . Unspecified constipation   . Unspecified essential hypertension 11/02/2007  . Unspecified hearing loss 12/05/2008  . Unspecified hypothyroidism 06/05/2005  .  Unspecified vitamin D deficiency 02/03/2012   Past Surgical History:  Procedure Laterality Date  . BACK SURGERY  2006   Dr. Lequita HaltAluisio  . CATARACT EXTRACTION, BILATERAL    . KIDNEY STONE SURGERY  1959   Dr. Elige RadonBradley  . LAMINECTOMY  03/20/2005   Centtal with Bil L3 & L4 nerve root decompression  Dr. Otelia SergeantNitka  . SPINE SURGERY   1991   Dr. Otelia SergeantNitka  . SPINE SURGERY  1996   Dr. Salli RealNitak  . TOTAL KNEE ARTHROPLASTY Left 09/15/2002  . TOTAL KNEE ARTHROPLASTY  2000   Dr. Jerl Santosalldorf    Allergies  Allergen Reactions  . Codeine   . Darvocet [Propoxyphene N-Acetaminophen]   . Erythromycin   . Hydantoins   . Lyrica [Pregabalin]   . Morphine And Related   . Penicillins   . Percocet [Oxycodone-Acetaminophen]   . Quaternium-15   . Sulfa Antibiotics   . Sulfites   . Tobramycin-Dexamethasone     Outpatient Encounter Medications as of 01/27/2018  Medication Sig  . acetaminophen (TYLENOL) 325 MG tablet Take 650 mg 2 (two) times daily by mouth.  . lactose free nutrition (BOOST) LIQD Take 237 mLs by mouth 2 (two) times daily between meals.  Marland Kitchen. levothyroxine (SYNTHROID, LEVOTHROID) 75 MCG tablet Take 75 mcg by mouth daily before breakfast.  . LORazepam (ATIVAN) 0.5 MG tablet Take 0.5 mg 2 (two) times daily by mouth.   . Melatonin 5 MG TABS Take 5 mg by mouth at bedtime.  . polyethylene glycol powder (GLYCOLAX/MIRALAX) powder Take 17 g by mouth daily.  . QUEtiapine (SEROQUEL) 50 MG tablet Take 50 mg by mouth 2 (two) times daily.   . sennosides-docusate sodium (SENOKOT-S) 8.6-50 MG tablet Take 1 tablet 2 (two) times daily by mouth.   . Valproate Sodium (DEPAKENE) 250 MG/5ML SOLN solution Take 250 mg by mouth 2 (two) times daily.   Marland Kitchen. venlafaxine (EFFEXOR) 75 MG tablet Take 75 mg by mouth 2 (two) times daily.  . [DISCONTINUED] bisacodyl (DULCOLAX) 10 MG suppository Place 10 mg every other day rectally.    No facility-administered encounter medications on file as of 01/27/2018.     Review of Systems  Constitutional: Positive for activity change, appetite change and fatigue.  HENT: Negative.   Eyes: Negative.   Respiratory: Negative for cough, chest tightness and shortness of breath.   Cardiovascular: Negative for chest pain, palpitations and leg swelling.  Gastrointestinal: Negative for abdominal pain, blood in stool,  constipation and diarrhea.  Genitourinary: Negative for dyspareunia, dysuria and hematuria.  Musculoskeletal: Positive for gait problem.  Skin: Positive for pallor.  Neurological: Negative for dizziness and headaches.  Psychiatric/Behavioral: Positive for confusion. Negative for behavioral problems.       Sleeping more    Immunization History  Administered Date(s) Administered  . Influenza Inj Mdck Quad Pf 08/01/2016  . Influenza Whole 10/24/2011  . Influenza-Unspecified 07/27/2013, 07/19/2014, 07/27/2015, 08/07/2017  . PPD Test 06/19/2013  . Pneumococcal Conjugate-13 01/17/2015  . Pneumococcal Polysaccharide-23 09/26/2017  . Td 09/25/2017  . Zoster 05/05/2006  . Zoster Recombinat (Shingrix) 09/05/2017, 01/06/2018   Pertinent  Health Maintenance Due  Topic Date Due  . INFLUENZA VACCINE  05/14/2018  . DEXA SCAN  Completed  . PNA vac Low Risk Adult  Completed   Fall Risk  09/09/2017 08/17/2014  Falls in the past year? No No  Risk for fall due to : - History of fall(s)  Risk for fall due to: Comment - 09-24-2013 broke right shoulder   Functional Status  Survey:    Vitals:   01/27/18 1323  BP: 118/72  Pulse: 61  Resp: 19  Temp: (!) 97.2 F (36.2 C)  TempSrc: Oral  SpO2: 95%  Weight: 117 lb (53.1 kg)  Height: 5\' 1"  (1.549 m)   Body mass index is 22.11 kg/m. Physical Exam  Constitutional: She appears lethargic.  Thin frail female  HENT:  Head: Normocephalic and atraumatic.  Right Ear: External ear normal.  Left Ear: External ear normal.  Neck: Normal range of motion. Neck supple.  Cardiovascular: Normal rate, regular rhythm, normal heart sounds and intact distal pulses.  No murmur heard. Pulmonary/Chest: Effort normal and breath sounds normal. No respiratory distress.  Abdominal: Bowel sounds are normal. She exhibits no distension.  Neurological: She appears lethargic.  Did not talk even when aroused  Skin: Skin is warm and dry. There is pallor.  Psychiatric:    Unable to assess-very sleepy during exam    Labs reviewed: Recent Labs    02/14/17 0600  NA 145  K 3.8  BUN 23*  CREATININE 0.7   Recent Labs    02/14/17 0600  AST 20  ALT 14  ALKPHOS 83   No results for input(s): WBC, NEUTROABS, HGB, HCT, MCV, PLT in the last 8760 hours. Lab Results  Component Value Date   TSH 0.44 11/17/2017   Lab Results  Component Value Date   HGBA1C 5.6 03/07/2016   Lab Results  Component Value Date   CHOL 211 (A) 09/02/2015   HDL 47 09/02/2015   LDLCALC 136 09/02/2015   TRIG 140 09/02/2015    Significant Diagnostic Results in last 30 days:  No results found.  Assessment/Plan  1. Dementia with psychosis She remains on Ativan, Seroquel, Depakote, and Effexor. Unsure how much of a benefit the Ativan and Seroquel are being to her given her decline in behavior. She was previously reduced on her Seroquel months back and she did well with this. She receives these twice daily, will look to reduce dosage versus changing it to once daily.   2. Weight loss She continues to withdraw from eating. She has a boost supplement, but at this point in her care, this is not offering much improvement, but will continue as this does offer some calories when she does not eat her meals. She is followed by hospice.   3. Adult failure to thrive She is demonstrating failure to thrive with changes in behaviors, eating and self-care. She now requires full total care in all ADL's.   4. Other specified hypothyroidism She remains on synthroid. She was having overcorrection in the past months, but this appears to have corrected in Feb 2019. Would like to refrain from lab draws at this time and main this dose unless signs or symptoms demonstrate otherwise.   5. Slow transit constipation This seems managed at this time, though with her decline in eating unsure how much she needs to continue with senna, which could further reduce pill burden.    Family/ staff  Communication: Spoke with nurse.  Labs/tests ordered:  None, would like to refrain from labs given advanced dementia

## 2018-02-05 DIAGNOSIS — E785 Hyperlipidemia, unspecified: Secondary | ICD-10-CM | POA: Diagnosis not present

## 2018-02-05 DIAGNOSIS — E46 Unspecified protein-calorie malnutrition: Secondary | ICD-10-CM | POA: Diagnosis not present

## 2018-02-05 DIAGNOSIS — G309 Alzheimer's disease, unspecified: Secondary | ICD-10-CM | POA: Diagnosis not present

## 2018-02-05 DIAGNOSIS — E039 Hypothyroidism, unspecified: Secondary | ICD-10-CM | POA: Diagnosis not present

## 2018-02-05 DIAGNOSIS — I10 Essential (primary) hypertension: Secondary | ICD-10-CM | POA: Diagnosis not present

## 2018-02-11 DIAGNOSIS — E039 Hypothyroidism, unspecified: Secondary | ICD-10-CM | POA: Diagnosis not present

## 2018-02-11 DIAGNOSIS — E46 Unspecified protein-calorie malnutrition: Secondary | ICD-10-CM | POA: Diagnosis not present

## 2018-02-11 DIAGNOSIS — E785 Hyperlipidemia, unspecified: Secondary | ICD-10-CM | POA: Diagnosis not present

## 2018-02-11 DIAGNOSIS — G309 Alzheimer's disease, unspecified: Secondary | ICD-10-CM | POA: Diagnosis not present

## 2018-02-11 DIAGNOSIS — I10 Essential (primary) hypertension: Secondary | ICD-10-CM | POA: Diagnosis not present

## 2018-02-12 DIAGNOSIS — G309 Alzheimer's disease, unspecified: Secondary | ICD-10-CM | POA: Diagnosis not present

## 2018-02-12 DIAGNOSIS — I10 Essential (primary) hypertension: Secondary | ICD-10-CM | POA: Diagnosis not present

## 2018-02-12 DIAGNOSIS — E039 Hypothyroidism, unspecified: Secondary | ICD-10-CM | POA: Diagnosis not present

## 2018-02-12 DIAGNOSIS — E46 Unspecified protein-calorie malnutrition: Secondary | ICD-10-CM | POA: Diagnosis not present

## 2018-02-12 DIAGNOSIS — E785 Hyperlipidemia, unspecified: Secondary | ICD-10-CM | POA: Diagnosis not present

## 2018-02-16 DIAGNOSIS — G309 Alzheimer's disease, unspecified: Secondary | ICD-10-CM | POA: Diagnosis not present

## 2018-02-16 DIAGNOSIS — I10 Essential (primary) hypertension: Secondary | ICD-10-CM | POA: Diagnosis not present

## 2018-02-16 DIAGNOSIS — E785 Hyperlipidemia, unspecified: Secondary | ICD-10-CM | POA: Diagnosis not present

## 2018-02-16 DIAGNOSIS — E039 Hypothyroidism, unspecified: Secondary | ICD-10-CM | POA: Diagnosis not present

## 2018-02-16 DIAGNOSIS — E46 Unspecified protein-calorie malnutrition: Secondary | ICD-10-CM | POA: Diagnosis not present

## 2018-02-23 DIAGNOSIS — E46 Unspecified protein-calorie malnutrition: Secondary | ICD-10-CM | POA: Diagnosis not present

## 2018-02-23 DIAGNOSIS — E785 Hyperlipidemia, unspecified: Secondary | ICD-10-CM | POA: Diagnosis not present

## 2018-02-23 DIAGNOSIS — E039 Hypothyroidism, unspecified: Secondary | ICD-10-CM | POA: Diagnosis not present

## 2018-02-23 DIAGNOSIS — G309 Alzheimer's disease, unspecified: Secondary | ICD-10-CM | POA: Diagnosis not present

## 2018-02-23 DIAGNOSIS — I10 Essential (primary) hypertension: Secondary | ICD-10-CM | POA: Diagnosis not present

## 2018-02-27 DIAGNOSIS — E039 Hypothyroidism, unspecified: Secondary | ICD-10-CM | POA: Diagnosis not present

## 2018-02-27 DIAGNOSIS — E785 Hyperlipidemia, unspecified: Secondary | ICD-10-CM | POA: Diagnosis not present

## 2018-02-27 DIAGNOSIS — I10 Essential (primary) hypertension: Secondary | ICD-10-CM | POA: Diagnosis not present

## 2018-02-27 DIAGNOSIS — G309 Alzheimer's disease, unspecified: Secondary | ICD-10-CM | POA: Diagnosis not present

## 2018-02-27 DIAGNOSIS — E46 Unspecified protein-calorie malnutrition: Secondary | ICD-10-CM | POA: Diagnosis not present

## 2018-03-04 DIAGNOSIS — B351 Tinea unguium: Secondary | ICD-10-CM | POA: Diagnosis not present

## 2018-03-05 DIAGNOSIS — E785 Hyperlipidemia, unspecified: Secondary | ICD-10-CM | POA: Diagnosis not present

## 2018-03-05 DIAGNOSIS — I10 Essential (primary) hypertension: Secondary | ICD-10-CM | POA: Diagnosis not present

## 2018-03-05 DIAGNOSIS — G309 Alzheimer's disease, unspecified: Secondary | ICD-10-CM | POA: Diagnosis not present

## 2018-03-05 DIAGNOSIS — E46 Unspecified protein-calorie malnutrition: Secondary | ICD-10-CM | POA: Diagnosis not present

## 2018-03-05 DIAGNOSIS — E039 Hypothyroidism, unspecified: Secondary | ICD-10-CM | POA: Diagnosis not present

## 2018-03-10 DIAGNOSIS — E039 Hypothyroidism, unspecified: Secondary | ICD-10-CM | POA: Diagnosis not present

## 2018-03-10 DIAGNOSIS — E46 Unspecified protein-calorie malnutrition: Secondary | ICD-10-CM | POA: Diagnosis not present

## 2018-03-10 DIAGNOSIS — G309 Alzheimer's disease, unspecified: Secondary | ICD-10-CM | POA: Diagnosis not present

## 2018-03-10 DIAGNOSIS — E785 Hyperlipidemia, unspecified: Secondary | ICD-10-CM | POA: Diagnosis not present

## 2018-03-10 DIAGNOSIS — I10 Essential (primary) hypertension: Secondary | ICD-10-CM | POA: Diagnosis not present

## 2018-03-12 DIAGNOSIS — G309 Alzheimer's disease, unspecified: Secondary | ICD-10-CM | POA: Diagnosis not present

## 2018-03-12 DIAGNOSIS — I10 Essential (primary) hypertension: Secondary | ICD-10-CM | POA: Diagnosis not present

## 2018-03-12 DIAGNOSIS — E039 Hypothyroidism, unspecified: Secondary | ICD-10-CM | POA: Diagnosis not present

## 2018-03-12 DIAGNOSIS — E785 Hyperlipidemia, unspecified: Secondary | ICD-10-CM | POA: Diagnosis not present

## 2018-03-12 DIAGNOSIS — E46 Unspecified protein-calorie malnutrition: Secondary | ICD-10-CM | POA: Diagnosis not present

## 2018-03-13 DIAGNOSIS — E46 Unspecified protein-calorie malnutrition: Secondary | ICD-10-CM | POA: Diagnosis not present

## 2018-03-13 DIAGNOSIS — E039 Hypothyroidism, unspecified: Secondary | ICD-10-CM | POA: Diagnosis not present

## 2018-03-13 DIAGNOSIS — I10 Essential (primary) hypertension: Secondary | ICD-10-CM | POA: Diagnosis not present

## 2018-03-13 DIAGNOSIS — G309 Alzheimer's disease, unspecified: Secondary | ICD-10-CM | POA: Diagnosis not present

## 2018-03-13 DIAGNOSIS — E785 Hyperlipidemia, unspecified: Secondary | ICD-10-CM | POA: Diagnosis not present

## 2018-03-14 DIAGNOSIS — E039 Hypothyroidism, unspecified: Secondary | ICD-10-CM | POA: Diagnosis not present

## 2018-03-14 DIAGNOSIS — G309 Alzheimer's disease, unspecified: Secondary | ICD-10-CM | POA: Diagnosis not present

## 2018-03-14 DIAGNOSIS — E46 Unspecified protein-calorie malnutrition: Secondary | ICD-10-CM | POA: Diagnosis not present

## 2018-03-14 DIAGNOSIS — E785 Hyperlipidemia, unspecified: Secondary | ICD-10-CM | POA: Diagnosis not present

## 2018-03-14 DIAGNOSIS — I10 Essential (primary) hypertension: Secondary | ICD-10-CM | POA: Diagnosis not present

## 2018-03-17 DIAGNOSIS — E46 Unspecified protein-calorie malnutrition: Secondary | ICD-10-CM | POA: Diagnosis not present

## 2018-03-17 DIAGNOSIS — I10 Essential (primary) hypertension: Secondary | ICD-10-CM | POA: Diagnosis not present

## 2018-03-17 DIAGNOSIS — E785 Hyperlipidemia, unspecified: Secondary | ICD-10-CM | POA: Diagnosis not present

## 2018-03-17 DIAGNOSIS — E039 Hypothyroidism, unspecified: Secondary | ICD-10-CM | POA: Diagnosis not present

## 2018-03-17 DIAGNOSIS — G309 Alzheimer's disease, unspecified: Secondary | ICD-10-CM | POA: Diagnosis not present

## 2018-03-24 DIAGNOSIS — I10 Essential (primary) hypertension: Secondary | ICD-10-CM | POA: Diagnosis not present

## 2018-03-24 DIAGNOSIS — E785 Hyperlipidemia, unspecified: Secondary | ICD-10-CM | POA: Diagnosis not present

## 2018-03-24 DIAGNOSIS — E46 Unspecified protein-calorie malnutrition: Secondary | ICD-10-CM | POA: Diagnosis not present

## 2018-03-24 DIAGNOSIS — E039 Hypothyroidism, unspecified: Secondary | ICD-10-CM | POA: Diagnosis not present

## 2018-03-24 DIAGNOSIS — G309 Alzheimer's disease, unspecified: Secondary | ICD-10-CM | POA: Diagnosis not present

## 2018-04-02 DIAGNOSIS — E785 Hyperlipidemia, unspecified: Secondary | ICD-10-CM | POA: Diagnosis not present

## 2018-04-02 DIAGNOSIS — I10 Essential (primary) hypertension: Secondary | ICD-10-CM | POA: Diagnosis not present

## 2018-04-02 DIAGNOSIS — G309 Alzheimer's disease, unspecified: Secondary | ICD-10-CM | POA: Diagnosis not present

## 2018-04-02 DIAGNOSIS — E039 Hypothyroidism, unspecified: Secondary | ICD-10-CM | POA: Diagnosis not present

## 2018-04-02 DIAGNOSIS — E46 Unspecified protein-calorie malnutrition: Secondary | ICD-10-CM | POA: Diagnosis not present

## 2018-04-06 ENCOUNTER — Encounter: Payer: Self-pay | Admitting: Adult Health

## 2018-04-06 ENCOUNTER — Non-Acute Institutional Stay (SKILLED_NURSING_FACILITY): Payer: Medicare Other | Admitting: Adult Health

## 2018-04-06 DIAGNOSIS — K5901 Slow transit constipation: Secondary | ICD-10-CM | POA: Diagnosis not present

## 2018-04-06 DIAGNOSIS — G301 Alzheimer's disease with late onset: Secondary | ICD-10-CM

## 2018-04-06 DIAGNOSIS — R634 Abnormal weight loss: Secondary | ICD-10-CM | POA: Diagnosis not present

## 2018-04-06 DIAGNOSIS — I10 Essential (primary) hypertension: Secondary | ICD-10-CM | POA: Diagnosis not present

## 2018-04-06 DIAGNOSIS — E46 Unspecified protein-calorie malnutrition: Secondary | ICD-10-CM | POA: Diagnosis not present

## 2018-04-06 DIAGNOSIS — F02818 Dementia in other diseases classified elsewhere, unspecified severity, with other behavioral disturbance: Secondary | ICD-10-CM

## 2018-04-06 DIAGNOSIS — G309 Alzheimer's disease, unspecified: Secondary | ICD-10-CM | POA: Diagnosis not present

## 2018-04-06 DIAGNOSIS — R52 Pain, unspecified: Secondary | ICD-10-CM

## 2018-04-06 DIAGNOSIS — G8929 Other chronic pain: Secondary | ICD-10-CM

## 2018-04-06 DIAGNOSIS — F0281 Dementia in other diseases classified elsewhere with behavioral disturbance: Secondary | ICD-10-CM | POA: Diagnosis not present

## 2018-04-06 DIAGNOSIS — E785 Hyperlipidemia, unspecified: Secondary | ICD-10-CM | POA: Diagnosis not present

## 2018-04-06 DIAGNOSIS — R269 Unspecified abnormalities of gait and mobility: Secondary | ICD-10-CM | POA: Diagnosis not present

## 2018-04-06 DIAGNOSIS — E039 Hypothyroidism, unspecified: Secondary | ICD-10-CM | POA: Diagnosis not present

## 2018-04-06 NOTE — Progress Notes (Signed)
[facility]   Place of Service:  SNF (31) Provider:   Peggye Leyhristy Bentley Fissel, ANP [facility] [phone]   Kermit Baloeed, Tiffany L, DO  Patient Care Team: Kermit Baloeed, Tiffany L, DO as PCP - General (Geriatric Medicine) [facility], Guy SandiferJames E, MD as Consulting Physician (Orthopedic Surgery) Archer AsaPlovsky, Gerald, MD as Consulting Physician (Psychiatry)  Extended Emergency Contact Information Primary Emergency Contact: [name],[name] Address: [phone]          [place] [id number] [place] Home Phone: [phone] Mobile Phone: [phone] Relation: None  Code Status:  DNR Goals of care: Advanced Directive information Advanced Directives 01/27/2018  Does Patient Have a Medical Advance Directive? Yes  Type of Estate agentAdvance Directive Healthcare Power of [place];Living will;Out of facility DNR (pink MOST or yellow form)  Does patient want to make changes to medical advance directive? No - Patient declined  Copy of Healthcare Power of Attorney in Chart? Yes  Would patient like information on creating a medical advance directive? -  Pre-existing out of facility DNR order (yellow form or pink MOST form) Yellow form placed in chart (order not valid for inpatient use)     Chief Complaint  Patient presents with  . Medical Management of Chronic Issues    HPI:  Pt is a 84 y.o. female seen today for medical management of chronic diseases.   She recently moved to skilled care from memory care due to decline in cognition and function. At first, the nurse reports she had crying episodes but now she has settled in and no longer has that issue. She has advanced dementia and is a hoyer lift and requires total assistance with all ADLs.  She has lost 5 lbs since March due to decreased intake. She failed a previous trial of ativan reduction earlier this year. She spends most of the day in her supportive chair holding her baby and sleeps  more over time.  Past Medical History:  Diagnosis Date  . Backache, unspecified 04/10/2009  . Cervicalgia 09/12/2005  . Closed fracture of unspecified part of upper end of humerus 09/28/2013   Right proximal humerus fracture 09/24/2013  . Delusion (HCC) 06/04/2013  . Herpes zoster with other nervous system complications(053.19) 09/12/2008  . Memory deficit   . Other and unspecified hyperlipidemia 07/10/2009  . Other malaise and fatigue 10/09/2009  . Rash and other nonspecific skin eruption 11/02/2007  . Senile osteoporosis 07/09/2006  . Shortness of breath 11/02/2007  . Unspecified constipation   . Unspecified essential hypertension 11/02/2007  . Unspecified hearing loss 12/05/2008  . Unspecified hypothyroidism 06/05/2005  . Unspecified vitamin D deficiency 02/03/2012   Past Surgical History:  Procedure Laterality Date  . BACK SURGERY  2006   Dr. Lequita HaltAluisio  . CATARACT EXTRACTION, BILATERAL    . KIDNEY STONE SURGERY  1959   Dr. Elige RadonBradley  . LAMINECTOMY  03/20/2005   Centtal with Bil L3 & L4 nerve root decompression  Dr. Otelia SergeantNitka  . SPINE SURGERY  1991   Dr. Otelia SergeantNitka  . SPINE SURGERY  1996   Dr. Salli RealNitak  . TOTAL KNEE ARTHROPLASTY Left 09/15/2002  . TOTAL KNEE ARTHROPLASTY  2000   Dr. Jerl Santosalldorf    Allergies  Allergen Reactions  . Codeine   . Darvocet [Propoxyphene N-Acetaminophen]   . Erythromycin   . Hydantoins   . Lyrica [Pregabalin]   . Morphine And Related   . Penicillins   . Percocet [Oxycodone-Acetaminophen]   . Quaternium-15   .  Sulfa Antibiotics   . Sulfites   . Tobramycin-Dexamethasone     Outpatient Encounter Medications as of 04/06/2018  Medication Sig  . bisacodyl (DULCOLAX) 10 MG suppository Place 10 mg rectally as needed for moderate constipation.  Marland Kitchen acetaminophen (TYLENOL) 325 MG tablet Take 650 mg 2 (two) times daily by mouth.  . lactose free nutrition (BOOST) LIQD Take 237 mLs by mouth 2 (two) times daily between meals.  Marland Kitchen levothyroxine (SYNTHROID, LEVOTHROID) 75 MCG  tablet Take 75 mcg by mouth daily before breakfast.  . LORazepam (ATIVAN) 0.5 MG tablet Take 0.5 mg by mouth 2 (two) times daily. And q 6 hrs prn  . Melatonin 5 MG TABS Take 5 mg by mouth at bedtime.  . polyethylene glycol powder (GLYCOLAX/MIRALAX) powder Take 17 g by mouth daily.  . QUEtiapine (SEROQUEL) 50 MG tablet Take 25 mg by mouth 2 (two) times daily. 25 mg in am and 50 mg in the pm  . sennosides-docusate sodium (SENOKOT-S) 8.6-50 MG tablet Take 1 tablet 2 (two) times daily by mouth.   . Valproate Sodium (DEPAKENE) 250 MG/5ML SOLN solution Take 250 mg by mouth 2 (two) times daily.   Marland Kitchen venlafaxine (EFFEXOR) 75 MG tablet Take 75 mg by mouth 2 (two) times daily.   No facility-administered encounter medications on file as of 04/06/2018.     Review of Systems  Unable to perform ROS: Dementia    Immunization History  Administered Date(s) Administered  . Influenza Inj Mdck Quad Pf 08/01/2016  . Influenza Whole 10/24/2011  . Influenza-Unspecified 07/27/2013, 07/19/2014, 07/27/2015, 08/07/2017  . PPD Test 06/19/2013  . Pneumococcal Conjugate-13 01/17/2015  . Pneumococcal Polysaccharide-23 09/26/2017  . Td 09/25/2017  . Zoster 05/05/2006  . Zoster Recombinat (Shingrix) 09/05/2017, 01/06/2018   Pertinent  Health Maintenance Due  Topic Date Due  . INFLUENZA VACCINE  05/14/2018  . DEXA SCAN  Completed  . PNA vac Low Risk Adult  Completed   Fall Risk  09/09/2017 08/17/2014  Falls in the past year? No No  Risk for fall due to : - History of fall(s)  Risk for fall due to: Comment - 09-24-2013 broke right shoulder   Functional Status Survey:    Vitals:   04/06/18 1530  Weight: 117 lb 3.2 oz (53.2 kg)   Body mass index is 22.14 kg/m.  Wt Readings from Last 3 Encounters:  04/06/18 117 lb 3.2 oz (53.2 kg)  01/27/18 117 lb (53.1 kg)  12/20/17 122 lb 12.8 oz (55.7 kg)   Physical Exam  Constitutional: No distress.  HENT:  Head: Normocephalic and atraumatic.  Neck: No JVD  present.  Cardiovascular: Normal rate and regular rhythm.  Distant HS  Pulmonary/Chest: Effort normal and breath sounds normal.  Abdominal: Soft. Bowel sounds are normal.  Musculoskeletal: She exhibits no edema or tenderness.  Kyphosis. Resist ROM for exam.  Lymphadenopathy:    She has no cervical adenopathy.  Neurological: She is alert.  Mumbles but does not answer questions.   Skin: Skin is warm and dry. She is not diaphoretic.  Psychiatric:  Holds a baby during the exam  Nursing note and vitals reviewed.   Labs reviewed: No results for input(s): NA, K, CL, CO2, GLUCOSE, BUN, CREATININE, CALCIUM, MG, PHOS in the last 8760 hours. No results for input(s): AST, ALT, ALKPHOS, BILITOT, PROT, ALBUMIN in the last 8760 hours. No results for input(s): WBC, NEUTROABS, HGB, HCT, MCV, PLT in the last 8760 hours. Lab Results  Component Value Date   TSH 0.44 11/17/2017  Lab Results  Component Value Date   HGBA1C 5.6 03/07/2016   Lab Results  Component Value Date   CHOL 211 (A) 09/02/2015   HDL 47 09/02/2015   LDLCALC 136 09/02/2015   TRIG 140 09/02/2015    Significant Diagnostic Results in last 30 days:  No results found.  Assessment/Plan  1. Late onset Alzheimer's disease with behavioral disturbance Has adjusted well to her move Progressive decline in cognition and function c/w the disease Continue current regimen of ativan, seroquel and depakote  Followed by hospice  2. Weight loss Due to progressive dementia Continue D2 diet with ground meats Continue Boost BID  3. Chronic generalized pain Long hx of multiple back surgeries Continue scheduled tylenol   4. Slow transit constipation Controlled. Continue senokot, miralax and prn supp.  5. Gait abnormality Hoyer lift for all transfers due to DDD and dementia  6. Essential hypertension Controlled without meds  Family/ staff Communication: staff  Labs/tests ordered:  Depakote level q 6 mo

## 2018-04-13 DIAGNOSIS — G309 Alzheimer's disease, unspecified: Secondary | ICD-10-CM | POA: Diagnosis not present

## 2018-04-13 DIAGNOSIS — I10 Essential (primary) hypertension: Secondary | ICD-10-CM | POA: Diagnosis not present

## 2018-04-13 DIAGNOSIS — E46 Unspecified protein-calorie malnutrition: Secondary | ICD-10-CM | POA: Diagnosis not present

## 2018-04-13 DIAGNOSIS — E785 Hyperlipidemia, unspecified: Secondary | ICD-10-CM | POA: Diagnosis not present

## 2018-04-13 DIAGNOSIS — E039 Hypothyroidism, unspecified: Secondary | ICD-10-CM | POA: Diagnosis not present

## 2018-04-15 DIAGNOSIS — E039 Hypothyroidism, unspecified: Secondary | ICD-10-CM | POA: Diagnosis not present

## 2018-04-15 DIAGNOSIS — I10 Essential (primary) hypertension: Secondary | ICD-10-CM | POA: Diagnosis not present

## 2018-04-15 DIAGNOSIS — E46 Unspecified protein-calorie malnutrition: Secondary | ICD-10-CM | POA: Diagnosis not present

## 2018-04-15 DIAGNOSIS — E785 Hyperlipidemia, unspecified: Secondary | ICD-10-CM | POA: Diagnosis not present

## 2018-04-15 DIAGNOSIS — G309 Alzheimer's disease, unspecified: Secondary | ICD-10-CM | POA: Diagnosis not present

## 2018-04-24 DIAGNOSIS — E46 Unspecified protein-calorie malnutrition: Secondary | ICD-10-CM | POA: Diagnosis not present

## 2018-04-24 DIAGNOSIS — E785 Hyperlipidemia, unspecified: Secondary | ICD-10-CM | POA: Diagnosis not present

## 2018-04-24 DIAGNOSIS — I10 Essential (primary) hypertension: Secondary | ICD-10-CM | POA: Diagnosis not present

## 2018-04-24 DIAGNOSIS — G309 Alzheimer's disease, unspecified: Secondary | ICD-10-CM | POA: Diagnosis not present

## 2018-04-24 DIAGNOSIS — E039 Hypothyroidism, unspecified: Secondary | ICD-10-CM | POA: Diagnosis not present

## 2018-05-01 DIAGNOSIS — E46 Unspecified protein-calorie malnutrition: Secondary | ICD-10-CM | POA: Diagnosis not present

## 2018-05-01 DIAGNOSIS — G309 Alzheimer's disease, unspecified: Secondary | ICD-10-CM | POA: Diagnosis not present

## 2018-05-01 DIAGNOSIS — E785 Hyperlipidemia, unspecified: Secondary | ICD-10-CM | POA: Diagnosis not present

## 2018-05-01 DIAGNOSIS — I10 Essential (primary) hypertension: Secondary | ICD-10-CM | POA: Diagnosis not present

## 2018-05-01 DIAGNOSIS — E039 Hypothyroidism, unspecified: Secondary | ICD-10-CM | POA: Diagnosis not present

## 2018-05-07 DIAGNOSIS — I10 Essential (primary) hypertension: Secondary | ICD-10-CM | POA: Diagnosis not present

## 2018-05-07 DIAGNOSIS — E039 Hypothyroidism, unspecified: Secondary | ICD-10-CM | POA: Diagnosis not present

## 2018-05-07 DIAGNOSIS — G309 Alzheimer's disease, unspecified: Secondary | ICD-10-CM | POA: Diagnosis not present

## 2018-05-07 DIAGNOSIS — E785 Hyperlipidemia, unspecified: Secondary | ICD-10-CM | POA: Diagnosis not present

## 2018-05-07 DIAGNOSIS — E46 Unspecified protein-calorie malnutrition: Secondary | ICD-10-CM | POA: Diagnosis not present

## 2018-05-12 DIAGNOSIS — E785 Hyperlipidemia, unspecified: Secondary | ICD-10-CM | POA: Diagnosis not present

## 2018-05-12 DIAGNOSIS — I10 Essential (primary) hypertension: Secondary | ICD-10-CM | POA: Diagnosis not present

## 2018-05-12 DIAGNOSIS — G309 Alzheimer's disease, unspecified: Secondary | ICD-10-CM | POA: Diagnosis not present

## 2018-05-12 DIAGNOSIS — E039 Hypothyroidism, unspecified: Secondary | ICD-10-CM | POA: Diagnosis not present

## 2018-05-12 DIAGNOSIS — E46 Unspecified protein-calorie malnutrition: Secondary | ICD-10-CM | POA: Diagnosis not present

## 2018-05-14 ENCOUNTER — Non-Acute Institutional Stay (SKILLED_NURSING_FACILITY): Payer: Medicare Other | Admitting: Adult Health

## 2018-05-14 DIAGNOSIS — R634 Abnormal weight loss: Secondary | ICD-10-CM

## 2018-05-14 DIAGNOSIS — F0281 Dementia in other diseases classified elsewhere with behavioral disturbance: Secondary | ICD-10-CM | POA: Diagnosis not present

## 2018-05-14 DIAGNOSIS — E785 Hyperlipidemia, unspecified: Secondary | ICD-10-CM | POA: Diagnosis not present

## 2018-05-14 DIAGNOSIS — I10 Essential (primary) hypertension: Secondary | ICD-10-CM

## 2018-05-14 DIAGNOSIS — F329 Major depressive disorder, single episode, unspecified: Secondary | ICD-10-CM

## 2018-05-14 DIAGNOSIS — R52 Pain, unspecified: Secondary | ICD-10-CM | POA: Diagnosis not present

## 2018-05-14 DIAGNOSIS — G8929 Other chronic pain: Secondary | ICD-10-CM

## 2018-05-14 DIAGNOSIS — E038 Other specified hypothyroidism: Secondary | ICD-10-CM | POA: Diagnosis not present

## 2018-05-14 DIAGNOSIS — G309 Alzheimer's disease, unspecified: Secondary | ICD-10-CM | POA: Diagnosis not present

## 2018-05-14 DIAGNOSIS — E46 Unspecified protein-calorie malnutrition: Secondary | ICD-10-CM | POA: Diagnosis not present

## 2018-05-14 DIAGNOSIS — F0393 Unspecified dementia, unspecified severity, with mood disturbance: Secondary | ICD-10-CM

## 2018-05-14 DIAGNOSIS — G301 Alzheimer's disease with late onset: Secondary | ICD-10-CM | POA: Diagnosis not present

## 2018-05-14 DIAGNOSIS — E039 Hypothyroidism, unspecified: Secondary | ICD-10-CM | POA: Diagnosis not present

## 2018-05-14 DIAGNOSIS — F0280 Dementia in other diseases classified elsewhere without behavioral disturbance: Secondary | ICD-10-CM | POA: Diagnosis not present

## 2018-05-15 ENCOUNTER — Encounter: Payer: Self-pay | Admitting: Adult Health

## 2018-05-15 NOTE — Assessment & Plan Note (Signed)
Continue Effextor 75 mg bid due to periods of anxiety/agitation

## 2018-05-15 NOTE — Assessment & Plan Note (Addendum)
H/o right shoulder pain, OA, lumbar laminectomy and cervicalgia. On scheduled tylenol, controlled

## 2018-05-15 NOTE — Assessment & Plan Note (Signed)
Continue synthroid 75 mcg qd

## 2018-05-15 NOTE — Progress Notes (Signed)
[facility]   Place of Service:  SNF (31) Provider:   Peggye Leyhristy Lillianna Sabel, ANP [facility] [phone]   Kermit Baloeed, Tiffany L, DO  Patient Care Team: Kermit Baloeed, Tiffany L, DO as PCP - General (Geriatric Medicine) [facility], Guy SandiferJames E, MD as Consulting Physician (Orthopedic Surgery) Archer AsaPlovsky, Gerald, MD as Consulting Physician (Psychiatry)  Extended Emergency Contact Information Primary Emergency Contact: [name],[name] Address: [phone]          [place] [id number] [place] Home Phone: [phone] Mobile Phone: [phone] Relation: None  Code Status:  DNR Goals of care: Advanced Directive information Advanced Directives 01/27/2018  Does Patient Have a Medical Advance Directive? Yes  Type of Estate agentAdvance Directive Healthcare Power of [place];Living will;Out of facility DNR (pink MOST or yellow form)  Does patient want to make changes to medical advance directive? No - Patient declined  Copy of Healthcare Power of Attorney in Chart? Yes  Would patient like information on creating a medical advance directive? -  Pre-existing out of facility DNR order (yellow form or pink MOST form) Yellow form placed in chart (order not valid for inpatient use)     Chief Complaint  Patient presents with  . Medical Management of Chronic Issues    HPI:  Pt is a 84 y.o. female seen today for medical management of chronic diseases.   She is followed by hospice due to dementia with progressive weight loss and function/cognitive decline.  AD: Wheel chair bound and dependent on staff for all ADLs.  She uses a baby doll for comfort. Notes in matrix indicates periods of agitation that are relieved with prn ativan. Dep level 29 04/07/18.  Periods of crying have improved per staff.  Weights  Wt Readings from Last 3 Encounters:  05/15/18 114 lb 12.8 oz (52.1 kg)  04/06/18 117 lb 3.2 oz (53.2 kg)  01/27/18 117 lb (53.1 kg)     Depression: on effexor, as above  HTN: controlled, not currently on meds ' Chronic back pain: hx of multiple back surgeries with DDD No reports of pain   Hypothyroidism:  Lab Results  Component Value Date   TSH 0.44 11/17/2017      Past Medical History:  Diagnosis Date  . Backache, unspecified 04/10/2009  . Cervicalgia 09/12/2005  . Closed fracture of unspecified part of upper end of humerus 09/28/2013   Right proximal humerus fracture 09/24/2013  . Delusion (HCC) 06/04/2013  . Herpes zoster with other nervous system complications(053.19) 09/12/2008  . Memory deficit   . Other and unspecified hyperlipidemia 07/10/2009  . Other malaise and fatigue 10/09/2009  . Rash and other nonspecific skin eruption 11/02/2007  . Senile osteoporosis 07/09/2006  . Shortness of breath 11/02/2007  . Unspecified constipation   . Unspecified essential hypertension 11/02/2007  . Unspecified hearing loss 12/05/2008  . Unspecified hypothyroidism 06/05/2005  . Unspecified vitamin D deficiency 02/03/2012   Past Surgical History:  Procedure Laterality Date  . BACK SURGERY  2006   Dr. Lequita HaltAluisio  . CATARACT EXTRACTION, BILATERAL    . KIDNEY STONE SURGERY  1959   Dr. Elige RadonBradley  . LAMINECTOMY  03/20/2005   Centtal with Bil L3 & L4 nerve root decompression  Dr. Otelia SergeantNitka  . SPINE SURGERY  1991   Dr. Otelia SergeantNitka  . SPINE SURGERY  1996   Dr. Salli RealNitak  . TOTAL KNEE ARTHROPLASTY Left 09/15/2002  . TOTAL KNEE ARTHROPLASTY  2000   Dr. Jerl Santosalldorf    Allergies  Allergen Reactions  . Codeine   . Darvocet [Propoxyphene N-Acetaminophen]   . Erythromycin   . Hydantoins   . Lyrica [Pregabalin]   . Morphine And Related   . Penicillins   . Percocet [Oxycodone-Acetaminophen]   . Quaternium-15   . Sulfa Antibiotics   . Sulfites   . Tobramycin-Dexamethasone     Outpatient Encounter Medications as of 05/14/2018  Medication Sig  . acetaminophen (TYLENOL) 325 MG tablet Take 650 mg 2 (two) times daily by mouth.  . bisacodyl  (DULCOLAX) 10 MG suppository Place 10 mg rectally every 3 (three) days.   Marland Kitchen lactose free nutrition (BOOST) LIQD Take 237 mLs by mouth 2 (two) times daily between meals.  Marland Kitchen levothyroxine (SYNTHROID, LEVOTHROID) 75 MCG tablet Take 75 mcg by mouth daily before breakfast.  . LORazepam (ATIVAN) 0.5 MG tablet Take 0.5 mg by mouth 2 (two) times daily. And q 6 hrs prn  . Melatonin 5 MG TABS Take 5 mg by mouth at bedtime.  . polyethylene glycol powder (GLYCOLAX/MIRALAX) powder Take 17 g by mouth daily.  . QUEtiapine (SEROQUEL) 50 MG tablet Take 25 mg by mouth 2 (two) times daily. 25 mg in am and 50 mg in the pm  . sennosides-docusate sodium (SENOKOT-S) 8.6-50 MG tablet Take 1 tablet 2 (two) times daily by mouth.   . Valproate Sodium (DEPAKENE) 250 MG/5ML SOLN solution Take 250 mg by mouth 2 (two) times daily.   Marland Kitchen venlafaxine (EFFEXOR) 75 MG tablet Take 75 mg by mouth 2 (two) times daily.   No facility-administered encounter medications on file as of 05/14/2018.     Review of Systems  Unable to perform ROS: Dementia    Immunization History  Administered Date(s) Administered  . Influenza Inj Mdck Quad Pf 08/01/2016  . Influenza Whole 10/24/2011  . Influenza-Unspecified 07/27/2013, 07/19/2014, 07/27/2015, 08/07/2017  . PPD Test 06/19/2013  . Pneumococcal Conjugate-13 01/17/2015  . Pneumococcal Polysaccharide-23 09/26/2017  . Td 09/25/2017  . Zoster 05/05/2006  . Zoster Recombinat (Shingrix) 09/05/2017, 01/06/2018   Pertinent  Health Maintenance Due  Topic Date Due  . INFLUENZA VACCINE  05/14/2018  . DEXA SCAN  Completed  . PNA vac Low Risk Adult  Completed   Fall Risk  09/09/2017 08/17/2014  Falls in the past year? No No  Risk for fall due to : - History of fall(s)  Risk for fall due to: Comment - 09-24-2013 broke right shoulder   Functional Status Survey:    Vitals:   05/15/18 1001  Weight: 114 lb 12.8 oz (52.1 kg)   Body mass index is 21.69 kg/m.  Wt Readings from Last 3  Encounters:  05/15/18 114 lb 12.8 oz (52.1 kg)  04/06/18 117 lb 3.2 oz (53.2 kg)  01/27/18 117 lb (53.1 kg)   Physical Exam  Constitutional: No distress.  HENT:  Head: Normocephalic and atraumatic.  Neck: No JVD present. No thyromegaly present.  Cardiovascular: Normal rate and regular rhythm.  No murmur heard. Pulmonary/Chest: Effort normal and breath sounds normal. No respiratory distress. She has no wheezes.  Abdominal: Soft. Bowel sounds are normal.  Neurological: She is alert.  Mumbles, not able to f/c or answer q's  Skin: Skin is warm and dry. She is not diaphoretic.  Psychiatric: She has a normal mood and affect.  Nursing note and vitals reviewed.   Labs reviewed: No results for input(s): NA, K, CL, CO2, GLUCOSE, BUN, CREATININE, CALCIUM, MG, PHOS in the last 8760 hours. No results for input(s): AST, ALT, ALKPHOS, BILITOT,  PROT, ALBUMIN in the last 8760 hours. No results for input(s): WBC, NEUTROABS, HGB, HCT, MCV, PLT in the last 8760 hours. Lab Results  Component Value Date   TSH 0.44 11/17/2017   Lab Results  Component Value Date   HGBA1C 5.6 03/07/2016   Lab Results  Component Value Date   CHOL 211 (A) 09/02/2015   HDL 47 09/02/2015   LDLCALC 136 09/02/2015   TRIG 140 09/02/2015    Significant Diagnostic Results in last 30 days:  No results found.  Assessment/Plan  Dementia with behavioral disturbance Followed by hospice. Continues with occasional periods of agitation. Would continue Depakote at 250 mg BID, Serqouel 25 in the am and 50 in them, as well as scheduled and prn ativan. Continue comfort care only.  Essential hypertension Controlled  Hypothyroidism Continue synthroid 75 mcg qd  Depression due to dementia Continue Effextor 75 mg bid due to periods of anxiety/agitation  Chronic generalized pain H/o right shoulder pain, OA, lumbar laminectomy and cervicalgia. On scheduled tylenol, controlled   Weight loss Down 3 lbs since June. Continue  boost BID.     Family/ staff Communication:staff Labs/tests ordered:  NA

## 2018-05-15 NOTE — Assessment & Plan Note (Signed)
Down 3 lbs since June. Continue boost BID.

## 2018-05-15 NOTE — Assessment & Plan Note (Signed)
Followed by hospice. Continues with occasional periods of agitation. Would continue Depakote at 250 mg BID, Serqouel 25 in the am and 50 in them, as well as scheduled and prn ativan. Continue comfort care only.

## 2018-05-15 NOTE — Assessment & Plan Note (Signed)
Controlled.  

## 2018-05-18 DIAGNOSIS — E039 Hypothyroidism, unspecified: Secondary | ICD-10-CM | POA: Diagnosis not present

## 2018-05-18 DIAGNOSIS — E46 Unspecified protein-calorie malnutrition: Secondary | ICD-10-CM | POA: Diagnosis not present

## 2018-05-18 DIAGNOSIS — E785 Hyperlipidemia, unspecified: Secondary | ICD-10-CM | POA: Diagnosis not present

## 2018-05-18 DIAGNOSIS — G309 Alzheimer's disease, unspecified: Secondary | ICD-10-CM | POA: Diagnosis not present

## 2018-05-18 DIAGNOSIS — I10 Essential (primary) hypertension: Secondary | ICD-10-CM | POA: Diagnosis not present

## 2018-05-19 DIAGNOSIS — I10 Essential (primary) hypertension: Secondary | ICD-10-CM | POA: Diagnosis not present

## 2018-05-19 DIAGNOSIS — G309 Alzheimer's disease, unspecified: Secondary | ICD-10-CM | POA: Diagnosis not present

## 2018-05-19 DIAGNOSIS — E785 Hyperlipidemia, unspecified: Secondary | ICD-10-CM | POA: Diagnosis not present

## 2018-05-19 DIAGNOSIS — E039 Hypothyroidism, unspecified: Secondary | ICD-10-CM | POA: Diagnosis not present

## 2018-05-19 DIAGNOSIS — E46 Unspecified protein-calorie malnutrition: Secondary | ICD-10-CM | POA: Diagnosis not present

## 2018-05-28 DIAGNOSIS — I10 Essential (primary) hypertension: Secondary | ICD-10-CM | POA: Diagnosis not present

## 2018-05-28 DIAGNOSIS — E785 Hyperlipidemia, unspecified: Secondary | ICD-10-CM | POA: Diagnosis not present

## 2018-05-28 DIAGNOSIS — G309 Alzheimer's disease, unspecified: Secondary | ICD-10-CM | POA: Diagnosis not present

## 2018-05-28 DIAGNOSIS — E46 Unspecified protein-calorie malnutrition: Secondary | ICD-10-CM | POA: Diagnosis not present

## 2018-05-28 DIAGNOSIS — E039 Hypothyroidism, unspecified: Secondary | ICD-10-CM | POA: Diagnosis not present

## 2018-06-01 DIAGNOSIS — E46 Unspecified protein-calorie malnutrition: Secondary | ICD-10-CM | POA: Diagnosis not present

## 2018-06-01 DIAGNOSIS — G309 Alzheimer's disease, unspecified: Secondary | ICD-10-CM | POA: Diagnosis not present

## 2018-06-01 DIAGNOSIS — E785 Hyperlipidemia, unspecified: Secondary | ICD-10-CM | POA: Diagnosis not present

## 2018-06-01 DIAGNOSIS — E039 Hypothyroidism, unspecified: Secondary | ICD-10-CM | POA: Diagnosis not present

## 2018-06-01 DIAGNOSIS — I10 Essential (primary) hypertension: Secondary | ICD-10-CM | POA: Diagnosis not present

## 2018-06-02 DIAGNOSIS — E46 Unspecified protein-calorie malnutrition: Secondary | ICD-10-CM | POA: Diagnosis not present

## 2018-06-02 DIAGNOSIS — E785 Hyperlipidemia, unspecified: Secondary | ICD-10-CM | POA: Diagnosis not present

## 2018-06-02 DIAGNOSIS — I10 Essential (primary) hypertension: Secondary | ICD-10-CM | POA: Diagnosis not present

## 2018-06-02 DIAGNOSIS — E039 Hypothyroidism, unspecified: Secondary | ICD-10-CM | POA: Diagnosis not present

## 2018-06-02 DIAGNOSIS — G309 Alzheimer's disease, unspecified: Secondary | ICD-10-CM | POA: Diagnosis not present

## 2018-06-11 DIAGNOSIS — G309 Alzheimer's disease, unspecified: Secondary | ICD-10-CM | POA: Diagnosis not present

## 2018-06-11 DIAGNOSIS — E039 Hypothyroidism, unspecified: Secondary | ICD-10-CM | POA: Diagnosis not present

## 2018-06-11 DIAGNOSIS — E785 Hyperlipidemia, unspecified: Secondary | ICD-10-CM | POA: Diagnosis not present

## 2018-06-11 DIAGNOSIS — I10 Essential (primary) hypertension: Secondary | ICD-10-CM | POA: Diagnosis not present

## 2018-06-11 DIAGNOSIS — E46 Unspecified protein-calorie malnutrition: Secondary | ICD-10-CM | POA: Diagnosis not present

## 2018-06-14 DIAGNOSIS — E039 Hypothyroidism, unspecified: Secondary | ICD-10-CM | POA: Diagnosis not present

## 2018-06-14 DIAGNOSIS — G309 Alzheimer's disease, unspecified: Secondary | ICD-10-CM | POA: Diagnosis not present

## 2018-06-14 DIAGNOSIS — E785 Hyperlipidemia, unspecified: Secondary | ICD-10-CM | POA: Diagnosis not present

## 2018-06-14 DIAGNOSIS — E46 Unspecified protein-calorie malnutrition: Secondary | ICD-10-CM | POA: Diagnosis not present

## 2018-06-14 DIAGNOSIS — I10 Essential (primary) hypertension: Secondary | ICD-10-CM | POA: Diagnosis not present

## 2018-06-15 DIAGNOSIS — E785 Hyperlipidemia, unspecified: Secondary | ICD-10-CM | POA: Diagnosis not present

## 2018-06-15 DIAGNOSIS — E46 Unspecified protein-calorie malnutrition: Secondary | ICD-10-CM | POA: Diagnosis not present

## 2018-06-15 DIAGNOSIS — G309 Alzheimer's disease, unspecified: Secondary | ICD-10-CM | POA: Diagnosis not present

## 2018-06-15 DIAGNOSIS — I10 Essential (primary) hypertension: Secondary | ICD-10-CM | POA: Diagnosis not present

## 2018-06-15 DIAGNOSIS — E039 Hypothyroidism, unspecified: Secondary | ICD-10-CM | POA: Diagnosis not present

## 2018-06-18 DIAGNOSIS — E785 Hyperlipidemia, unspecified: Secondary | ICD-10-CM | POA: Diagnosis not present

## 2018-06-18 DIAGNOSIS — G309 Alzheimer's disease, unspecified: Secondary | ICD-10-CM | POA: Diagnosis not present

## 2018-06-18 DIAGNOSIS — E039 Hypothyroidism, unspecified: Secondary | ICD-10-CM | POA: Diagnosis not present

## 2018-06-18 DIAGNOSIS — E46 Unspecified protein-calorie malnutrition: Secondary | ICD-10-CM | POA: Diagnosis not present

## 2018-06-18 DIAGNOSIS — I10 Essential (primary) hypertension: Secondary | ICD-10-CM | POA: Diagnosis not present

## 2018-06-22 DIAGNOSIS — G309 Alzheimer's disease, unspecified: Secondary | ICD-10-CM | POA: Diagnosis not present

## 2018-06-22 DIAGNOSIS — E785 Hyperlipidemia, unspecified: Secondary | ICD-10-CM | POA: Diagnosis not present

## 2018-06-22 DIAGNOSIS — E039 Hypothyroidism, unspecified: Secondary | ICD-10-CM | POA: Diagnosis not present

## 2018-06-22 DIAGNOSIS — I10 Essential (primary) hypertension: Secondary | ICD-10-CM | POA: Diagnosis not present

## 2018-06-22 DIAGNOSIS — E46 Unspecified protein-calorie malnutrition: Secondary | ICD-10-CM | POA: Diagnosis not present

## 2018-06-25 DIAGNOSIS — I10 Essential (primary) hypertension: Secondary | ICD-10-CM | POA: Diagnosis not present

## 2018-06-25 DIAGNOSIS — G309 Alzheimer's disease, unspecified: Secondary | ICD-10-CM | POA: Diagnosis not present

## 2018-06-25 DIAGNOSIS — E039 Hypothyroidism, unspecified: Secondary | ICD-10-CM | POA: Diagnosis not present

## 2018-06-25 DIAGNOSIS — E785 Hyperlipidemia, unspecified: Secondary | ICD-10-CM | POA: Diagnosis not present

## 2018-06-25 DIAGNOSIS — E46 Unspecified protein-calorie malnutrition: Secondary | ICD-10-CM | POA: Diagnosis not present

## 2018-06-29 ENCOUNTER — Non-Acute Institutional Stay (SKILLED_NURSING_FACILITY): Payer: Medicare Other | Admitting: Adult Health

## 2018-06-29 DIAGNOSIS — E785 Hyperlipidemia, unspecified: Secondary | ICD-10-CM | POA: Diagnosis not present

## 2018-06-29 DIAGNOSIS — F329 Major depressive disorder, single episode, unspecified: Secondary | ICD-10-CM | POA: Diagnosis not present

## 2018-06-29 DIAGNOSIS — E039 Hypothyroidism, unspecified: Secondary | ICD-10-CM | POA: Diagnosis not present

## 2018-06-29 DIAGNOSIS — R634 Abnormal weight loss: Secondary | ICD-10-CM | POA: Diagnosis not present

## 2018-06-29 DIAGNOSIS — F0393 Unspecified dementia, unspecified severity, with mood disturbance: Secondary | ICD-10-CM

## 2018-06-29 DIAGNOSIS — G309 Alzheimer's disease, unspecified: Secondary | ICD-10-CM | POA: Diagnosis not present

## 2018-06-29 DIAGNOSIS — F0281 Dementia in other diseases classified elsewhere with behavioral disturbance: Secondary | ICD-10-CM

## 2018-06-29 DIAGNOSIS — E038 Other specified hypothyroidism: Secondary | ICD-10-CM | POA: Diagnosis not present

## 2018-06-29 DIAGNOSIS — E559 Vitamin D deficiency, unspecified: Secondary | ICD-10-CM | POA: Diagnosis not present

## 2018-06-29 DIAGNOSIS — F0280 Dementia in other diseases classified elsewhere without behavioral disturbance: Secondary | ICD-10-CM | POA: Diagnosis not present

## 2018-06-29 DIAGNOSIS — E46 Unspecified protein-calorie malnutrition: Secondary | ICD-10-CM | POA: Diagnosis not present

## 2018-06-29 DIAGNOSIS — K5901 Slow transit constipation: Secondary | ICD-10-CM | POA: Diagnosis not present

## 2018-06-29 DIAGNOSIS — I10 Essential (primary) hypertension: Secondary | ICD-10-CM | POA: Diagnosis not present

## 2018-06-29 DIAGNOSIS — G301 Alzheimer's disease with late onset: Secondary | ICD-10-CM

## 2018-07-03 ENCOUNTER — Encounter: Payer: Self-pay | Admitting: Adult Health

## 2018-07-03 NOTE — Progress Notes (Signed)
Location:  Medical illustrator of Service:  SNF (31) Provider:   Peggye Ley, ANP [facility] [phone]   Kermit Balo, DO  Patient Care Team: Kermit Balo, DO as PCP - General (Geriatric Medicine) [facility], [facility] Venancio Poisson, Guy Sandifer, MD as Consulting Physician (Orthopedic Surgery) Archer Asa, MD as Consulting Physician (Psychiatry)  Extended Emergency Contact Information Primary Emergency Contact: [name],[name] Address: [phone]          [place] [address] [place] Home Phone: [phone] Mobile Phone: [phone] Relation: None  Code Status:  DNR Goals of care: Advanced Directive information Advanced Directives 01/27/2018  Does Patient Have a Medical Advance Directive? Yes  Type of Estate agent of [place];Living will;Out of facility DNR (pink MOST or yellow form)  Does patient want to make changes to medical advance directive? No - Patient declined  Copy of Healthcare Power of Attorney in Chart? Yes  Would patient like information on creating a medical advance directive? -  Pre-existing out of facility DNR order (yellow form or pink MOST form) Yellow form placed in chart (order not valid for inpatient use)     Chief Complaint  Patient presents with  . Medical Management of Chronic Issues    HPI:  Pt is a 84 y.o. female seen today for medical management of chronic diseases.    AD: severe with minimal verbalizations and dependency on staff for all ADLs Has less periods of agitation per staff. Dep level 29 on 6/25  Dysphagia: currently on D2 with ground meats and tolerating well, no recent bouts of pna  Depression: smiles during my visit, no reports of crying or worsening agitation  Hypothyroidism: Lab Results  Component Value Date   TSH 0.44 11/17/2017   Constipation: regular BMS with miralax and senokot  Weight loss: progressive weight loss over the  past yr due to decreased intake  Wt Readings from Last 3 Encounters:  07/03/18 115 lb 6.4 oz (52.3 kg)  05/15/18 114 lb 12.8 oz (52.1 kg)  04/06/18 117 lb 3.2 oz (53.2 kg)    Functional status: incontinent and hoyer lift  Past Medical History:  Diagnosis Date  . Backache, unspecified 04/10/2009  . Cervicalgia 09/12/2005  . Closed fracture of unspecified part of upper end of humerus 09/28/2013   Right proximal humerus fracture 09/24/2013  . Delusion (HCC) 06/04/2013  . Herpes zoster with other nervous system complications(053.19) 09/12/2008  . Memory deficit   . Other and unspecified hyperlipidemia 07/10/2009  . Other malaise and fatigue 10/09/2009  . Rash and other nonspecific skin eruption 11/02/2007  . Senile osteoporosis 07/09/2006  . Shortness of breath 11/02/2007  . Unspecified constipation   . Unspecified essential hypertension 11/02/2007  . Unspecified hearing loss 12/05/2008  . Unspecified hypothyroidism 06/05/2005  . Unspecified vitamin D deficiency 02/03/2012   Past Surgical History:  Procedure Laterality Date  . BACK SURGERY  2006   Dr. Lequita Halt  . CATARACT EXTRACTION, BILATERAL    . KIDNEY STONE SURGERY  1959   Dr. Elige Radon  . LAMINECTOMY  03/20/2005   Centtal with Bil L3 & L4 nerve root decompression  Dr. Otelia Sergeant  . SPINE SURGERY  1991   Dr. Otelia Sergeant  . SPINE SURGERY  1996   Dr. Salli Real  . TOTAL KNEE ARTHROPLASTY Left 09/15/2002  . TOTAL KNEE ARTHROPLASTY  2000   Dr. Jerl Santos    Allergies  Allergen Reactions  . Codeine   . Darvocet [  Propoxyphene N-Acetaminophen]   . Erythromycin   . Hydantoins   . Lyrica [Pregabalin]   . Morphine And Related   . Penicillins   . Percocet [Oxycodone-Acetaminophen]   . Quaternium-15   . Sulfa Antibiotics   . Sulfites   . Tobramycin-Dexamethasone     Outpatient Encounter Medications as of 06/29/2018  Medication Sig  . acetaminophen (TYLENOL) 325 MG tablet Take 650 mg 2 (two) times daily by mouth.  . bisacodyl (DULCOLAX) 10 MG  suppository Place 10 mg rectally every 3 (three) days.   Marland Kitchen lactose free nutrition (BOOST) LIQD Take 237 mLs by mouth 2 (two) times daily between meals.  Marland Kitchen levothyroxine (SYNTHROID, LEVOTHROID) 75 MCG tablet Take 75 mcg by mouth daily before breakfast.  . LORazepam (ATIVAN) 0.5 MG tablet Take 0.5 mg by mouth 2 (two) times daily. And q 6 hrs prn  . Melatonin 5 MG TABS Take 5 mg by mouth at bedtime.  . polyethylene glycol powder (GLYCOLAX/MIRALAX) powder Take 17 g by mouth daily.  . QUEtiapine (SEROQUEL) 50 MG tablet Take 25 mg by mouth 2 (two) times daily. 25 mg in am and 50 mg in the pm  . sennosides-docusate sodium (SENOKOT-S) 8.6-50 MG tablet Take 1 tablet 2 (two) times daily by mouth.   . Valproate Sodium (DEPAKENE) 250 MG/5ML SOLN solution Take 250 mg by mouth 2 (two) times daily.   Marland Kitchen venlafaxine (EFFEXOR) 75 MG tablet Take 75 mg by mouth 2 (two) times daily.   No facility-administered encounter medications on file as of 06/29/2018.     Review of Systems  Unable to perform ROS: Dementia    Immunization History  Administered Date(s) Administered  . Influenza Inj Mdck Quad Pf 08/01/2016  . Influenza Whole 10/24/2011  . Influenza-Unspecified 07/27/2013, 07/19/2014, 07/27/2015, 08/07/2017  . PPD Test 06/19/2013  . Pneumococcal Conjugate-13 01/17/2015  . Pneumococcal Polysaccharide-23 09/26/2017  . Td 09/25/2017  . Zoster 05/05/2006  . Zoster Recombinat (Shingrix) 09/05/2017, 01/06/2018   Pertinent  Health Maintenance Due  Topic Date Due  . INFLUENZA VACCINE  05/14/2018  . DEXA SCAN  Completed  . PNA vac Low Risk Adult  Completed   Fall Risk  09/09/2017 08/17/2014  Falls in the past year? No No  Risk for fall due to : - History of fall(s)  Risk for fall due to: Comment - 09-24-2013 broke right shoulder   Functional Status Survey:    Vitals:   07/03/18 1358  Weight: 115 lb 6.4 oz (52.3 kg)   Body mass index is 21.8 kg/m. Physical Exam  Constitutional: No distress.    Frail thin white female  HENT:  Head: Normocephalic and atraumatic.  Neck: No JVD present.  Cardiovascular: Normal rate and regular rhythm.  No murmur heard. Pulmonary/Chest: Effort normal and breath sounds normal. No respiratory distress. She has no wheezes.  Abdominal: Soft. Bowel sounds are normal.  Musculoskeletal: She exhibits deformity (severe kyphosis). She exhibits no edema.  Neurological: She is alert.  Smile and mumbles but not able to f/c or answer q's  Skin: Skin is warm and dry. She is not diaphoretic.  Psychiatric: She has a normal mood and affect.    Labs reviewed: No results for input(s): NA, K, CL, CO2, GLUCOSE, BUN, CREATININE, CALCIUM, MG, PHOS in the last 8760 hours. No results for input(s): AST, ALT, ALKPHOS, BILITOT, PROT, ALBUMIN in the last 8760 hours. No results for input(s): WBC, NEUTROABS, HGB, HCT, MCV, PLT in the last 8760 hours. Lab Results  Component Value Date  TSH 0.44 11/17/2017   Lab Results  Component Value Date   HGBA1C 5.6 03/07/2016   Lab Results  Component Value Date   CHOL 211 (A) 09/02/2015   HDL 47 09/02/2015   LDLCALC 136 09/02/2015   TRIG 140 09/02/2015    Significant Diagnostic Results in last 30 days:  No results found.  Assessment/Plan  1. Late onset Alzheimer's disease with behavioral disturbance Comfort care and followed by hospice Behaviors are stable on depakote, ativan and seroquel Check levels q 6 months  2. Other specified hypothyroidism Continue 75 mcg qd and check TSH annually  3. Depression due to dementia Less periods of crying recently per staff Continue Effexor 75 mg BID  4. Slow transit constipation Controlled, continue miralax and senokot s  5. Weight loss Progressive weight loss due to dementia and dysphagia Continue boost bid  6.  Dysphagia Continue D2 ground meat diet     Family/ staff Communication: staff  Labs/tests ordered:  NA

## 2018-07-06 DIAGNOSIS — E785 Hyperlipidemia, unspecified: Secondary | ICD-10-CM | POA: Diagnosis not present

## 2018-07-06 DIAGNOSIS — E039 Hypothyroidism, unspecified: Secondary | ICD-10-CM | POA: Diagnosis not present

## 2018-07-06 DIAGNOSIS — I10 Essential (primary) hypertension: Secondary | ICD-10-CM | POA: Diagnosis not present

## 2018-07-06 DIAGNOSIS — E46 Unspecified protein-calorie malnutrition: Secondary | ICD-10-CM | POA: Diagnosis not present

## 2018-07-06 DIAGNOSIS — G309 Alzheimer's disease, unspecified: Secondary | ICD-10-CM | POA: Diagnosis not present

## 2018-07-10 DIAGNOSIS — I10 Essential (primary) hypertension: Secondary | ICD-10-CM | POA: Diagnosis not present

## 2018-07-10 DIAGNOSIS — E785 Hyperlipidemia, unspecified: Secondary | ICD-10-CM | POA: Diagnosis not present

## 2018-07-10 DIAGNOSIS — E039 Hypothyroidism, unspecified: Secondary | ICD-10-CM | POA: Diagnosis not present

## 2018-07-10 DIAGNOSIS — G309 Alzheimer's disease, unspecified: Secondary | ICD-10-CM | POA: Diagnosis not present

## 2018-07-10 DIAGNOSIS — E46 Unspecified protein-calorie malnutrition: Secondary | ICD-10-CM | POA: Diagnosis not present

## 2018-07-14 DIAGNOSIS — E46 Unspecified protein-calorie malnutrition: Secondary | ICD-10-CM | POA: Diagnosis not present

## 2018-07-14 DIAGNOSIS — I10 Essential (primary) hypertension: Secondary | ICD-10-CM | POA: Diagnosis not present

## 2018-07-14 DIAGNOSIS — E039 Hypothyroidism, unspecified: Secondary | ICD-10-CM | POA: Diagnosis not present

## 2018-07-14 DIAGNOSIS — G309 Alzheimer's disease, unspecified: Secondary | ICD-10-CM | POA: Diagnosis not present

## 2018-07-14 DIAGNOSIS — E785 Hyperlipidemia, unspecified: Secondary | ICD-10-CM | POA: Diagnosis not present

## 2018-07-16 DIAGNOSIS — E46 Unspecified protein-calorie malnutrition: Secondary | ICD-10-CM | POA: Diagnosis not present

## 2018-07-16 DIAGNOSIS — E785 Hyperlipidemia, unspecified: Secondary | ICD-10-CM | POA: Diagnosis not present

## 2018-07-16 DIAGNOSIS — I10 Essential (primary) hypertension: Secondary | ICD-10-CM | POA: Diagnosis not present

## 2018-07-16 DIAGNOSIS — E039 Hypothyroidism, unspecified: Secondary | ICD-10-CM | POA: Diagnosis not present

## 2018-07-16 DIAGNOSIS — G309 Alzheimer's disease, unspecified: Secondary | ICD-10-CM | POA: Diagnosis not present

## 2018-07-20 DIAGNOSIS — E46 Unspecified protein-calorie malnutrition: Secondary | ICD-10-CM | POA: Diagnosis not present

## 2018-07-20 DIAGNOSIS — I10 Essential (primary) hypertension: Secondary | ICD-10-CM | POA: Diagnosis not present

## 2018-07-20 DIAGNOSIS — G309 Alzheimer's disease, unspecified: Secondary | ICD-10-CM | POA: Diagnosis not present

## 2018-07-20 DIAGNOSIS — E039 Hypothyroidism, unspecified: Secondary | ICD-10-CM | POA: Diagnosis not present

## 2018-07-20 DIAGNOSIS — E785 Hyperlipidemia, unspecified: Secondary | ICD-10-CM | POA: Diagnosis not present

## 2018-07-27 DIAGNOSIS — E46 Unspecified protein-calorie malnutrition: Secondary | ICD-10-CM | POA: Diagnosis not present

## 2018-07-27 DIAGNOSIS — E785 Hyperlipidemia, unspecified: Secondary | ICD-10-CM | POA: Diagnosis not present

## 2018-07-27 DIAGNOSIS — I10 Essential (primary) hypertension: Secondary | ICD-10-CM | POA: Diagnosis not present

## 2018-07-27 DIAGNOSIS — E039 Hypothyroidism, unspecified: Secondary | ICD-10-CM | POA: Diagnosis not present

## 2018-07-27 DIAGNOSIS — G309 Alzheimer's disease, unspecified: Secondary | ICD-10-CM | POA: Diagnosis not present

## 2018-08-03 DIAGNOSIS — E785 Hyperlipidemia, unspecified: Secondary | ICD-10-CM | POA: Diagnosis not present

## 2018-08-03 DIAGNOSIS — G309 Alzheimer's disease, unspecified: Secondary | ICD-10-CM | POA: Diagnosis not present

## 2018-08-03 DIAGNOSIS — E039 Hypothyroidism, unspecified: Secondary | ICD-10-CM | POA: Diagnosis not present

## 2018-08-03 DIAGNOSIS — E46 Unspecified protein-calorie malnutrition: Secondary | ICD-10-CM | POA: Diagnosis not present

## 2018-08-03 DIAGNOSIS — I10 Essential (primary) hypertension: Secondary | ICD-10-CM | POA: Diagnosis not present

## 2018-08-04 ENCOUNTER — Encounter: Payer: Self-pay | Admitting: Internal Medicine

## 2018-08-04 ENCOUNTER — Non-Acute Institutional Stay (SKILLED_NURSING_FACILITY): Payer: Medicare Other | Admitting: Internal Medicine

## 2018-08-04 DIAGNOSIS — F0280 Dementia in other diseases classified elsewhere without behavioral disturbance: Secondary | ICD-10-CM | POA: Diagnosis not present

## 2018-08-04 DIAGNOSIS — G301 Alzheimer's disease with late onset: Secondary | ICD-10-CM | POA: Diagnosis not present

## 2018-08-04 DIAGNOSIS — F0281 Dementia in other diseases classified elsewhere with behavioral disturbance: Secondary | ICD-10-CM

## 2018-08-04 DIAGNOSIS — F02818 Dementia in other diseases classified elsewhere, unspecified severity, with other behavioral disturbance: Secondary | ICD-10-CM

## 2018-08-04 DIAGNOSIS — F329 Major depressive disorder, single episode, unspecified: Secondary | ICD-10-CM | POA: Diagnosis not present

## 2018-08-04 DIAGNOSIS — R634 Abnormal weight loss: Secondary | ICD-10-CM

## 2018-08-04 DIAGNOSIS — K5901 Slow transit constipation: Secondary | ICD-10-CM | POA: Diagnosis not present

## 2018-08-04 DIAGNOSIS — E038 Other specified hypothyroidism: Secondary | ICD-10-CM | POA: Diagnosis not present

## 2018-08-04 DIAGNOSIS — M810 Age-related osteoporosis without current pathological fracture: Secondary | ICD-10-CM | POA: Diagnosis not present

## 2018-08-04 DIAGNOSIS — I10 Essential (primary) hypertension: Secondary | ICD-10-CM

## 2018-08-04 DIAGNOSIS — F32A Depression, unspecified: Secondary | ICD-10-CM

## 2018-08-04 DIAGNOSIS — F0393 Unspecified dementia, unspecified severity, with mood disturbance: Secondary | ICD-10-CM

## 2018-08-04 NOTE — Progress Notes (Signed)
Patient ID: [name], female   DOB: 1927-04-05, 84 y.o.   MRN: [id number]  Location:  [facility] Room Number: 127 Place of Service:  SNF ([phone]) Provider:   Kermit Balo, DO  Patient Care Team: Kermit Balo, DO as PCP - General (Geriatric Medicine) Community, Well Venancio Poisson, Guy Sandifer, MD as Consulting Physician (Orthopedic Surgery) Archer Asa, MD as Consulting Physician (Psychiatry)  Extended Emergency Contact Information Primary Emergency Contact: [name],[name] Address: [phone]          [place] [address] [place] Home Phone: [phone] Mobile Phone: [phone] Relation: None  Code Status:  DNR, MOST, hospice Goals of care: Advanced Directive information Advanced Directives 08/04/2018  Does Patient Have a Medical Advance Directive? Yes  Type of Estate agent of [place];Living will;Out of facility DNR (pink MOST or yellow form)  Does patient want to make changes to medical advance directive? No - Patient declined  Copy of Healthcare Power of Attorney in Chart? Yes  Would patient like information on creating a medical advance directive? -  Pre-existing out of facility DNR order (yellow form or pink MOST form) Yellow form placed in chart (order not valid for inpatient use);Pink MOST form placed in chart (order not valid for inpatient use)   Chief Complaint  Patient presents with  . Medical Management of Chronic Issues    Routine Visit    HPI:  Pt is a 84 y.o. female with h/o late onset AD with behaviors, hypothyroidism, depression due to her dementia, slow transit constipation, and weight loss seen today for medical management of chronic diseases.  Weight fairly stable past two months, but has trended down considerably for the past year--had been 133 lbs 11/18 and now 115 lbs today.  BMI remains in the normal range.  Resident moved to SNF from AL memory care in  June.  She'd been having crying episodes, but they have resolved.  She has advanced dementia, requires total ADL assist and hoyer lift.  She eats her breakfast and lunch, is usually falling asleep at dinner.  Had a crying spell the other day, but it was short and she was ok again.  She sleeps more in general.  She also takes spells of talking all day long occasionally.    Past Medical History:  Diagnosis Date  . Backache, unspecified 04/10/2009  . Cervicalgia 09/12/2005  . Closed fracture of unspecified part of upper end of humerus 09/28/2013   Right proximal humerus fracture 09/24/2013  . Delusion (HCC) 06/04/2013  . Herpes zoster with other nervous system complications(053.19) 09/12/2008  . Memory deficit   . Other and unspecified hyperlipidemia 07/10/2009  . Other malaise and fatigue 10/09/2009  . Rash and other nonspecific skin eruption 11/02/2007  . Senile osteoporosis 07/09/2006  . Shortness of breath 11/02/2007  . Unspecified constipation   . Unspecified essential hypertension 11/02/2007  . Unspecified hearing loss 12/05/2008  . Unspecified hypothyroidism 06/05/2005  . Unspecified vitamin D deficiency 02/03/2012   Past Surgical History:  Procedure Laterality Date  . BACK SURGERY  2006   Dr. Lequita Halt  . CATARACT EXTRACTION, BILATERAL    . KIDNEY STONE SURGERY  1959   Dr. Elige Radon  . LAMINECTOMY  03/20/2005   Centtal with Bil L3 & L4 nerve root decompression  Dr. Otelia Sergeant  . SPINE SURGERY  1991   Dr. Otelia Sergeant  . SPINE SURGERY  1996   Dr. Salli Real  . TOTAL KNEE ARTHROPLASTY  Left 09/15/2002  . TOTAL KNEE ARTHROPLASTY  2000   Dr. Jerl Santos    Allergies  Allergen Reactions  . Codeine   . Darvocet [Propoxyphene N-Acetaminophen]   . Erythromycin   . Hydantoins   . Lyrica [Pregabalin]   . Morphine And Related   . Penicillins   . Percocet [Oxycodone-Acetaminophen]   . Quaternium-15   . Sulfa Antibiotics   . Sulfites   . Tobramycin-Dexamethasone     Outpatient Encounter Medications as of  08/04/2018  Medication Sig  . acetaminophen (TYLENOL) 325 MG tablet Take 650 mg 2 (two) times daily by mouth.  . bisacodyl (DULCOLAX) 10 MG suppository Place 10 mg rectally every 3 (three) days.   Marland Kitchen lactose free nutrition (BOOST) LIQD Take 237 mLs by mouth 2 (two) times daily between meals.  Marland Kitchen levothyroxine (SYNTHROID, LEVOTHROID) 75 MCG tablet Take 75 mcg by mouth daily before breakfast.  . LORazepam (ATIVAN) 0.5 MG tablet Take 0.5 mg by mouth 2 (two) times daily.   . Melatonin 5 MG TABS Take 5 mg by mouth at bedtime.  . polyethylene glycol powder (GLYCOLAX/MIRALAX) powder Take 17 g by mouth daily.  . QUEtiapine (SEROQUEL) 50 MG tablet Take 25 mg by mouth 2 (two) times daily. 25 mg in am and 50 mg in the pm  . sennosides-docusate sodium (SENOKOT-S) 8.6-50 MG tablet Take 1 tablet 2 (two) times daily by mouth.   . Valproate Sodium (DEPAKENE) 250 MG/5ML SOLN solution Take 250 mg by mouth 2 (two) times daily.   Marland Kitchen venlafaxine (EFFEXOR) 75 MG tablet Take 75 mg by mouth 2 (two) times daily.   No facility-administered encounter medications on file as of 08/04/2018.     Review of Systems  Constitutional: Positive for activity change, appetite change, fatigue and unexpected weight change. Negative for chills and fever.  HENT: Negative for congestion.   Eyes: Negative for visual disturbance.  Respiratory: Negative for shortness of breath.   Cardiovascular: Negative for leg swelling.  Gastrointestinal: Positive for constipation. Negative for abdominal pain.  Genitourinary: Negative for dysuria.  Musculoskeletal: Positive for gait problem.  Skin: Negative for color change.  Neurological: Positive for weakness.  Psychiatric/Behavioral: Positive for behavioral problems and confusion. Negative for sleep disturbance.    Immunization History  Administered Date(s) Administered  . Influenza Inj Mdck Quad Pf 08/01/2016  . Influenza Whole 10/24/2011  . Influenza-Unspecified 07/27/2013, 07/19/2014,  07/27/2015, 08/07/2017  . PPD Test 06/19/2013  . Pneumococcal Conjugate-13 01/17/2015  . Pneumococcal Polysaccharide-23 09/26/2017  . Td 09/25/2017  . Zoster 05/05/2006  . Zoster Recombinat (Shingrix) 09/05/2017, 01/06/2018   Pertinent  Health Maintenance Due  Topic Date Due  . INFLUENZA VACCINE  05/14/2018  . DEXA SCAN  Completed  . PNA vac Low Risk Adult  Completed   Fall Risk  09/09/2017 08/17/2014  Falls in the past year? No No  Risk for fall due to : - History of fall(s)  Risk for fall due to: Comment - 09-24-2013 broke right shoulder   Functional Status Survey:  dependent, hoyer  Vitals:   08/04/18 1008  BP: 110/62  Pulse: 73  Resp: 18  Temp: 97.7 F (36.5 C)  TempSrc: Oral  SpO2: 91%  Weight: 115 lb (52.2 kg)  Height: 5\' 1"  (1.549 m)   Body mass index is 21.73 kg/m. Physical Exam  Constitutional: No distress.  Sitting in reclined chair with hand over her forehead  Eyes:  glasses  Cardiovascular: Normal rate, regular rhythm, normal heart sounds and intact distal pulses.  Pulmonary/Chest: Effort normal and breath sounds normal.  Abdominal: Bowel sounds are normal.  Musculoskeletal: She exhibits no tenderness.  Neurological:  Drowsy today resting in reclining chair  Skin: Skin is warm and dry.  Psychiatric: She has a normal mood and affect.    Labs reviewed: No results for input(s): NA, K, CL, CO2, GLUCOSE, BUN, CREATININE, CALCIUM, MG, PHOS in the last 8760 hours. No results for input(s): AST, ALT, ALKPHOS, BILITOT, PROT, ALBUMIN in the last 8760 hours. No results for input(s): WBC, NEUTROABS, HGB, HCT, MCV, PLT in the last 8760 hours. Lab Results  Component Value Date   TSH 0.44 11/17/2017   Lab Results  Component Value Date   HGBA1C 5.6 03/07/2016   Lab Results  Component Value Date   CHOL 211 (A) 09/02/2015   HDL 47 09/02/2015   LDLCALC 136 09/02/2015   TRIG 140 09/02/2015    Assessment/Plan 1. Late onset Alzheimer's disease with  behavioral disturbance (HCC) -no recent changes to speak of -cont full adl assist and hospice care  2. Other specified hypothyroidism -cont current levothyroxine dose--tsh at goal earlier this year  3. Depression due to dementia [facility]) -remains on chronic effexor therapy due to crying spells--these have improved some, also has seroquel for psychosis/hallucination attributable to depression and dementia; has ativan for anxiety; has been calm each week in the afternoons when I've been at the nurses' station seeing residents  4. Slow transit constipation -cont daily miralax, bid senokot s and bisacodyl supp q 3 d  5. Weight loss -fairly stable past few months, cont fully supervised meal assistance and aspiration precautions  6. Essential hypertension -no longer on bp meds, bp runs low now  7. Senile osteoporosis -no longer on medications for this as she is not ambulatory and requires lift for transfers  Family/ staff Communication: discussed with snf nurse  Labs/tests ordered:  No new  Kymani Shimabukuro L. Anaysha Andre, D.O. Geriatrics [facility] [address], [place] [address] Cell Phone (Mon-Fri 8am-5pm):  [phone] On Call:  [phone] & follow prompts after 5pm & weekends Office Phone:  [phone] Office Fax:  [phone]

## 2018-08-11 DIAGNOSIS — E785 Hyperlipidemia, unspecified: Secondary | ICD-10-CM | POA: Diagnosis not present

## 2018-08-11 DIAGNOSIS — G309 Alzheimer's disease, unspecified: Secondary | ICD-10-CM | POA: Diagnosis not present

## 2018-08-11 DIAGNOSIS — E039 Hypothyroidism, unspecified: Secondary | ICD-10-CM | POA: Diagnosis not present

## 2018-08-11 DIAGNOSIS — E46 Unspecified protein-calorie malnutrition: Secondary | ICD-10-CM | POA: Diagnosis not present

## 2018-08-11 DIAGNOSIS — I10 Essential (primary) hypertension: Secondary | ICD-10-CM | POA: Diagnosis not present

## 2018-08-24 ENCOUNTER — Non-Acute Institutional Stay (SKILLED_NURSING_FACILITY): Payer: Medicare Other | Admitting: Adult Health

## 2018-08-24 ENCOUNTER — Encounter: Payer: Self-pay | Admitting: Adult Health

## 2018-08-24 DIAGNOSIS — F02818 Dementia in other diseases classified elsewhere, unspecified severity, with other behavioral disturbance: Secondary | ICD-10-CM

## 2018-08-24 DIAGNOSIS — R5383 Other fatigue: Secondary | ICD-10-CM | POA: Diagnosis not present

## 2018-08-24 DIAGNOSIS — F0281 Dementia in other diseases classified elsewhere with behavioral disturbance: Secondary | ICD-10-CM | POA: Diagnosis not present

## 2018-08-24 DIAGNOSIS — G301 Alzheimer's disease with late onset: Secondary | ICD-10-CM

## 2018-08-24 DIAGNOSIS — I9589 Other hypotension: Secondary | ICD-10-CM | POA: Diagnosis not present

## 2018-08-24 DIAGNOSIS — E861 Hypovolemia: Secondary | ICD-10-CM

## 2018-08-24 MED ORDER — LORAZEPAM 2 MG/ML PO CONC: 0.5000 mg | Freq: Three times a day (TID) | ORAL | 1 refills | Status: AC

## 2018-08-24 NOTE — Progress Notes (Signed)
Location:  Medical illustrator of Service:  SNF (31) Provider:   Peggye Ley, ANP [facility] [phone]  Kermit Balo, DO  Patient Care Team: Kermit Balo, DO as PCP - General (Geriatric Medicine) Community, Well Venancio Poisson, Guy Sandifer, MD as Consulting Physician (Orthopedic Surgery) Archer Asa, MD as Consulting Physician (Psychiatry)  Extended Emergency Contact Information Primary Emergency Contact: [name],[name] Address: [phone]          [place] [address] [place] Home Phone: [phone] Mobile Phone: [phone] Relation: None  Code Status:  DNR Goals of care: Advanced Directive information Advanced Directives 08/04/2018  Does Patient Have a Medical Advance Directive? Yes  Type of Estate agent of [place];Living will;Out of facility DNR (pink MOST or yellow form)  Does patient want to make changes to medical advance directive? No - Patient declined  Copy of Healthcare Power of Attorney in Chart? Yes  Would patient like information on creating a medical advance directive? -  Pre-existing out of facility DNR order (yellow form or pink MOST form) Yellow form placed in chart (order not valid for inpatient use);Pink MOST form placed in chart (order not valid for inpatient use)     Chief Complaint  Patient presents with  . Acute Visit    lethargy, decreased intake, hypotension    HPI:  Pt is a 84 y.o. female seen today for an acute visit for lethargy, decreased intake, and hypotension. [name] has a hx of advanced dementia and is followed by hospice. The nurse reports that starting on 11/10 she developed lethargy and was not eating or drinking well. On 11/11 in the am she was not able to be aroused for morning meds and had flushed cheeks with a low grade temp of 99 axillary.  Later in the day her BP was rechecked and was 75/49.  She was not able to eat or drink this  morning but had a large void of urine and a medium bowel movement. There are no other reported symptoms such as cough, congestion, dysuria, etc.    Past Medical History:  Diagnosis Date  . Backache, unspecified 04/10/2009  . Cervicalgia 09/12/2005  . Closed fracture of unspecified part of upper end of humerus 09/28/2013   Right proximal humerus fracture 09/24/2013  . Delusion (HCC) 06/04/2013  . Herpes zoster with other nervous system complications(053.19) 09/12/2008  . Memory deficit   . Other and unspecified hyperlipidemia 07/10/2009  . Other malaise and fatigue 10/09/2009  . Rash and other nonspecific skin eruption 11/02/2007  . Senile osteoporosis 07/09/2006  . Shortness of breath 11/02/2007  . Unspecified constipation   . Unspecified essential hypertension 11/02/2007  . Unspecified hearing loss 12/05/2008  . Unspecified hypothyroidism 06/05/2005  . Unspecified vitamin D deficiency 02/03/2012   Past Surgical History:  Procedure Laterality Date  . BACK SURGERY  2006   Dr. Lequita Halt  . CATARACT EXTRACTION, BILATERAL    . KIDNEY STONE SURGERY  1959   Dr. Elige Radon  . LAMINECTOMY  03/20/2005   Centtal with Bil L3 & L4 nerve root decompression  Dr. Otelia Sergeant  . SPINE SURGERY  1991   Dr. Otelia Sergeant  . SPINE SURGERY  1996   Dr. Salli Real  . TOTAL KNEE ARTHROPLASTY Left 09/15/2002  . TOTAL KNEE ARTHROPLASTY  2000   Dr. Jerl Santos    Allergies  Allergen Reactions  . Codeine   . Darvocet [Propoxyphene N-Acetaminophen]   . Erythromycin   .  Hydantoins   . Lyrica [Pregabalin]   . Morphine And Related   . Penicillins   . Percocet [Oxycodone-Acetaminophen]   . Quaternium-15   . Sulfa Antibiotics   . Sulfites   . Tobramycin-Dexamethasone     Outpatient Encounter Medications as of 08/24/2018  Medication Sig  . LORazepam (ATIVAN) 2 MG/ML concentrated solution Take 0.3 mLs (0.6 mg total) by mouth every 8 (eight) hours.  . [DISCONTINUED] LORazepam (ATIVAN) 2 MG/ML concentrated solution Take 0.5 mg by  mouth every 8 (eight) hours.  Marland Kitchen acetaminophen (TYLENOL) 325 MG tablet Take 650 mg 2 (two) times daily by mouth.  . bisacodyl (DULCOLAX) 10 MG suppository Place 10 mg rectally every 3 (three) days.   Marland Kitchen lactose free nutrition (BOOST) LIQD Take 237 mLs by mouth 2 (two) times daily between meals.  Marland Kitchen levothyroxine (SYNTHROID, LEVOTHROID) 75 MCG tablet Take 75 mcg by mouth daily before breakfast.  . Melatonin 5 MG TABS Take 5 mg by mouth at bedtime.  . polyethylene glycol powder (GLYCOLAX/MIRALAX) powder Take 17 g by mouth daily.  . QUEtiapine (SEROQUEL) 50 MG tablet Take 25 mg by mouth 2 (two) times daily. 25 mg in am and 50 mg in the pm  . sennosides-docusate sodium (SENOKOT-S) 8.6-50 MG tablet Take 1 tablet 2 (two) times daily by mouth.   . Valproate Sodium (DEPAKENE) 250 MG/5ML SOLN solution Take 250 mg by mouth 2 (two) times daily.   Marland Kitchen venlafaxine (EFFEXOR) 75 MG tablet Take 75 mg by mouth 2 (two) times daily.  . [DISCONTINUED] LORazepam (ATIVAN) 0.5 MG tablet Take 0.5 mg by mouth 2 (two) times daily.    No facility-administered encounter medications on file as of 08/24/2018.     Review of Systems  Unable to perform ROS: Dementia    Immunization History  Administered Date(s) Administered  . Influenza Inj Mdck Quad Pf 08/01/2016  . Influenza Whole 10/24/2011  . Influenza,inj,Quad PF,6+ Mos 08/04/2018  . Influenza-Unspecified 07/27/2013, 07/19/2014, 07/27/2015, 08/07/2017  . PPD Test 06/19/2013  . Pneumococcal Conjugate-13 01/17/2015  . Pneumococcal Polysaccharide-23 09/26/2017  . Td 09/25/2017  . Zoster 05/05/2006  . Zoster Recombinat (Shingrix) 09/05/2017, 01/06/2018   Pertinent  Health Maintenance Due  Topic Date Due  . INFLUENZA VACCINE  Completed  . DEXA SCAN  Completed  . PNA vac Low Risk Adult  Completed   Fall Risk  09/09/2017 08/17/2014  Falls in the past year? No No  Risk for fall due to : - History of fall(s)  Risk for fall due to: Comment - 09-24-2013 broke right  shoulder   Functional Status Survey:    Vitals:   08/24/18 1220  BP: (!) 76/45  Pulse: (!) 59  Resp: (!) 22  Temp: 99 F (37.2 C)  SpO2: 90%   There is no height or weight on file to calculate BMI. Physical Exam  Constitutional: No distress.  HENT:  Head: Normocephalic and atraumatic.  Nose: Nose normal.  Mouth/Throat: No oropharyngeal exudate.  Very dry mouth  Eyes:  Refused eye exam  Neck: No JVD present.  Cardiovascular: Regular rhythm.  No murmur heard. Rate slow 59  Pulmonary/Chest: Effort normal and breath sounds normal. No respiratory distress. She has no wheezes.  Abdominal: Soft. Bowel sounds are normal. She exhibits no distension. There is no tenderness. There is no guarding.  Lymphadenopathy:    She has no cervical adenopathy.  Neurological:  Not verbal and not able to follow commands. Does not arouse to verbal or physical stimuli except small movements  to her limbs with sternal rub  Skin: Skin is warm and dry. She is not diaphoretic.  Flushed cheeks  Psychiatric:  lethargic  Nursing note and vitals reviewed.   Labs reviewed: No results for input(s): NA, K, CL, CO2, GLUCOSE, BUN, CREATININE, CALCIUM, MG, PHOS in the last 8760 hours. No results for input(s): AST, ALT, ALKPHOS, BILITOT, PROT, ALBUMIN in the last 8760 hours. No results for input(s): WBC, NEUTROABS, HGB, HCT, MCV, PLT in the last 8760 hours. Lab Results  Component Value Date   TSH 0.44 11/17/2017   Lab Results  Component Value Date   HGBA1C 5.6 03/07/2016   Lab Results  Component Value Date   CHOL 211 (A) 09/02/2015   HDL 47 09/02/2015   LDLCALC 136 09/02/2015   TRIG 140 09/02/2015    Significant Diagnostic Results in last 30 days:  No results found.  Assessment/Plan  1. Hypotension due to hypovolemia ?if she has dehydration and/or possible septic shock with low grade temp. See below.  2. Lethargy Likely due to underlying infection with underlying dementia Discontinue all  oral medications Staff to provide oral care  3. Late onset Alzheimer's disease with behavioral disturbance (HCC) She has had a slow and progressive decline in cognition and function c/w AD.  There have been times where she has had psychosis, crying episodes, and agitation and has required multiple drug regimen to control these behaviors. She is now no longer able to swallow due to lethargy/hypotension, possibly related to underlying infection/dehydration. I have ordered scheduled liquid ativan 0.5 mg TID for this reason. She does not appear to be in any pain or discomfort but tylenol supp were ordered 650 MG BID prn. She has multiple drug allergies. The staff confirmed with her daughter that her allergy to morphine and codeine is rash. We could give her morphine or oxycodone with benadryl if necessary but at this point she does not appear to need it. I called her daughter [name] and discussed her care. We agreed to provide comfort care only and avoid any other interventions. Her daughter is at work but will try stop by later today.     Family/ staff Communication: discussed with her daughter Rhona Raider ordered:  NA

## 2018-08-31 ENCOUNTER — Non-Acute Institutional Stay (SKILLED_NURSING_FACILITY): Payer: Medicare Other | Admitting: Adult Health

## 2018-08-31 ENCOUNTER — Other Ambulatory Visit: Payer: Self-pay | Admitting: Adult Health

## 2018-08-31 ENCOUNTER — Encounter: Payer: Self-pay | Admitting: Adult Health

## 2018-08-31 DIAGNOSIS — R52 Pain, unspecified: Secondary | ICD-10-CM | POA: Diagnosis not present

## 2018-08-31 DIAGNOSIS — F0281 Dementia in other diseases classified elsewhere with behavioral disturbance: Secondary | ICD-10-CM

## 2018-08-31 DIAGNOSIS — G8929 Other chronic pain: Secondary | ICD-10-CM | POA: Diagnosis not present

## 2018-08-31 DIAGNOSIS — R410 Disorientation, unspecified: Secondary | ICD-10-CM | POA: Diagnosis not present

## 2018-08-31 DIAGNOSIS — G301 Alzheimer's disease with late onset: Secondary | ICD-10-CM

## 2018-08-31 MED ORDER — MORPHINE SULFATE (CONCENTRATE) 20 MG/ML PO SOLN: 5.0000 mg | ORAL | 0 refills | Status: AC | PRN

## 2018-08-31 NOTE — Progress Notes (Signed)
Location:  Medical illustrator of Service:  SNF (31) Provider:   Peggye Ley, ANP [facility] [phone]   Kermit Balo, DO  Patient Care Team: Kermit Balo, DO as PCP - General (Geriatric Medicine) Community, Well Venancio Poisson, Guy Sandifer, MD as Consulting Physician (Orthopedic Surgery) Archer Asa, MD as Consulting Physician (Psychiatry)  Extended Emergency Contact Information Primary Emergency Contact: [name],[name] Address: [phone]          [place] [address] [place] Home Phone: [phone] Mobile Phone: [phone] Relation: None  Code Status:  DNR Goals of care: Advanced Directive information Advanced Directives 08/04/2018  Does Patient Have a Medical Advance Directive? Yes  Type of Estate agent of [place];Living will;Out of facility DNR (pink MOST or yellow form)  Does patient want to make changes to medical advance directive? No - Patient declined  Copy of Healthcare Power of Attorney in Chart? Yes  Would patient like information on creating a medical advance directive? -  Pre-existing out of facility DNR order (yellow form or pink MOST form) Yellow form placed in chart (order not valid for inpatient use);Pink MOST form placed in chart (order not valid for inpatient use)     Chief Complaint  Patient presents with  . Acute Visit    delirium    HPI:  Pt is a 84 y.o. female seen today for an acute visit for delirium. She has a hx of end stage AD and is followed by hospice. Seen on 11/11 due to fever and lethargy. Comfort care measures were implemented with scheduled ativan for agitation and other meds discontinued as she was not swallowing. Since that time she has had periods of moaning and roxanol was ordered and the ativan increased to 1 mg q 2 hrs prn. Benadryl was ordered prior to roxanol admin due to a hx of rash with morphine and codeine. The nurse reports  that she is moaning and crying frequently. She has some mottling to both feet and 1 episode of apnea 10 sec. They were not able to obtain her BP due to agitation.    Past Medical History:  Diagnosis Date  . Backache, unspecified 04/10/2009  . Cervicalgia 09/12/2005  . Closed fracture of unspecified part of upper end of humerus 09/28/2013   Right proximal humerus fracture 09/24/2013  . Delusion (HCC) 06/04/2013  . Herpes zoster with other nervous system complications(053.19) 09/12/2008  . Memory deficit   . Other and unspecified hyperlipidemia 07/10/2009  . Other malaise and fatigue 10/09/2009  . Rash and other nonspecific skin eruption 11/02/2007  . Senile osteoporosis 07/09/2006  . Shortness of breath 11/02/2007  . Unspecified constipation   . Unspecified essential hypertension 11/02/2007  . Unspecified hearing loss 12/05/2008  . Unspecified hypothyroidism 06/05/2005  . Unspecified vitamin D deficiency 02/03/2012   Past Surgical History:  Procedure Laterality Date  . BACK SURGERY  2006   Dr. Lequita Halt  . CATARACT EXTRACTION, BILATERAL    . KIDNEY STONE SURGERY  1959   Dr. Elige Radon  . LAMINECTOMY  03/20/2005   Centtal with Bil L3 & L4 nerve root decompression  Dr. Otelia Sergeant  . SPINE SURGERY  1991   Dr. Otelia Sergeant  . SPINE SURGERY  1996   Dr. Salli Real  . TOTAL KNEE ARTHROPLASTY Left 09/15/2002  . TOTAL KNEE ARTHROPLASTY  2000   Dr. Jerl Santos    Allergies  Allergen Reactions  . Codeine   . Darvocet [Propoxyphene N-Acetaminophen]   .  Erythromycin   . Hydantoins   . Lyrica [Pregabalin]   . Morphine And Related   . Penicillins   . Percocet [Oxycodone-Acetaminophen]   . Quaternium-15   . Sulfa Antibiotics   . Sulfites   . Tobramycin-Dexamethasone     Outpatient Encounter Medications as of 08/31/2018  Medication Sig  . acetaminophen (TYLENOL) 650 MG suppository Place 650 mg rectally 2 (two) times daily as needed.  . diphenhydrAMINE (BENADRYL) 25 MG tablet Take 25 mg by mouth every 4 (four)  hours as needed. Given 20 min prior to morphine admin  . LORazepam (ATIVAN) 2 MG/ML concentrated solution Take 1 mg by mouth every 2 (two) hours as needed for anxiety.  Marland Kitchen. morphine (ROXANOL) 20 MG/ML concentrated solution Take 5 mg by mouth every 4 (four) hours as needed for severe pain.  . bisacodyl (DULCOLAX) 10 MG suppository Place 10 mg rectally every 3 (three) days.   Marland Kitchen. LORazepam (ATIVAN) 2 MG/ML concentrated solution Take 0.3 mLs (0.6 mg total) by mouth every 8 (eight) hours.  . [DISCONTINUED] acetaminophen (TYLENOL) 325 MG tablet Take 650 mg 2 (two) times daily by mouth.  . [DISCONTINUED] lactose free nutrition (BOOST) LIQD Take 237 mLs by mouth 2 (two) times daily between meals.  . [DISCONTINUED] levothyroxine (SYNTHROID, LEVOTHROID) 75 MCG tablet Take 75 mcg by mouth daily before breakfast.  . [DISCONTINUED] Melatonin 5 MG TABS Take 5 mg by mouth at bedtime.  . [DISCONTINUED] polyethylene glycol powder (GLYCOLAX/MIRALAX) powder Take 17 g by mouth daily.  . [DISCONTINUED] QUEtiapine (SEROQUEL) 50 MG tablet Take 25 mg by mouth 2 (two) times daily. 25 mg in am and 50 mg in the pm  . [DISCONTINUED] sennosides-docusate sodium (SENOKOT-S) 8.6-50 MG tablet Take 1 tablet 2 (two) times daily by mouth.   . [DISCONTINUED] Valproate Sodium (DEPAKENE) 250 MG/5ML SOLN solution Take 250 mg by mouth 2 (two) times daily.   . [DISCONTINUED] venlafaxine (EFFEXOR) 75 MG tablet Take 75 mg by mouth 2 (two) times daily.   No facility-administered encounter medications on file as of 08/31/2018.     Review of Systems  Unable to perform ROS: Dementia    Immunization History  Administered Date(s) Administered  . Influenza Inj Mdck Quad Pf 08/01/2016  . Influenza Whole 10/24/2011  . Influenza,inj,Quad PF,6+ Mos 08/04/2018  . Influenza-Unspecified 07/27/2013, 07/19/2014, 07/27/2015, 08/07/2017  . PPD Test 06/19/2013  . Pneumococcal Conjugate-13 01/17/2015  . Pneumococcal Polysaccharide-23 09/26/2017  . Td  09/25/2017  . Zoster 05/05/2006  . Zoster Recombinat (Shingrix) 09/05/2017, 01/06/2018   Pertinent  Health Maintenance Due  Topic Date Due  . INFLUENZA VACCINE  Completed  . DEXA SCAN  Completed  . PNA vac Low Risk Adult  Completed   Fall Risk  09/09/2017 08/17/2014  Falls in the past year? No No  Risk for fall due to : - History of fall(s)  Risk for fall due to: Comment - 09-24-2013 broke right shoulder   Functional Status Survey:    Vitals:   08/31/18 1106  Pulse: 65  Resp: (!) 22  Temp: 99.1 F (37.3 C)   There is no height or weight on file to calculate BMI. Physical Exam  Constitutional: She appears distressed.  HENT:  Head: Normocephalic and atraumatic.  Cardiovascular: Normal rate and regular rhythm.  Pulmonary/Chest: Breath sounds normal.  10 sec period of apnea noted.   Abdominal: Soft. Bowel sounds are normal.  Neurological:  Eyes closed, moves eyebrows when her name is spoken. Can not follow commands.   Skin:  Skin is warm and dry. She is not diaphoretic.  Mottling to both feet  Psychiatric:  Crying and moaning  Nursing note and vitals reviewed.   Labs reviewed: No results for input(s): NA, K, CL, CO2, GLUCOSE, BUN, CREATININE, CALCIUM, MG, PHOS in the last 8760 hours. No results for input(s): AST, ALT, ALKPHOS, BILITOT, PROT, ALBUMIN in the last 8760 hours. No results for input(s): WBC, NEUTROABS, HGB, HCT, MCV, PLT in the last 8760 hours. Lab Results  Component Value Date   TSH 0.44 11/17/2017   Lab Results  Component Value Date   HGBA1C 5.6 03/07/2016   Lab Results  Component Value Date   CHOL 211 (A) 09/02/2015   HDL 47 09/02/2015   LDLCALC 136 09/02/2015   TRIG 140 09/02/2015    Significant Diagnostic Results in last 30 days:  No results found.  Assessment/Plan  1. Acute delirium Seroquel 50 mg BID scheduled crush and mix with a small amt of water under her tongue.  Continue scheduled ativan 0.5 mg q hrs and 1 mg q 2 hrs prn  2.  Late onset Alzheimer's disease with behavioral disturbance (HCC) End stage with progression through the dying process  3. Chronic generalized pain With a hx of DDD and multiple back surgeries Continue roxanol 5 mg q4 hrs prn with 25 mg of Benadryl prior to [employer]   Family/ staff Communication: Nurse Tammy and Hospice nurse Meghan to communicate with her daughter.   Labs/tests ordered:  NA

## 2018-09-13 DEATH — deceased
